# Patient Record
Sex: Male | Born: 1980 | Race: Black or African American | Hispanic: No | Marital: Married | Smoking: Never smoker
Health system: Southern US, Community
[De-identification: ages and names within clinical notes are randomized; demographics above are authoritative.]

## PROBLEM LIST (undated history)

## (undated) DIAGNOSIS — E785 Hyperlipidemia, unspecified: Secondary | ICD-10-CM

## (undated) DIAGNOSIS — J45909 Unspecified asthma, uncomplicated: Secondary | ICD-10-CM

## (undated) DIAGNOSIS — E119 Type 2 diabetes mellitus without complications: Secondary | ICD-10-CM

## (undated) DIAGNOSIS — Z9109 Other allergy status, other than to drugs and biological substances: Secondary | ICD-10-CM

## (undated) DIAGNOSIS — I10 Essential (primary) hypertension: Secondary | ICD-10-CM

## (undated) HISTORY — DX: Other allergy status, other than to drugs and biological substances: Z91.09

## (undated) HISTORY — DX: Unspecified asthma, uncomplicated: J45.909

## (undated) HISTORY — DX: Hyperlipidemia, unspecified: E78.5

## (undated) HISTORY — DX: Essential (primary) hypertension: I10

## (undated) HISTORY — DX: Type 2 diabetes mellitus without complications: E11.9

---

## 2005-01-18 ENCOUNTER — Emergency Department (HOSPITAL_COMMUNITY): Admission: EM | Admit: 2005-01-18 | Discharge: 2005-01-18 | Payer: Self-pay | Admitting: Emergency Medicine

## 2011-03-01 ENCOUNTER — Ambulatory Visit (HOSPITAL_COMMUNITY)
Admission: RE | Admit: 2011-03-01 | Discharge: 2011-03-01 | Disposition: A | Discharge status: Home / Self Care | Payer: BC Managed Care – PPO | Source: Ambulatory Visit | Attending: Obstetrics and Gynecology | Admitting: Obstetrics and Gynecology

## 2011-03-01 DIAGNOSIS — IMO0002 Reserved for concepts with insufficient information to code with codable children: Secondary | ICD-10-CM | POA: Insufficient documentation

## 2011-03-02 ENCOUNTER — Other Ambulatory Visit: Payer: Self-pay | Admitting: Maternal and Fetal Medicine

## 2012-02-07 ENCOUNTER — Ambulatory Visit (INDEPENDENT_AMBULATORY_CARE_PROVIDER_SITE_OTHER): Payer: BC Managed Care – PPO | Admitting: Internal Medicine

## 2012-02-07 ENCOUNTER — Encounter: Payer: Self-pay | Admitting: Internal Medicine

## 2012-02-07 ENCOUNTER — Other Ambulatory Visit (INDEPENDENT_AMBULATORY_CARE_PROVIDER_SITE_OTHER): Payer: BC Managed Care – PPO

## 2012-02-07 VITALS — BP 128/70 | HR 76 | Temp 98.3°F | Resp 16 | Ht 77.0 in | Wt 380.0 lb

## 2012-02-07 DIAGNOSIS — G4733 Obstructive sleep apnea (adult) (pediatric): Secondary | ICD-10-CM | POA: Insufficient documentation

## 2012-02-07 DIAGNOSIS — Z0000 Encounter for general adult medical examination without abnormal findings: Secondary | ICD-10-CM | POA: Insufficient documentation

## 2012-02-07 DIAGNOSIS — Z23 Encounter for immunization: Secondary | ICD-10-CM

## 2012-02-07 DIAGNOSIS — R0683 Snoring: Secondary | ICD-10-CM

## 2012-02-07 DIAGNOSIS — J45998 Other asthma: Secondary | ICD-10-CM | POA: Insufficient documentation

## 2012-02-07 LAB — COMPREHENSIVE METABOLIC PANEL
AST: 22 U/L (ref 0–37)
Albumin: 3.8 g/dL (ref 3.5–5.2)
Alkaline Phosphatase: 93 U/L (ref 39–117)
BUN: 11 mg/dL (ref 6–23)
Potassium: 3.7 mEq/L (ref 3.5–5.1)
Total Bilirubin: 0.5 mg/dL (ref 0.3–1.2)

## 2012-02-07 LAB — LIPID PANEL
Cholesterol: 208 mg/dL — ABNORMAL HIGH (ref 0–200)
HDL: 35 mg/dL — ABNORMAL LOW (ref >39.00)
VLDL: 29.4 mg/dL (ref 0.0–40.0)

## 2012-02-07 LAB — CBC WITH DIFFERENTIAL/PLATELET
Basophils Relative: 0.5 % (ref 0.0–3.0)
Eosinophils Absolute: 0.1 10*3/uL (ref 0.0–0.7)
MCHC: 32.8 g/dL (ref 30.0–36.0)
MCV: 82.6 fl (ref 78.0–100.0)
Monocytes Absolute: 0.5 10*3/uL (ref 0.1–1.0)
Neutrophils Relative %: 56.8 % (ref 43.0–77.0)
Platelets: 260 10*3/uL (ref 150.0–400.0)
RBC: 5.05 Mil/uL (ref 4.22–5.81)

## 2012-02-07 NOTE — Assessment & Plan Note (Signed)
Exam done, vaccines were updated, labs were ordered, pt ed material was given 

## 2012-02-07 NOTE — Assessment & Plan Note (Signed)
pulm referral ? The need for a sleep study

## 2012-02-07 NOTE — Patient Instructions (Addendum)
Health Maintenance, Males A healthy lifestyle and preventative care can promote health and wellness.  Maintain regular health, dental, and eye exams.   Eat a healthy diet. Foods like vegetables, fruits, whole grains, low-fat dairy products, and lean protein foods contain the nutrients you need without too many calories. Decrease your intake of foods high in solid fats, added sugars, and salt. Get information about a proper diet from your caregiver, if necessary.   Regular physical exercise is one of the most important things you can do for your health. Most adults should get at least 150 minutes of moderate-intensity exercise (any activity that increases your heart rate and causes you to sweat) each week. In addition, most adults need muscle-strengthening exercises on 2 or more days a week.    Maintain a healthy weight. The body mass index (BMI) is a screening tool to identify possible weight problems. It provides an estimate of body fat based on height and weight. Your caregiver can help determine your BMI, and can help you achieve or maintain a healthy weight. For adults 20 years and older:   A BMI below 18.5 is considered underweight.   A BMI of 18.5 to 24.9 is normal.   A BMI of 25 to 29.9 is considered overweight.   A BMI of 30 and above is considered obese.   Maintain normal blood lipids and cholesterol by exercising and minimizing your intake of saturated fat. Eat a balanced diet with plenty of fruits and vegetables. Blood tests for lipids and cholesterol should begin at age 20 and be repeated every 5 years. If your lipid or cholesterol levels are high, you are over 50, or you are a high risk for heart disease, you may need your cholesterol levels checked more frequently.Ongoing high lipid and cholesterol levels should be treated with medicines, if diet and exercise are not effective.   If you smoke, find out from your caregiver how to quit. If you do not use tobacco, do not start.    If you choose to drink alcohol, do not exceed 2 drinks per day. One drink is considered to be 12 ounces (355 mL) of beer, 5 ounces (148 mL) of wine, or 1.5 ounces (44 mL) of liquor.   Avoid use of street drugs. Do not share needles with anyone. Ask for help if you need support or instructions about stopping the use of drugs.   High blood pressure causes heart disease and increases the risk of stroke. Blood pressure should be checked at least every 1 to 2 years. Ongoing high blood pressure should be treated with medicines if weight loss and exercise are not effective.   If you are 45 to 30 years old, ask your caregiver if you should take aspirin to prevent heart disease.   Diabetes screening involves taking a blood sample to check your fasting blood sugar level. This should be done once every 3 years, after age 45, if you are within normal weight and without risk factors for diabetes. Testing should be considered at a younger age or be carried out more frequently if you are overweight and have at least 1 risk factor for diabetes.   Colorectal cancer can be detected and often prevented. Most routine colorectal cancer screening begins at the age of 50 and continues through age 75. However, your caregiver may recommend screening at an earlier age if you have risk factors for colon cancer. On a yearly basis, your caregiver may provide home test kits to check for hidden   blood in the stool. Use of a small camera at the end of a tube, to directly examine the colon (sigmoidoscopy or colonoscopy), can detect the earliest forms of colorectal cancer. Talk to your caregiver about this at age 68, when routine screening begins. Direct examination of the colon should be repeated every 5 to 10 years through age 49, unless early forms of pre-cancerous polyps or small growths are found.   Hepatitis C blood testing is recommended for all people born from [birth year] through 1964 and any individual with known risks for  hepatitis C.   Healthy men should no longer receive prostate-specific antigen (PSA) blood tests as part of routine cancer screening. Consult with your caregiver about prostate cancer screening.   Testicular cancer screening is not recommended for adolescents or adult males who have no symptoms. Screening includes self-exam, caregiver exam, and other screening tests. Consult with your caregiver about any symptoms you have or any concerns you have about testicular cancer.   Practice safe sex. Use condoms and avoid high-risk sexual practices to reduce the spread of sexually transmitted infections (STIs).   Use sunscreen with a sun protection factor (SPF) of 30 or greater. Apply sunscreen liberally and repeatedly throughout the day. You should seek shade when your shadow is shorter than you. Protect yourself by wearing long sleeves, pants, a wide-brimmed hat, and sunglasses year round, whenever you are outdoors.   Notify your caregiver of new moles or changes in moles, especially if there is a change in shape or color. Also notify your caregiver if a mole is larger than the size of a pencil eraser.   A one-time screening for abdominal aortic aneurysm (AAA) and surgical repair of large AAAs by sound wave imaging (ultrasonography) is recommended for ages 32 to 59 years who are current or former smokers.   Stay current with your immunizations.  Document Released: 12/15/2007 Document Revised: 06/07/2011 Document Reviewed: 11/13/2010 [facility] Patient Information 2012 [place], [place].Obesity Obesity is defined as having a body mass index (BMI) of 30 or more. To calculate your BMI divide your weight in pounds by your height in inches squared and multiply that product by 703. Major illnesses resulting from long-term obesity include:  Stroke.   Heart disease.   Diabetes.   Many cancers.   Arthritis.  Obesity also complicates recovery from many other medical problems.  CAUSES   A history of obesity  in your parents.   Thyroid hormone imbalance.   Environmental factors such as excess calorie intake and physical inactivity.  TREATMENT  A healthy weight loss program includes:  A calorie restricted diet based on individual calorie needs.   Increased physical activity (exercise).  An exercise program is just as important as the right low-calorie diet.  Weight-loss medicines should be used only under the supervision of your physician. These medicines help, but only if they are used with diet and exercise programs. Medicines can have side effects including nervousness, nausea, abdominal pain, diarrhea, headache, drowsiness, and depression.  An unhealthy weight loss program includes:  Fasting.   Fad diets.   Supplements and drugs.  These choices do not succeed in long-term weight control.  HOME CARE INSTRUCTIONS  To help you make the needed dietary changes:   Exercise and perform physical activity as directed by your caregiver.   Keep a daily record of everything you eat. There are many free websites to help you with this. It may be helpful to measure your foods so you can determine if you are  eating the correct portion sizes.   Use low-calorie cookbooks or take special cooking classes.   Avoid alcohol. Drink more water and drinks with no calories.   Take vitamins and supplements only as recommended by your caregiver.   Weight loss support groups, Registered Dieticians, counselors, and stress reduction education can also be very helpful.  Document Released: 07/26/2004 Document Revised: 06/07/2011 Document Reviewed: 05/25/2007 [facility] Patient Information 2012 [place], [place].

## 2012-02-07 NOTE — Assessment & Plan Note (Signed)
Pneumovax today 

## 2012-02-07 NOTE — Progress Notes (Signed)
  Subjective:    Patient ID: Stephen Hall, male    DOB: 08-31-1980, 31 y.o.   MRN: [id number]  HPI  New to me for a physical, he complains of weight gain.  Review of Systems  Constitutional: Positive for unexpected weight change. Negative for fever, chills, diaphoresis, activity change, appetite change and fatigue.  HENT: Negative.   Eyes: Negative.   Respiratory: Positive for apnea (and snoring). Negative for cough, choking, chest tightness, shortness of breath, wheezing and stridor.   Cardiovascular: Negative for chest pain, palpitations and leg swelling.  Gastrointestinal: Negative.   Genitourinary: Negative.  Negative for scrotal swelling and testicular pain.  Musculoskeletal: Negative.   Skin: Negative.   Neurological: Negative.   Hematological: Negative for adenopathy. Does not bruise/bleed easily.  Psychiatric/Behavioral: Negative.        Objective:   Physical Exam  Vitals reviewed. Constitutional: He is oriented to person, place, and time. He appears well-developed and well-nourished. No distress.  HENT:  Head: Atraumatic.  Mouth/Throat: Oropharynx is clear and moist. No oropharyngeal exudate.  Eyes: Conjunctivae are normal. Right eye exhibits no discharge. Left eye exhibits no discharge. No scleral icterus.  Neck: Normal range of motion. Neck supple. No JVD present. No tracheal deviation present. No thyromegaly present.  Cardiovascular: Normal rate, regular rhythm, normal heart sounds and intact distal pulses.  Exam reveals no gallop and no friction rub.   No murmur heard. Pulmonary/Chest: Effort normal and breath sounds normal. No stridor. No respiratory distress. He has no wheezes. He has no rales. He exhibits no tenderness.  Abdominal: Soft. Bowel sounds are normal. He exhibits no distension and no mass. There is no tenderness. There is no rebound and no guarding. Hernia confirmed negative in the right inguinal area and confirmed negative in the left inguinal area.    Genitourinary: Testes normal and penis normal. Right testis shows no mass, no swelling and no tenderness. Right testis is descended. Left testis shows no swelling and no tenderness. Left testis is descended. Circumcised. No penile erythema or penile tenderness. No discharge found.  Musculoskeletal: Normal range of motion. He exhibits no edema and no tenderness.  Lymphadenopathy:    He has no cervical adenopathy.       Right: No inguinal adenopathy present.       Left: No inguinal adenopathy present.  Neurological: He is oriented to person, place, and time.  Skin: Skin is warm and dry. No rash noted. He is not diaphoretic. No erythema. No pallor.  Psychiatric: He has a normal mood and affect. His behavior is normal. Judgment and thought content normal.          Assessment & Plan:

## 2012-02-14 ENCOUNTER — Other Ambulatory Visit: Payer: Self-pay

## 2012-02-14 DIAGNOSIS — R739 Hyperglycemia, unspecified: Secondary | ICD-10-CM

## 2012-02-15 ENCOUNTER — Ambulatory Visit (INDEPENDENT_AMBULATORY_CARE_PROVIDER_SITE_OTHER): Payer: BC Managed Care – PPO | Admitting: Pulmonary Disease

## 2012-02-15 ENCOUNTER — Encounter: Payer: Self-pay | Admitting: Pulmonary Disease

## 2012-02-15 VITALS — BP 150/90 | HR 88 | Temp 98.5°F | Ht 77.0 in | Wt 365.6 lb

## 2012-02-15 DIAGNOSIS — G4733 Obstructive sleep apnea (adult) (pediatric): Secondary | ICD-10-CM

## 2012-02-15 DIAGNOSIS — R0683 Snoring: Secondary | ICD-10-CM

## 2012-02-15 DIAGNOSIS — R0989 Other specified symptoms and signs involving the circulatory and respiratory systems: Secondary | ICD-10-CM

## 2012-02-15 NOTE — Patient Instructions (Addendum)
Will schedule for a sleep study, and arrange followup to review results when available. Work on weight reduction.

## 2012-02-15 NOTE — Progress Notes (Signed)
  Subjective:    Patient ID: Stephen Hall, male    DOB: 1980-12-05, 31 y.o.   MRN: [id number]  HPI The patient is a 31 year old male who I have been asked to see for possible sleep apnea.  The patient has been noted to have loud snoring by his wife, but she is not mentioned and abnormal breathing pattern during sleep.  The patient has noted an occasional choking arousals.  He feels that his sleep is very restless, and has not rested in the mornings upon arising.  He does note sleep pressure in the afternoon with periods of inactivity, and falls asleep in the early evening watching television if he sits down.  He admits to taking naps in the late afternoon and early evening.  He also has some sleep pressure driving longer distances.  The patient's weight has increased to 50 pounds over the last 2 years, and his Epworth score today is 14.  Sleep Questionnaire: What time do you typically go to bed?( Between what hours) 7pm nap after work then midnight for bed How long does it take you to fall asleep? varies How many times during the night do you wake up? 2 What time do you get out of bed to start your day? 0530 Do you drive or operate heavy machinery in your occupation? No How much has your weight changed (up or down) over the past two years? (In pounds) 50 lb (22.68 kg) Have you ever had a sleep study before? No Do you currently use CPAP? No Do you wear oxygen at any time? No    Review of Systems  Constitutional: Negative for fever and unexpected weight change.  HENT: Negative for ear pain, nosebleeds, congestion, sore throat, rhinorrhea, sneezing, trouble swallowing, dental problem, postnasal drip and sinus pressure.   Eyes: Negative for redness and itching.  Respiratory: Negative for cough, chest tightness, shortness of breath and wheezing.   Cardiovascular: Negative for palpitations and leg swelling.  Gastrointestinal: Negative for nausea and vomiting.  Genitourinary: Negative for dysuria.    Musculoskeletal: Negative for joint swelling.  Skin: Negative for rash.  Neurological: Positive for headaches.  Hematological: Does not bruise/bleed easily.  Psychiatric/Behavioral: Negative for dysphoric mood. The patient is not nervous/anxious.   All other systems reviewed and are negative.       Objective:   Physical Exam Constitutional:  obese, no acute distress  HENT:  Nares patent without discharge, large turbinates  Oropharynx without exudate, palate normal, uvula moderately elongated.   Eyes:  Perrla, eomi, no scleral icterus  Neck:  No JVD, no TMG  Cardiovascular:  Normal rate, regular rhythm, no rubs or gallops.  No murmurs        Intact distal pulses  Pulmonary :  Normal breath sounds, no stridor or respiratory distress   No rales, rhonchi, or wheezing  Abdominal:  Soft, nondistended, bowel sounds present.  No tenderness noted.   Musculoskeletal:  No lower extremity edema noted.  Lymph Nodes:  No cervical lymphadenopathy noted  Skin:  No cyanosis noted  Neurologic:  Alert, appropriate, moves all 4 extremities without obvious deficit.         Assessment & Plan:

## 2012-02-15 NOTE — Assessment & Plan Note (Signed)
The patient's history is very suggestive of clinically significant sleep disordered breathing.  I've had a long discussion with him about sleep apnea, including its impact to his quality of life and cardiovascular health.  I think he needs a sleep study for diagnosis, and also stressed the importance of aggressive weight loss.  I will arrange followup once the results are available.

## 2012-02-20 ENCOUNTER — Telehealth: Payer: Self-pay | Admitting: Pulmonary Disease

## 2012-02-20 NOTE — Telephone Encounter (Signed)
Called and spoke with patient about home sleep study. Advised patient that home study had been denied by [facility]. Advised patient that we could arrange for in lab sleep study at the sleep lab. Patient stated that he wanted to check with [employer] to see how much insurance would pay on the in lab study.Pt stated that he will call me back at [phone] once he speaks with [employer]. Rhonda J Cobb

## 2012-02-20 NOTE — Telephone Encounter (Signed)
Please advise thanks.

## 2012-02-21 NOTE — Telephone Encounter (Signed)
Will route back to [place] to advise when she receives call back from pt.

## 2012-02-26 NOTE — Telephone Encounter (Signed)
Pt stated that he would call us back if he decided to proceed with the sleep study at the sleep center. Rhonda J Cobb

## 2012-02-26 NOTE — Telephone Encounter (Signed)
Stephen Hall, have you heard from pt?

## 2012-02-26 NOTE — Telephone Encounter (Signed)
Called and spoke with patient today, and he stated that he hasn't contacted [employer] yet to see what the benefits are on this study. Pt stated that he would try and call [employer] today and let us know. Will hold phone note open through today. Pt does not have a return appointment scheduled with Dr. Shelle Iron yet. Waiting on patient to decide if he wants to proceed with the study at the sleep center. Rhonda J Cobb

## 2012-05-22 ENCOUNTER — Other Ambulatory Visit (INDEPENDENT_AMBULATORY_CARE_PROVIDER_SITE_OTHER): Payer: BC Managed Care – PPO

## 2012-05-22 ENCOUNTER — Encounter: Payer: Self-pay | Admitting: Internal Medicine

## 2012-05-22 DIAGNOSIS — R739 Hyperglycemia, unspecified: Secondary | ICD-10-CM

## 2012-05-22 DIAGNOSIS — R7309 Other abnormal glucose: Secondary | ICD-10-CM

## 2012-05-22 LAB — COMPREHENSIVE METABOLIC PANEL
Albumin: 4 g/dL (ref 3.5–5.2)
CO2: 27 mEq/L (ref 19–32)
Calcium: 9.1 mg/dL (ref 8.4–10.5)
Chloride: 104 mEq/L (ref 96–112)
GFR: 125.72 mL/min (ref >60.00)
Glucose, Bld: 92 mg/dL (ref 70–99)
Potassium: 4.1 mEq/L (ref 3.5–5.1)
Sodium: 138 mEq/L (ref 135–145)
Total Protein: 8 g/dL (ref 6.0–8.3)

## 2013-02-19 ENCOUNTER — Encounter: Payer: Self-pay | Admitting: Internal Medicine

## 2013-02-19 ENCOUNTER — Telehealth: Payer: Self-pay

## 2013-02-19 ENCOUNTER — Ambulatory Visit (INDEPENDENT_AMBULATORY_CARE_PROVIDER_SITE_OTHER): Payer: BC Managed Care – PPO | Admitting: Internal Medicine

## 2013-02-19 ENCOUNTER — Other Ambulatory Visit (INDEPENDENT_AMBULATORY_CARE_PROVIDER_SITE_OTHER): Payer: BC Managed Care – PPO

## 2013-02-19 ENCOUNTER — Ambulatory Visit (HOSPITAL_COMMUNITY)
Admission: RE | Admit: 2013-02-19 | Discharge: 2013-02-19 | Disposition: A | Discharge status: Home / Self Care | Payer: BC Managed Care – PPO | Source: Ambulatory Visit | Attending: Internal Medicine | Admitting: Internal Medicine

## 2013-02-19 VITALS — BP 118/84 | HR 65 | Temp 98.3°F | Resp 16 | Ht 77.0 in | Wt 362.0 lb

## 2013-02-19 DIAGNOSIS — Z0000 Encounter for general adult medical examination without abnormal findings: Secondary | ICD-10-CM

## 2013-02-19 DIAGNOSIS — M545 Low back pain, unspecified: Secondary | ICD-10-CM | POA: Insufficient documentation

## 2013-02-19 DIAGNOSIS — IMO0002 Reserved for concepts with insufficient information to code with codable children: Secondary | ICD-10-CM

## 2013-02-19 DIAGNOSIS — M5416 Radiculopathy, lumbar region: Secondary | ICD-10-CM

## 2013-02-19 DIAGNOSIS — G4733 Obstructive sleep apnea (adult) (pediatric): Secondary | ICD-10-CM

## 2013-02-19 LAB — URINALYSIS, ROUTINE W REFLEX MICROSCOPIC
Leukocytes, UA: NEGATIVE
Nitrite: NEGATIVE
Total Protein, Urine: NEGATIVE
pH: 6 (ref 5.0–8.0)

## 2013-02-19 LAB — COMPREHENSIVE METABOLIC PANEL
ALT: 47 U/L (ref 0–53)
Alkaline Phosphatase: 86 U/L (ref 39–117)
Creatinine, Ser: 0.9 mg/dL (ref 0.4–1.5)
GFR: 123.53 mL/min (ref >60.00)
Glucose, Bld: 101 mg/dL — ABNORMAL HIGH (ref 70–99)
Sodium: 138 mEq/L (ref 135–145)
Total Bilirubin: 0.4 mg/dL (ref 0.3–1.2)
Total Protein: 7.9 g/dL (ref 6.0–8.3)

## 2013-02-19 LAB — CBC WITH DIFFERENTIAL/PLATELET
Eosinophils Relative: 0.3 % (ref 0.0–5.0)
HCT: 43.4 % (ref 39.0–52.0)
Hemoglobin: 14.5 g/dL (ref 13.0–17.0)
Lymphs Abs: 3.2 10*3/uL (ref 0.7–4.0)
MCV: 79.5 fl (ref 78.0–100.0)
Monocytes Absolute: 0.7 10*3/uL (ref 0.1–1.0)
Monocytes Relative: 5.1 % (ref 3.0–12.0)
Neutro Abs: 9.9 10*3/uL — ABNORMAL HIGH (ref 1.4–7.7)
Platelets: 303 10*3/uL (ref 150.0–400.0)
WBC: 13.9 10*3/uL — ABNORMAL HIGH (ref 4.5–10.5)

## 2013-02-19 LAB — TSH: TSH: 2.32 u[IU]/mL (ref 0.35–5.50)

## 2013-02-19 LAB — LIPID PANEL: Cholesterol: 174 mg/dL (ref 0–200)

## 2013-02-19 MED ORDER — NAPROXEN-ESOMEPRAZOLE 500-20 MG PO TBEC: 1.0000 | DELAYED_RELEASE_TABLET | Freq: Two times a day (BID) | ORAL | Status: DC

## 2013-02-19 NOTE — Assessment & Plan Note (Signed)
Will try vimovo for the pain Will check plain films done today to see if there is DDD, spurring Also I think he has a herniated disc so I have ordered an MRI to get better detail of the discs, nerves, spinal cord

## 2013-02-19 NOTE — Progress Notes (Signed)
Subjective:    Patient ID: Stephen Hall, male    DOB: 06/14/1981, 32 y.o.   MRN: [id number]  Back Pain This is a recurrent problem. The current episode started 1 to 4 weeks ago. The problem occurs constantly. The problem has been gradually improving since onset. The pain is present in the lumbar spine. The quality of the pain is described as aching. The pain radiates to the left thigh. The pain is at a severity of 5/10. The pain is moderate. The pain is worse during the day. The symptoms are aggravated by bending, position, standing and sitting. Stiffness is present all day. Pertinent negatives include no abdominal pain, bladder incontinence, bowel incontinence, chest pain, dysuria, fever, headaches, leg pain, numbness, paresis, paresthesias, pelvic pain, perianal numbness, tingling, weakness or weight loss. Risk factors include obesity, poor posture and sedentary lifestyle. He has tried muscle relaxant and chiropractic manipulation (he has also taken a medrol dose pak and saw an acupuncture doctor) for the symptoms. The treatment provided mild relief.      Review of Systems  Constitutional: Positive for fatigue and unexpected weight change. Negative for fever, chills, weight loss, diaphoresis, activity change and appetite change.  HENT: Negative.   Eyes: Negative.   Respiratory: Positive for apnea. Negative for cough, choking, chest tightness, shortness of breath, wheezing and stridor.   Cardiovascular: Negative.  Negative for chest pain, palpitations and leg swelling.  Gastrointestinal: Negative.  Negative for nausea, vomiting, abdominal pain, diarrhea, constipation and bowel incontinence.  Endocrine: Negative.   Genitourinary: Negative.  Negative for bladder incontinence, dysuria, scrotal swelling, testicular pain and pelvic pain.  Musculoskeletal: Positive for back pain. Negative for myalgias, joint swelling, arthralgias and gait problem.  Skin: Negative.   Allergic/Immunologic: Negative.    Neurological: Negative.  Negative for tingling, weakness, numbness, headaches and paresthesias.  Hematological: Negative.  Negative for adenopathy. Does not bruise/bleed easily.  Psychiatric/Behavioral: Negative.        Objective:   Physical Exam  Vitals reviewed. Constitutional: He is oriented to person, place, and time. He appears well-developed and well-nourished. No distress.  HENT:  Head: Normocephalic and atraumatic.  Mouth/Throat: Oropharynx is clear and moist. No oropharyngeal exudate.  Eyes: Conjunctivae are normal. Right eye exhibits no discharge. Left eye exhibits no discharge. No scleral icterus.  Neck: Normal range of motion. Neck supple. No JVD present. No tracheal deviation present. No thyromegaly present.  Cardiovascular: Normal rate, regular rhythm, normal heart sounds and intact distal pulses.  Exam reveals no gallop and no friction rub.   No murmur heard. Pulmonary/Chest: Effort normal and breath sounds normal. No stridor. No respiratory distress. He has no wheezes. He has no rales. He exhibits no tenderness.  Abdominal: Soft. Bowel sounds are normal. He exhibits no distension and no mass. There is no tenderness. There is no rebound and no guarding. Hernia confirmed negative in the right inguinal area and confirmed negative in the left inguinal area.  Genitourinary: Testes normal and penis normal. Right testis shows no mass, no swelling and no tenderness. Right testis is descended. Left testis shows no mass, no swelling and no tenderness. Left testis is descended. Circumcised. No penile erythema or penile tenderness. No discharge found.  Musculoskeletal: Normal range of motion. He exhibits no edema and no tenderness.       Lumbar back: Normal. He exhibits normal range of motion, no tenderness, no bony tenderness, no swelling, no edema, no deformity, no laceration, no pain, no spasm and normal pulse.  Lymphadenopathy:  He has no cervical adenopathy.       Right: No  inguinal adenopathy present.       Left: No inguinal adenopathy present.  Neurological: He is alert and oriented to person, place, and time. He has normal strength. He displays no atrophy, no tremor and normal reflexes. No cranial nerve deficit or sensory deficit. He exhibits normal muscle tone. He displays a negative Romberg sign. He displays no seizure activity. Coordination and gait normal. He displays no Babinski's sign on the right side. He displays no Babinski's sign on the left side.  Reflex Scores:      Tricep reflexes are 1+ on the right side and 1+ on the left side.      Bicep reflexes are 1+ on the right side and 1+ on the left side.      Brachioradialis reflexes are 1+ on the right side and 1+ on the left side.      Patellar reflexes are 1+ on the right side and 1+ on the left side.      Achilles reflexes are 1+ on the right side and 1+ on the left side. Neg SLR in BLE  Skin: Skin is warm and dry. No rash noted. He is not diaphoretic. No erythema. No pallor.  Psychiatric: He has a normal mood and affect. His behavior is normal. Judgment and thought content normal.      Lab Results  Component Value Date   WBC 7.7 02/07/2012   HGB 13.7 02/07/2012   HCT 41.7 02/07/2012   PLT 260.0 02/07/2012   GLUCOSE 92 05/22/2012   CHOL 208* 02/07/2012   TRIG 147.0 02/07/2012   HDL 35.00* 02/07/2012   LDLDIRECT 158.1 02/07/2012   ALT 27 05/22/2012   AST 18 05/22/2012   NA 138 05/22/2012   K 4.1 05/22/2012   CL 104 05/22/2012   CREATININE 0.9 05/22/2012   BUN 11 05/22/2012   CO2 27 05/22/2012   TSH 1.60 02/07/2012      Assessment & Plan:

## 2013-02-19 NOTE — Assessment & Plan Note (Signed)
Exam done Vaccines were reviewed Labs ordered Pt ed material was given 

## 2013-02-19 NOTE — Assessment & Plan Note (Signed)
He agrees to work on his lifestyle modifications I have asked him to have the sleep apnea evaluated again

## 2013-02-19 NOTE — Patient Instructions (Signed)
Health Maintenance, Males A healthy lifestyle and preventative care can promote health and wellness.  Maintain regular health, dental, and eye exams.  Eat a healthy diet. Foods like vegetables, fruits, whole grains, low-fat dairy products, and lean protein foods contain the nutrients you need without too many calories. Decrease your intake of foods high in solid fats, added sugars, and salt. Get information about a proper diet from your caregiver, if necessary.  Regular physical exercise is one of the most important things you can do for your health. Most adults should get at least 150 minutes of moderate-intensity exercise (any activity that increases your heart rate and causes you to sweat) each week. In addition, most adults need muscle-strengthening exercises on 2 or more days a week.   Maintain a healthy weight. The body mass index (BMI) is a screening tool to identify possible weight problems. It provides an estimate of body fat based on height and weight. Your caregiver can help determine your BMI, and can help you achieve or maintain a healthy weight. For adults 20 years and older:  A BMI below 18.5 is considered underweight.  A BMI of 18.5 to 24.9 is normal.  A BMI of 25 to 29.9 is considered overweight.  A BMI of 30 and above is considered obese.  Maintain normal blood lipids and cholesterol by exercising and minimizing your intake of saturated fat. Eat a balanced diet with plenty of fruits and vegetables. Blood tests for lipids and cholesterol should begin at age 20 and be repeated every 5 years. If your lipid or cholesterol levels are high, you are over 50, or you are a high risk for heart disease, you may need your cholesterol levels checked more frequently.Ongoing high lipid and cholesterol levels should be treated with medicines, if diet and exercise are not effective.  If you smoke, find out from your caregiver how to quit. If you do not use tobacco, do not start.  If you  choose to drink alcohol, do not exceed 2 drinks per day. One drink is considered to be 12 ounces (355 mL) of beer, 5 ounces (148 mL) of wine, or 1.5 ounces (44 mL) of liquor.  Avoid use of street drugs. Do not share needles with anyone. Ask for help if you need support or instructions about stopping the use of drugs.  High blood pressure causes heart disease and increases the risk of stroke. Blood pressure should be checked at least every 1 to 2 years. Ongoing high blood pressure should be treated with medicines if weight loss and exercise are not effective.  If you are 45 to 31 years old, ask your caregiver if you should take aspirin to prevent heart disease.  Diabetes screening involves taking a blood sample to check your fasting blood sugar level. This should be done once every 3 years, after age 45, if you are within normal weight and without risk factors for diabetes. Testing should be considered at a younger age or be carried out more frequently if you are overweight and have at least 1 risk factor for diabetes.  Colorectal cancer can be detected and often prevented. Most routine colorectal cancer screening begins at the age of 50 and continues through age 75. However, your caregiver may recommend screening at an earlier age if you have risk factors for colon cancer. On a yearly basis, your caregiver may provide home test kits to check for hidden blood in the stool. Use of a small camera at the end of a tube,   to directly examine the colon (sigmoidoscopy or colonoscopy), can detect the earliest forms of colorectal cancer. Talk to your caregiver about this at age 50, when routine screening begins. Direct examination of the colon should be repeated every 5 to 10 years through age 75, unless early forms of pre-cancerous polyps or small growths are found.  Hepatitis C blood testing is recommended for all people born from 1945 through 1965 and any individual with known risks for hepatitis C.  Healthy  men should no longer receive prostate-specific antigen (PSA) blood tests as part of routine cancer screening. Consult with your caregiver about prostate cancer screening.  Testicular cancer screening is not recommended for adolescents or adult males who have no symptoms. Screening includes self-exam, caregiver exam, and other screening tests. Consult with your caregiver about any symptoms you have or any concerns you have about testicular cancer.  Practice safe sex. Use condoms and avoid high-risk sexual practices to reduce the spread of sexually transmitted infections (STIs).  Use sunscreen with a sun protection factor (SPF) of 30 or greater. Apply sunscreen liberally and repeatedly throughout the day. You should seek shade when your shadow is shorter than you. Protect yourself by wearing long sleeves, pants, a wide-brimmed hat, and sunglasses year round, whenever you are outdoors.  Notify your caregiver of new moles or changes in moles, especially if there is a change in shape or color. Also notify your caregiver if a mole is larger than the size of a pencil eraser.  A one-time screening for abdominal aortic aneurysm (AAA) and surgical repair of large AAAs by sound wave imaging (ultrasonography) is recommended for ages 65 to 75 years who are current or former smokers.  Stay current with your immunizations. Document Released: 12/15/2007 Document Revised: 09/10/2011 Document Reviewed: 11/13/2010 ExitCare Patient Information 2014 [employer]. Back Pain, Adult Low back pain is very common. About 1 in 5 people have back pain.The cause of low back pain is rarely dangerous. The pain often gets better over time.About half of people with a sudden onset of back pain feel better in just 2 weeks. About 8 in 10 people feel better by 6 weeks.  CAUSES Some common causes of back pain include:  Strain of the muscles or ligaments supporting the spine.  Wear and tear (degeneration) of the spinal  discs.  Arthritis.  Direct injury to the back. DIAGNOSIS Most of the time, the direct cause of low back pain is not known.However, back pain can be treated effectively even when the exact cause of the pain is unknown.Answering your caregiver's questions about your overall health and symptoms is one of the most accurate ways to make sure the cause of your pain is not dangerous. If your caregiver needs more information, he or she may order lab work or imaging tests (X-rays or MRIs).However, even if imaging tests show changes in your back, this usually does not require surgery. HOME CARE INSTRUCTIONS For many people, back pain returns.Since low back pain is rarely dangerous, it is often a condition that people can learn to manageon their own.   Remain active. It is stressful on the back to sit or stand in one place. Do not sit, drive, or stand in one place for more than 30 minutes at a time. Take short walks on level surfaces as soon as pain allows.Try to increase the length of time you walk each day.  Do not stay in bed.Resting more than 1 or 2 days can delay your recovery.  Do not   avoid exercise or work.Your body is made to move.It is not dangerous to be active, even though your back may hurt.Your back will likely heal faster if you return to being active before your pain is gone.  Pay attention to your body when you bend and lift. Many people have less discomfortwhen lifting if they bend their knees, keep the load close to their bodies,and avoid twisting. Often, the most comfortable positions are those that put less stress on your recovering back.  Find a comfortable position to sleep. Use a firm mattress and lie on your side with your knees slightly bent. If you lie on your back, put a pillow under your knees.  Only take over-the-counter or prescription medicines as directed by your caregiver. Over-the-counter medicines to reduce pain and inflammation are often the most  helpful.Your caregiver may prescribe muscle relaxant drugs.These medicines help dull your pain so you can more quickly return to your normal activities and healthy exercise.  Put ice on the injured area.  Put ice in a plastic bag.  Place a towel between your skin and the bag.  Leave the ice on for 15-20 minutes, 3-4 times a day for the first 2 to 3 days. After that, ice and heat may be alternated to reduce pain and spasms.  Ask your caregiver about trying back exercises and gentle massage. This may be of some benefit.  Avoid feeling anxious or stressed.Stress increases muscle tension and can worsen back pain.It is important to recognize when you are anxious or stressed and learn ways to manage it.Exercise is a great option. SEEK MEDICAL CARE IF:  You have pain that is not relieved with rest or medicine.  You have pain that does not improve in 1 week.  You have new symptoms.  You are generally not feeling well. SEEK IMMEDIATE MEDICAL CARE IF:   You have pain that radiates from your back into your legs.  You develop new bowel or bladder control problems.  You have unusual weakness or numbness in your arms or legs.  You develop nausea or vomiting.  You develop abdominal pain.  You feel faint. Document Released: 06/18/2005 Document Revised: 12/18/2011 Document Reviewed: 11/06/2010 ExitCare Patient Information 2014 [employer].  

## 2013-02-19 NOTE — Telephone Encounter (Signed)
Received pharmacy rejection from [facility] stating that insurance will not cover vimovo without a prior authorization. Must call [facility] at [phone]) to initiate PA.

## 2013-02-19 NOTE — Assessment & Plan Note (Signed)
He will be evaluated by sleep med again

## 2013-02-20 ENCOUNTER — Encounter: Payer: Self-pay | Admitting: Internal Medicine

## 2013-02-20 ENCOUNTER — Telehealth: Payer: Self-pay

## 2013-02-20 NOTE — Telephone Encounter (Signed)
Received pharmacy rejection stating that insurance will not cover vimovo  without a prior authorization. PA initiated through [employer] and approval now pending insurance.

## 2013-02-26 ENCOUNTER — Telehealth: Payer: Self-pay

## 2013-02-26 NOTE — Telephone Encounter (Signed)
He has samples. He should let me know if he needs anything else.

## 2013-02-26 NOTE — Telephone Encounter (Signed)
Received fax from [facility] advising Vimovo denied, information forward to MD to suggest alternative. Thanks

## 2013-03-12 ENCOUNTER — Encounter: Payer: Self-pay | Admitting: Internal Medicine

## 2013-03-12 ENCOUNTER — Ambulatory Visit (INDEPENDENT_AMBULATORY_CARE_PROVIDER_SITE_OTHER): Payer: BC Managed Care – PPO | Admitting: Internal Medicine

## 2013-03-12 ENCOUNTER — Ambulatory Visit: Payer: BC Managed Care – PPO | Admitting: Internal Medicine

## 2013-03-12 ENCOUNTER — Other Ambulatory Visit (INDEPENDENT_AMBULATORY_CARE_PROVIDER_SITE_OTHER): Payer: BC Managed Care – PPO

## 2013-03-12 VITALS — BP 130/82 | HR 64 | Wt 372.0 lb

## 2013-03-12 DIAGNOSIS — R7309 Other abnormal glucose: Secondary | ICD-10-CM

## 2013-03-12 DIAGNOSIS — E669 Obesity, unspecified: Secondary | ICD-10-CM | POA: Insufficient documentation

## 2013-03-12 DIAGNOSIS — D72829 Elevated white blood cell count, unspecified: Secondary | ICD-10-CM

## 2013-03-12 DIAGNOSIS — IMO0002 Reserved for concepts with insufficient information to code with codable children: Secondary | ICD-10-CM

## 2013-03-12 DIAGNOSIS — E1169 Type 2 diabetes mellitus with other specified complication: Secondary | ICD-10-CM | POA: Insufficient documentation

## 2013-03-12 DIAGNOSIS — M5416 Radiculopathy, lumbar region: Secondary | ICD-10-CM

## 2013-03-12 LAB — CBC WITH DIFFERENTIAL/PLATELET
Basophils Absolute: 0 10*3/uL (ref 0.0–0.1)
Eosinophils Absolute: 0.1 10*3/uL (ref 0.0–0.7)
HCT: 40.3 % (ref 39.0–52.0)
Hemoglobin: 13.4 g/dL (ref 13.0–17.0)
Lymphs Abs: 2.4 10*3/uL (ref 0.7–4.0)
MCHC: 33.1 g/dL (ref 30.0–36.0)
MCV: 79.7 fl (ref 78.0–100.0)
Monocytes Absolute: 0.5 10*3/uL (ref 0.1–1.0)
Neutro Abs: 5.7 10*3/uL (ref 1.4–7.7)
Platelets: 238 10*3/uL (ref 150.0–400.0)
RDW: 14.7 % — ABNORMAL HIGH (ref 11.5–14.6)

## 2013-03-12 LAB — HEMOGLOBIN A1C: Hgb A1c MFr Bld: 7.5 % — ABNORMAL HIGH (ref 4.6–6.5)

## 2013-03-12 MED ORDER — TRAMADOL HCL 50 MG PO TABS: 50.0000 mg | ORAL_TABLET | Freq: Three times a day (TID) | ORAL | Status: DC | PRN

## 2013-03-12 NOTE — Patient Instructions (Signed)
Back Pain, Adult  Low back pain is very common. About 1 in 5 people have back pain. The cause of low back pain is rarely dangerous. The pain often gets better over time. About half of people with a sudden onset of back pain feel better in just 2 weeks. About 8 in 10 people feel better by 6 weeks.   CAUSES  Some common causes of back pain include:  · Strain of the muscles or ligaments supporting the spine.  · Wear and tear (degeneration) of the spinal discs.  · Arthritis.  · Direct injury to the back.  DIAGNOSIS  Most of the time, the direct cause of low back pain is not known. However, back pain can be treated effectively even when the exact cause of the pain is unknown. Answering your caregiver's questions about your overall health and symptoms is one of the most accurate ways to make sure the cause of your pain is not dangerous. If your caregiver needs more information, he or she may order lab work or imaging tests (X-rays or MRIs). However, even if imaging tests show changes in your back, this usually does not require surgery.  HOME CARE INSTRUCTIONS  For many people, back pain returns. Since low back pain is rarely dangerous, it is often a condition that people can learn to manage on their own.   · Remain active. It is stressful on the back to sit or stand in one place. Do not sit, drive, or stand in one place for more than 30 minutes at a time. Take short walks on level surfaces as soon as pain allows. Try to increase the length of time you walk each day.  · Do not stay in bed. Resting more than 1 or 2 days can delay your recovery.  · Do not avoid exercise or work. Your body is made to move. It is not dangerous to be active, even though your back may hurt. Your back will likely heal faster if you return to being active before your pain is gone.  · Pay attention to your body when you  bend and lift. Many people have less discomfort when lifting if they bend their knees, keep the load close to their bodies, and  avoid twisting. Often, the most comfortable positions are those that put less stress on your recovering back.  · Find a comfortable position to sleep. Use a firm mattress and lie on your side with your knees slightly bent. If you lie on your back, put a pillow under your knees.  · Only take over-the-counter or prescription medicines as directed by your caregiver. Over-the-counter medicines to reduce pain and inflammation are often the most helpful. Your caregiver may prescribe muscle relaxant drugs. These medicines help dull your pain so you can more quickly return to your normal activities and healthy exercise.  · Put ice on the injured area.  · Put ice in a plastic bag.  · Place a towel between your skin and the bag.  · Leave the ice on for 15-20 minutes, 3-4 times a day for the first 2 to 3 days. After that, ice and heat may be alternated to reduce pain and spasms.  · Ask your caregiver about trying back exercises and gentle massage. This may be of some benefit.  · Avoid feeling anxious or stressed. Stress increases muscle tension and can worsen back pain. It is important to recognize when you are anxious or stressed and learn ways to manage it. Exercise is a great option.  SEEK MEDICAL CARE IF:  · You have pain that is not relieved with rest or   medicine.  · You have pain that does not improve in 1 week.  · You have new symptoms.  · You are generally not feeling well.  SEEK IMMEDIATE MEDICAL CARE IF:   · You have pain that radiates from your back into your legs.  · You develop new bowel or bladder control problems.  · You have unusual weakness or numbness in your arms or legs.  · You develop nausea or vomiting.  · You develop abdominal pain.  · You feel faint.  Document Released: 06/18/2005 Document Revised: 12/18/2011 Document Reviewed: 11/06/2010  ExitCare® Patient Information ©2014 [employer].

## 2013-03-12 NOTE — Progress Notes (Signed)
Subjective:    Patient ID: Stephen Hall, male    DOB: Nov 27, 1980, 32 y.o.   MRN: [id number]  Back Pain This is a recurrent problem. The current episode started more than 1 month ago. The problem occurs constantly. The problem is unchanged. The pain is present in the lumbar spine. The quality of the pain is described as shooting and stabbing. The pain does not radiate. The pain is at a severity of 5/10. The pain is moderate. The symptoms are aggravated by bending, standing, position and twisting. Pertinent negatives include no abdominal pain, bladder incontinence, bowel incontinence, chest pain, dysuria, fever, headaches, leg pain, numbness, paresis, paresthesias, pelvic pain, perianal numbness, tingling, weakness or weight loss. Risk factors include obesity, poor posture and lack of exercise. He has tried NSAIDs for the symptoms. The treatment provided mild relief.      Review of Systems  Constitutional: Positive for diaphoresis (he complains of night sweats). Negative for fever, chills, weight loss, activity change, appetite change, fatigue and unexpected weight change.  HENT: Negative.   Eyes: Negative.   Respiratory: Negative.  Negative for cough, chest tightness, shortness of breath, wheezing and stridor.   Cardiovascular: Negative.  Negative for chest pain, palpitations and leg swelling.  Gastrointestinal: Negative.  Negative for nausea, vomiting, abdominal pain, diarrhea, constipation, blood in stool and bowel incontinence.  Endocrine: Negative.   Genitourinary: Negative.  Negative for bladder incontinence, dysuria, hematuria, flank pain, enuresis and pelvic pain.  Musculoskeletal: Positive for back pain. Negative for myalgias, joint swelling, arthralgias and gait problem.  Skin: Negative.   Allergic/Immunologic: Negative.   Neurological: Negative.  Negative for dizziness, tingling, tremors, seizures, syncope, facial asymmetry, speech difficulty, weakness, light-headedness, numbness,  headaches and paresthesias.  Hematological: Negative.  Negative for adenopathy. Does not bruise/bleed easily.  Psychiatric/Behavioral: Negative.        Objective:   Physical Exam  Vitals reviewed. Constitutional: He is oriented to person, place, and time. He appears well-developed and well-nourished. No distress.  HENT:  Head: Normocephalic and atraumatic.  Mouth/Throat: Oropharynx is clear and moist. No oropharyngeal exudate.  Eyes: Conjunctivae are normal. Right eye exhibits no discharge. Left eye exhibits no discharge. No scleral icterus.  Neck: Normal range of motion. Neck supple. No JVD present. No tracheal deviation present. No thyromegaly present.  Cardiovascular: Normal rate, regular rhythm, normal heart sounds and intact distal pulses.  Exam reveals no gallop and no friction rub.   No murmur heard. Pulmonary/Chest: Effort normal and breath sounds normal. No stridor. No respiratory distress. He has no wheezes. He has no rales. He exhibits no tenderness.  Abdominal: Soft. Bowel sounds are normal. He exhibits no distension and no mass. There is no tenderness. There is no rebound and no guarding.  Musculoskeletal: Normal range of motion. He exhibits no edema and no tenderness.       Lumbar back: Normal. He exhibits normal range of motion, no tenderness, no bony tenderness, no swelling, no edema, no deformity, no laceration, no pain, no spasm and normal pulse.  Lymphadenopathy:    He has no cervical adenopathy.  Neurological: He is alert and oriented to person, place, and time. He has normal strength. He displays no atrophy, no tremor and normal reflexes. No cranial nerve deficit or sensory deficit. He exhibits normal muscle tone. He displays a negative Romberg sign. He displays no seizure activity. Coordination and gait normal. He displays no Babinski's sign on the right side. He displays no Babinski's sign on the left side.  Reflex Scores:  Tricep reflexes are 1+ on the right side  and 1+ on the left side.      Bicep reflexes are 1+ on the right side and 1+ on the left side.      Brachioradialis reflexes are 1+ on the right side and 1+ on the left side.      Patellar reflexes are 1+ on the right side and 1+ on the left side.      Achilles reflexes are 1+ on the right side and 1+ on the left side. Neg SLR in BLE  Skin: Skin is warm and dry. No rash noted. He is not diaphoretic. No erythema. No pallor.  Psychiatric: He has a normal mood and affect. His behavior is normal. Judgment and thought content normal.          Assessment & Plan:

## 2013-03-13 ENCOUNTER — Encounter: Payer: Self-pay | Admitting: Internal Medicine

## 2013-03-13 LAB — BASIC METABOLIC PANEL
CO2: 26 mEq/L (ref 19–32)
Calcium: 8.8 mg/dL (ref 8.4–10.5)
Creatinine, Ser: 1 mg/dL (ref 0.4–1.5)
GFR: 109.62 mL/min (ref >60.00)
Sodium: 136 mEq/L (ref 135–145)

## 2013-03-13 LAB — SEDIMENTATION RATE: Sed Rate: 33 mm/hr — ABNORMAL HIGH (ref 0–22)

## 2013-03-13 NOTE — Assessment & Plan Note (Signed)
Today I will recheck his CBC and will also look at his ESR and SPEP to see if he has a lymphoproliferative disease, systemic inflammation, infection

## 2013-03-13 NOTE — Assessment & Plan Note (Signed)
I will check his A1C today to see if he has developed DM2 

## 2013-03-13 NOTE — Assessment & Plan Note (Signed)
He needs better relief from the pain so I have asked him to try tramadol in addition to the nsaid His plain film was normal but he has systemic symptoms and abnormal blood work so I have ordered an MRI to get better detail of the discs, vertebrae, nerves, and spinal cord.

## 2013-03-16 LAB — PROTEIN ELECTROPHORESIS, SERUM
Alpha-2-Globulin: 15 % — ABNORMAL HIGH (ref 7.1–11.8)
Gamma Globulin: 13.6 % (ref 11.1–18.8)
Total Protein, Serum Electrophoresis: 6.6 g/dL (ref 6.0–8.3)

## 2013-03-21 ENCOUNTER — Inpatient Hospital Stay: Admission: RE | Admit: 2013-03-21 | Payer: BC Managed Care – PPO | Source: Ambulatory Visit

## 2013-03-23 ENCOUNTER — Telehealth: Payer: Self-pay | Admitting: *Deleted

## 2013-03-23 ENCOUNTER — Other Ambulatory Visit: Payer: Self-pay | Admitting: Internal Medicine

## 2013-03-23 NOTE — Telephone Encounter (Signed)
Pt called requesting to have his labs redrawn.  States he was not aware he was to be fasting prior to having labs drawn.  Please advise

## 2013-03-23 NOTE — Telephone Encounter (Signed)
Fasting or non-fasting is irrelevant His A1C (a 30 day average blood sugar) was elevated

## 2013-03-23 NOTE — Telephone Encounter (Signed)
Spoke with pt advised of MDs message 

## 2013-03-23 NOTE — Telephone Encounter (Signed)
Left message for pt to return call.

## 2013-03-24 ENCOUNTER — Ambulatory Visit (INDEPENDENT_AMBULATORY_CARE_PROVIDER_SITE_OTHER): Payer: BC Managed Care – PPO | Admitting: Pulmonary Disease

## 2013-03-24 ENCOUNTER — Encounter: Payer: Self-pay | Admitting: Pulmonary Disease

## 2013-03-24 VITALS — BP 140/90 | HR 89 | Temp 97.9°F | Ht 77.0 in | Wt 372.4 lb

## 2013-03-24 DIAGNOSIS — G4733 Obstructive sleep apnea (adult) (pediatric): Secondary | ICD-10-CM

## 2013-03-24 NOTE — Assessment & Plan Note (Signed)
The patient's history is very suggestive of clinically significant sleep apnea.  I have had a long discussion with him about the pathophysiology of sleep apnea, including its impacted his quality of life and cardiovascular health.  At this point, he will need a sleep study for evaluation, and I believe he is an excellent candidate for home sleep testing.  I will arrange for followup once the results are available.

## 2013-03-24 NOTE — Patient Instructions (Addendum)
Will arrange for home sleep testing, and will call once results are available Work on weight loss.  

## 2013-03-24 NOTE — Progress Notes (Signed)
  Subjective:    Patient ID: Stephen Hall, male    DOB: Oct 07, 1980, 32 y.o.   MRN: [id number]  HPI The patient is a 32 year old male who I've been asked to see for possible obstructive sleep apnea.  He has been noted to have loud snoring, but no one has commented directly on an abnormal breathing pattern during sleep.  The patient awakens at least 2 times a night, and does not feel rested in the mornings upon arising.  He notes definite inappropriate daytime sleepiness, and often falls asleep at his desk while working.  He also will fall asleep easily in the evenings watching television or movies, and has some slight pressure with driving.  Patient states that his weight is up about 25 pounds over the last 2 years, and his Epworth score today is 12.    Sleep Questionnaire What time do you typically go to bed?( Between what hours) 10 - 12pm 10 - 12pm at 1440 on 03/24/13 by Hermelinda Medicus How long does it take you to fall asleep? 30 mins 30 mins at 1440 on 03/24/13 by Hermelinda Medicus How many times during the night do you wake up? 2 2 at 1440 on 03/24/13 by Hermelinda Medicus What time do you get out of bed to start your day? 0530 0530 at 1440 on 03/24/13 by Hermelinda Medicus Do you drive or operate heavy machinery in your occupation? No No at 1440 on 03/24/13 by Hermelinda Medicus How much has your weight changed (up or down) over the past two years? (In pounds) 25 lb (11.34 kg) 25 lb (11.34 kg) at 1440 on 03/24/13 by Hermelinda Medicus Have you ever had a sleep study before? No No at 1440 on 03/24/13 by Hermelinda Medicus Do you currently use CPAP? No No at 1440 on 03/24/13 by Hermelinda Medicus Do you wear oxygen at any time? No    Review of Systems  Constitutional: Negative for fever and unexpected weight change.  HENT: Positive for congestion and postnasal drip. Negative for ear pain, nosebleeds, sore throat, rhinorrhea, sneezing, trouble swallowing, dental problem and  sinus pressure.   Eyes: Negative for redness and itching.  Respiratory: Negative for cough, chest tightness, shortness of breath and wheezing.   Cardiovascular: Negative for palpitations and leg swelling.  Gastrointestinal: Negative for nausea and vomiting.  Genitourinary: Negative for dysuria.  Musculoskeletal: Negative for joint swelling.  Skin: Negative for rash.  Neurological: Positive for headaches.  Hematological: Does not bruise/bleed easily.  Psychiatric/Behavioral: Negative for dysphoric mood. The patient is not nervous/anxious.        Objective:   Physical Exam Constitutional:  Obese male, no acute distress  HENT:  Nares patent without discharge  Oropharynx without exudate, palate and uvula are mildly elongated.   Eyes:  Perrla, eomi, no scleral icterus  Neck:  No JVD, no TMG  Cardiovascular:  Normal rate, regular rhythm, no rubs or gallops.  No murmurs        Intact distal pulses  Pulmonary :  Normal breath sounds, no stridor or respiratory distress   No rales, rhonchi, or wheezing  Abdominal:  Soft, nondistended, bowel sounds present.  No tenderness noted.   Musculoskeletal:  No lower extremity edema noted.  Lymph Nodes:  No cervical lymphadenopathy noted  Skin:  No cyanosis noted  Neurologic:  Alert, appropriate, moves all 4 extremities without obvious deficit.         Assessment & Plan:

## 2013-03-30 ENCOUNTER — Telehealth: Payer: Self-pay | Admitting: Pulmonary Disease

## 2013-03-30 NOTE — Telephone Encounter (Signed)
lmom for kya for cpt code 40981 to be used for home sleep study Tobe Sos

## 2013-04-03 ENCOUNTER — Other Ambulatory Visit: Payer: Self-pay | Admitting: Pulmonary Disease

## 2013-04-03 DIAGNOSIS — G4733 Obstructive sleep apnea (adult) (pediatric): Secondary | ICD-10-CM

## 2013-05-05 ENCOUNTER — Ambulatory Visit (HOSPITAL_BASED_OUTPATIENT_CLINIC_OR_DEPARTMENT_OTHER): Payer: BC Managed Care – PPO | Attending: Pulmonary Disease | Admitting: Radiology

## 2013-05-05 VITALS — Ht 77.0 in | Wt 370.0 lb

## 2013-05-05 DIAGNOSIS — G4733 Obstructive sleep apnea (adult) (pediatric): Secondary | ICD-10-CM

## 2013-05-05 DIAGNOSIS — G4710 Hypersomnia, unspecified: Secondary | ICD-10-CM | POA: Insufficient documentation

## 2013-05-14 ENCOUNTER — Telehealth: Payer: Self-pay | Admitting: Pulmonary Disease

## 2013-05-14 DIAGNOSIS — G4710 Hypersomnia, unspecified: Secondary | ICD-10-CM

## 2013-05-14 DIAGNOSIS — G4733 Obstructive sleep apnea (adult) (pediatric): Secondary | ICD-10-CM

## 2013-05-14 DIAGNOSIS — G4730 Sleep apnea, unspecified: Secondary | ICD-10-CM

## 2013-05-14 NOTE — Telephone Encounter (Signed)
Please let pt know that his sleep study does not show he has sleep apnea.  He did have snoring which disrupted his sleep.  He needs to work on getting his weight down, and to try to stay off his back while sleeping.  If he has worsening symptoms, he is to call us.

## 2013-05-14 NOTE — Telephone Encounter (Signed)
Pt aware of results and recs per KC.

## 2013-05-15 NOTE — Procedures (Signed)
Stephen Hall, Stephen Hall              ACCOUNT NO.:  [id number]  MEDICAL RECORD NO.:  [id number]          PATIENT TYPE:  OUT  LOCATION:  SLEEP CENTER                 FACILITY:  [facility]  PHYSICIAN:  Barbaraann Share, MD,FCCPDATE OF BIRTH:  05-02-82  DATE OF STUDY:  05/05/2013                           NOCTURNAL POLYSOMNOGRAM  REFERRING PHYSICIAN:  Barbaraann Share, MD,FCCP  INDICATION FOR STUDY:  Hypersomnia with sleep apnea.  EPWORTH SLEEPINESS SCORE:  12.  MEDICATIONS:  SLEEP ARCHITECTURE:  The patient had a total sleep time of 336 minutes with no slow-wave sleep and 88 minutes of REM.  Sleep onset latency was normal at 6 minutes, and REM onset was normal at 67 minutes.  Sleep efficiency was mildly decreased at 89%.  RESPIRATORY DATA:  The patient was found to have no obstructive apneas but 17 obstructive hypopneas, giving him an apnea-hypopnea index of 3 events per hour.  The events were increased in the supine position and also during REM, and there was mild to moderate snoring noted throughout.  OXYGEN DATA:  There was O2 desaturation as low as 90% with the patient's obstructive events.  CARDIAC DATA:  No clinically significant arrhythmias were noted.  MOVEMENT-PARASOMNIA:  The patient had no significant leg jerks or other abnormal behavior seen.  IMPRESSIONS-RECOMMENDATIONS:  Small numbers of obstructive events which do not meet the AHI criteria for the obstructive sleep apnea syndrome. The patient should be encouraged to work aggressively on weight loss, and also avoid supine position during sleeping.     Barbaraann Share, MD,FCCP Diplomate, American Board of Sleep Medicine    [facility]  D:  05/14/2013 08:38:27  T:  05/15/2013 00:27:19  Job:  [id number]

## 2013-11-18 LAB — HM DIABETES EYE EXAM

## 2014-01-06 ENCOUNTER — Telehealth: Payer: Self-pay | Admitting: *Deleted

## 2014-01-06 DIAGNOSIS — IMO0001 Reserved for inherently not codable concepts without codable children: Secondary | ICD-10-CM

## 2014-01-06 DIAGNOSIS — E1165 Type 2 diabetes mellitus with hyperglycemia: Principal | ICD-10-CM

## 2014-01-06 NOTE — Telephone Encounter (Signed)
Patient is scheduling an appointment for his yearly physical. Lipid, a1c, bmet Diabetic bundle

## 2014-02-11 ENCOUNTER — Telehealth: Payer: Self-pay | Admitting: Internal Medicine

## 2014-02-11 NOTE — Telephone Encounter (Signed)
Left voicemail for patient to call back to schedule CPE.  Last CPE was 02/19/2013.

## 2014-06-04 ENCOUNTER — Ambulatory Visit (INDEPENDENT_AMBULATORY_CARE_PROVIDER_SITE_OTHER): Payer: BC Managed Care – PPO | Admitting: Internal Medicine

## 2014-06-04 ENCOUNTER — Encounter: Payer: Self-pay | Admitting: Internal Medicine

## 2014-06-04 VITALS — BP 130/80 | HR 78 | Temp 98.3°F | Resp 16 | Ht 77.0 in | Wt 363.0 lb

## 2014-06-04 DIAGNOSIS — J202 Acute bronchitis due to streptococcus: Secondary | ICD-10-CM

## 2014-06-04 MED ORDER — HYDROCOD POLST-CPM POLST ER 10-8 MG PO CP12: 1.0000 | ORAL_CAPSULE | Freq: Two times a day (BID) | ORAL | Status: DC | PRN

## 2014-06-04 MED ORDER — AZITHROMYCIN 500 MG PO TABS
500.0000 mg | ORAL_TABLET | Freq: Every day | ORAL | Status: DC
Start: 2014-06-04 — End: 2014-07-09

## 2014-06-04 NOTE — Progress Notes (Signed)
Pre visit review using our clinic review tool, if applicable. No additional management support is needed unless otherwise documented below in the visit note. 

## 2014-06-04 NOTE — Patient Instructions (Signed)

## 2014-06-05 DIAGNOSIS — J202 Acute bronchitis due to streptococcus: Secondary | ICD-10-CM | POA: Insufficient documentation

## 2014-06-05 NOTE — Assessment & Plan Note (Signed)
I will treat the infection with zithromax and will control the cough with tussicaps 

## 2014-06-05 NOTE — Progress Notes (Signed)
   Subjective:    Patient ID: Stephen Hall, male    DOB: 05-21-1981, 33 y.o.   MRN: [id number]  Cough This is a new problem. The current episode started in the past 7 days. The problem has been unchanged. The problem occurs every few hours. The cough is productive of purulent sputum. Associated symptoms include postnasal drip and rhinorrhea. Pertinent negatives include no chest pain, chills, ear congestion, ear pain, fever, headaches, heartburn, hemoptysis, myalgias, nasal congestion, rash, sore throat, shortness of breath, sweats, weight loss or wheezing. Nothing aggravates the symptoms. He has tried OTC cough suppressant for the symptoms. The treatment provided no relief.      Review of Systems  Constitutional: Negative.  Negative for fever, chills, weight loss, diaphoresis and fatigue.  HENT: Positive for postnasal drip, rhinorrhea and sinus pressure. Negative for ear pain, sore throat and trouble swallowing.   Eyes: Negative.   Respiratory: Positive for cough. Negative for apnea, hemoptysis, choking, chest tightness, shortness of breath, wheezing and stridor.   Cardiovascular: Negative.  Negative for chest pain, palpitations and leg swelling.  Gastrointestinal: Negative.  Negative for heartburn, nausea, vomiting, abdominal pain, diarrhea, constipation and blood in stool.  Endocrine: Negative.   Genitourinary: Negative.   Musculoskeletal: Negative.  Negative for myalgias.  Skin: Negative.  Negative for rash.  Allergic/Immunologic: Negative.   Neurological: Negative.  Negative for headaches.  Hematological: Negative.  Negative for adenopathy. Does not bruise/bleed easily.  Psychiatric/Behavioral: Negative.        Objective:   Physical Exam  Constitutional: He is oriented to person, place, and time. He appears well-developed and well-nourished.  Non-toxic appearance. He does not have a sickly appearance. He does not appear ill. No distress.  HENT:  Head: Normocephalic and  atraumatic.  Mouth/Throat: Oropharynx is clear and moist. No oropharyngeal exudate.  Eyes: Conjunctivae are normal. Right eye exhibits no discharge. Left eye exhibits no discharge. No scleral icterus.  Neck: Normal range of motion. Neck supple. No JVD present. No tracheal deviation present. No thyromegaly present.  Cardiovascular: Normal rate, regular rhythm, normal heart sounds and intact distal pulses.  Exam reveals no gallop and no friction rub.   No murmur heard. Pulmonary/Chest: Effort normal and breath sounds normal. No stridor. No respiratory distress. He has no decreased breath sounds. He has no wheezes. He has no rhonchi. He has no rales. He exhibits no tenderness.  Abdominal: Soft. Bowel sounds are normal. He exhibits no distension and no mass. There is no tenderness. There is no rebound and no guarding.  Musculoskeletal: Normal range of motion. He exhibits no edema or tenderness.  Lymphadenopathy:    He has no cervical adenopathy.  Neurological: He is oriented to person, place, and time.  Skin: Skin is warm and dry. No rash noted. He is not diaphoretic. No erythema. No pallor.  Vitals reviewed.         Assessment & Plan:

## 2014-07-09 ENCOUNTER — Encounter: Payer: Self-pay | Admitting: Internal Medicine

## 2014-07-09 ENCOUNTER — Ambulatory Visit (INDEPENDENT_AMBULATORY_CARE_PROVIDER_SITE_OTHER): Payer: BC Managed Care – PPO | Admitting: Internal Medicine

## 2014-07-09 ENCOUNTER — Other Ambulatory Visit (INDEPENDENT_AMBULATORY_CARE_PROVIDER_SITE_OTHER): Payer: BC Managed Care – PPO

## 2014-07-09 VITALS — BP 130/84 | HR 77 | Temp 98.3°F | Resp 16 | Ht 77.0 in | Wt 359.0 lb

## 2014-07-09 DIAGNOSIS — E119 Type 2 diabetes mellitus without complications: Secondary | ICD-10-CM

## 2014-07-09 DIAGNOSIS — Z0000 Encounter for general adult medical examination without abnormal findings: Secondary | ICD-10-CM

## 2014-07-09 DIAGNOSIS — G4700 Insomnia, unspecified: Secondary | ICD-10-CM

## 2014-07-09 DIAGNOSIS — E669 Obesity, unspecified: Secondary | ICD-10-CM

## 2014-07-09 DIAGNOSIS — E1165 Type 2 diabetes mellitus with hyperglycemia: Secondary | ICD-10-CM

## 2014-07-09 DIAGNOSIS — E1169 Type 2 diabetes mellitus with other specified complication: Secondary | ICD-10-CM

## 2014-07-09 DIAGNOSIS — IMO0002 Reserved for concepts with insufficient information to code with codable children: Secondary | ICD-10-CM

## 2014-07-09 LAB — COMPREHENSIVE METABOLIC PANEL
ALT: 20 U/L (ref 0–53)
AST: 16 U/L (ref 0–37)
Albumin: 4 g/dL (ref 3.5–5.2)
Alkaline Phosphatase: 87 U/L (ref 39–117)
BUN: 14 mg/dL (ref 6–23)
CO2: 23 meq/L (ref 19–32)
CREATININE: 0.9 mg/dL (ref 0.4–1.5)
Calcium: 9.1 mg/dL (ref 8.4–10.5)
Chloride: 106 mEq/L (ref 96–112)
GFR: 122.44 mL/min (ref >60.00)
Glucose, Bld: 133 mg/dL — ABNORMAL HIGH (ref 70–99)
POTASSIUM: 4.2 meq/L (ref 3.5–5.1)
SODIUM: 139 meq/L (ref 135–145)
TOTAL PROTEIN: 7.7 g/dL (ref 6.0–8.3)
Total Bilirubin: 0.4 mg/dL (ref 0.2–1.2)

## 2014-07-09 LAB — BASIC METABOLIC PANEL
BUN: 14 mg/dL (ref 6–23)
CHLORIDE: 106 meq/L (ref 96–112)
CO2: 23 mEq/L (ref 19–32)
CREATININE: 0.9 mg/dL (ref 0.4–1.5)
Calcium: 9.1 mg/dL (ref 8.4–10.5)
GFR: 122.44 mL/min (ref >60.00)
GLUCOSE: 133 mg/dL — AB (ref 70–99)
Potassium: 4.2 mEq/L (ref 3.5–5.1)
Sodium: 139 mEq/L (ref 135–145)

## 2014-07-09 LAB — URINALYSIS, ROUTINE W REFLEX MICROSCOPIC
Bilirubin Urine: NEGATIVE
Hgb urine dipstick: NEGATIVE
KETONES UR: NEGATIVE
Leukocytes, UA: NEGATIVE
NITRITE: NEGATIVE
PH: 6 (ref 5.0–8.0)
SPECIFIC GRAVITY, URINE: 1.025 (ref 1.000–1.030)
TOTAL PROTEIN, URINE-UPE24: NEGATIVE
URINE GLUCOSE: NEGATIVE
Urobilinogen, UA: 0.2 (ref 0.0–1.0)

## 2014-07-09 LAB — CBC WITH DIFFERENTIAL/PLATELET
BASOS PCT: 0.7 % (ref 0.0–3.0)
Basophils Absolute: 0.1 10*3/uL (ref 0.0–0.1)
Eosinophils Absolute: 0.1 10*3/uL (ref 0.0–0.7)
Eosinophils Relative: 0.9 % (ref 0.0–5.0)
HCT: 42.7 % (ref 39.0–52.0)
Hemoglobin: 14 g/dL (ref 13.0–17.0)
LYMPHS PCT: 39.1 % (ref 12.0–46.0)
Lymphs Abs: 3.1 10*3/uL (ref 0.7–4.0)
MCHC: 32.8 g/dL (ref 30.0–36.0)
MCV: 79.5 fl (ref 78.0–100.0)
MONO ABS: 0.5 10*3/uL (ref 0.1–1.0)
Monocytes Relative: 6.7 % (ref 3.0–12.0)
NEUTROS ABS: 4.2 10*3/uL (ref 1.4–7.7)
Neutrophils Relative %: 52.6 % (ref 43.0–77.0)
Platelets: 275 10*3/uL (ref 150.0–400.0)
RBC: 5.37 Mil/uL (ref 4.22–5.81)
RDW: 14 % (ref 11.5–15.5)
WBC: 8 10*3/uL (ref 4.0–10.5)

## 2014-07-09 LAB — LIPID PANEL
CHOL/HDL RATIO: 8
Cholesterol: 225 mg/dL — ABNORMAL HIGH (ref 0–200)
HDL: 27.3 mg/dL — ABNORMAL LOW (ref >39.00)
LDL Cholesterol: 178 mg/dL — ABNORMAL HIGH (ref 0–99)
NonHDL: 197.7
TRIGLYCERIDES: 100 mg/dL (ref 0.0–149.0)
VLDL: 20 mg/dL (ref 0.0–40.0)

## 2014-07-09 LAB — TSH: TSH: 2.04 u[IU]/mL (ref 0.35–4.50)

## 2014-07-09 LAB — HEMOGLOBIN A1C: HEMOGLOBIN A1C: 8.4 % — AB (ref 4.6–6.5)

## 2014-07-09 LAB — MICROALBUMIN / CREATININE URINE RATIO
Creatinine,U: 307.9 mg/dL
MICROALB UR: 1.1 mg/dL (ref 0.0–1.9)
Microalb Creat Ratio: 0.4 mg/g (ref 0.0–30.0)

## 2014-07-09 LAB — GLUCOSE, POCT (MANUAL RESULT ENTRY): POC GLUCOSE: 132 mg/dL — AB (ref 70–99)

## 2014-07-09 MED ORDER — DOXEPIN HCL 3 MG PO TABS: 1.0000 | ORAL_TABLET | Freq: Every evening | ORAL | Status: DC | PRN

## 2014-07-09 MED ORDER — CANAGLIFLOZIN-METFORMIN HCL 50-1000 MG PO TABS: 1.0000 | ORAL_TABLET | Freq: Two times a day (BID) | ORAL | Status: DC

## 2014-07-09 MED ORDER — EXENATIDE ER 2 MG ~~LOC~~ SUSR: 2.0000 mg | SUBCUTANEOUS | Status: DC

## 2014-07-09 NOTE — Progress Notes (Signed)
Subjective:    Patient ID: Stephen Hall, male    DOB: Mar 03, 1981, 34 y.o.   MRN: [id number]  Diabetes He presents for his follow-up diabetic visit. He has type 2 diabetes mellitus. His disease course has been worsening. There are no hypoglycemic associated symptoms. Pertinent negatives for hypoglycemia include no dizziness or nervousness/anxiousness. There are no diabetic associated symptoms. Pertinent negatives for diabetes include no blurred vision, no chest pain, no fatigue, no foot paresthesias, no foot ulcerations, no polydipsia, no polyphagia, no polyuria, no visual change, no weakness and no weight loss. There are no hypoglycemic complications. Symptoms are stable. There are no diabetic complications. Current diabetic treatment includes diet. He is compliant with treatment none of the time. His weight is increasing steadily. He is following a generally unhealthy diet. When asked about meal planning, he reported none. He participates in exercise intermittently. There is no change in his home blood glucose trend. An ACE inhibitor/angiotensin II receptor blocker is not being taken. He does not see a podiatrist.Eye exam is not current.      Review of Systems  Constitutional: Negative.  Negative for fever, chills, weight loss, diaphoresis, appetite change and fatigue.  HENT: Negative.   Eyes: Negative.  Negative for blurred vision.  Respiratory: Negative.  Negative for cough, choking, chest tightness, shortness of breath and stridor.   Cardiovascular: Negative.  Negative for chest pain, palpitations and leg swelling.  Gastrointestinal: Negative.  Negative for nausea, vomiting, abdominal pain, diarrhea, constipation and blood in stool.  Endocrine: Negative.  Negative for polydipsia, polyphagia and polyuria.  Genitourinary: Negative.   Musculoskeletal: Negative.  Negative for myalgias, back pain, joint swelling and arthralgias.  Skin: Negative.  Negative for rash.  Allergic/Immunologic:  Negative.   Neurological: Negative.  Negative for dizziness and weakness.  Hematological: Negative.  Negative for adenopathy. Does not bruise/bleed easily.  Psychiatric/Behavioral: Positive for sleep disturbance (FA's). Negative for suicidal ideas, dysphoric mood and decreased concentration. The patient is not nervous/anxious.        Objective:   Physical Exam  Constitutional: He is oriented to person, place, and time. He appears well-developed and well-nourished. No distress.  HENT:  Head: Normocephalic and atraumatic.  Mouth/Throat: Oropharynx is clear and moist. No oropharyngeal exudate.  Eyes: Conjunctivae are normal. Right eye exhibits no discharge. Left eye exhibits no discharge. No scleral icterus.  Neck: Normal range of motion. Neck supple. No JVD present. No tracheal deviation present. No thyromegaly present.  Cardiovascular: Normal rate, regular rhythm, normal heart sounds and intact distal pulses.  Exam reveals no gallop and no friction rub.   No murmur heard. Pulmonary/Chest: Effort normal and breath sounds normal. No stridor. No respiratory distress. He has no wheezes. He has no rales. He exhibits no tenderness.  Abdominal: Soft. Bowel sounds are normal. He exhibits no distension and no mass. There is no tenderness. There is no rebound and no guarding.  Musculoskeletal: Normal range of motion. He exhibits no edema or tenderness.  Lymphadenopathy:    He has no cervical adenopathy.  Neurological: He is oriented to person, place, and time.  Skin: Skin is warm and dry. No rash noted. He is not diaphoretic. No erythema. No pallor.  Vitals reviewed.    Lab Results  Component Value Date   WBC 8.9 03/12/2013   HGB 13.4 03/12/2013   HCT 40.3 03/12/2013   PLT 238.0 03/12/2013   GLUCOSE 158* 03/12/2013   CHOL 174 02/19/2013   TRIG 81.0 02/19/2013   HDL 38.60* 02/19/2013  LDLDIRECT 158.1 02/07/2012   LDLCALC 119* 02/19/2013   ALT 47 02/19/2013   AST 26 02/19/2013   NA 136  03/12/2013   K 3.7 03/12/2013   CL 106 03/12/2013   CREATININE 1.0 03/12/2013   BUN 16 03/12/2013   CO2 26 03/12/2013   TSH 2.32 02/19/2013   HGBA1C 7.5* 03/12/2013       Assessment & Plan:

## 2014-07-09 NOTE — Patient Instructions (Signed)
Health Maintenance A healthy lifestyle and preventative care can promote health and wellness.  Maintain regular health, dental, and eye exams.  Eat a healthy diet. Foods like vegetables, fruits, whole grains, low-fat dairy products, and lean protein foods contain the nutrients you need and are low in calories. Decrease your intake of foods high in solid fats, added sugars, and salt. Get information about a proper diet from your health care provider, if necessary.  Regular physical exercise is one of the most important things you can do for your health. Most adults should get at least 150 minutes of moderate-intensity exercise (any activity that increases your heart rate and causes you to sweat) each week. In addition, most adults need muscle-strengthening exercises on 2 or more days a week.   Maintain a healthy weight. The body mass index (BMI) is a screening tool to identify possible weight problems. It provides an estimate of body fat based on height and weight. Your health care provider can find your BMI and can help you achieve or maintain a healthy weight. For males 20 years and older:  A BMI below 18.5 is considered underweight.  A BMI of 18.5 to 24.9 is normal.  A BMI of 25 to 29.9 is considered overweight.  A BMI of 30 and above is considered obese.  Maintain normal blood lipids and cholesterol by exercising and minimizing your intake of saturated fat. Eat a balanced diet with plenty of fruits and vegetables. Blood tests for lipids and cholesterol should begin at age 20 and be repeated every 5 years. If your lipid or cholesterol levels are high, you are over age 50, or you are at high risk for heart disease, you may need your cholesterol levels checked more frequently.Ongoing high lipid and cholesterol levels should be treated with medicines if diet and exercise are not working.  If you smoke, find out from your health care provider how to quit. If you do not use tobacco, do not  start.  Lung cancer screening is recommended for adults aged 55-80 years who are at high risk for developing lung cancer because of a history of smoking. A yearly low-dose CT scan of the lungs is recommended for people who have at least a 30-pack-year history of smoking and are current smokers or have quit within the past 15 years. A pack year of smoking is smoking an average of 1 pack of cigarettes a day for 1 year (for example, a 30-pack-year history of smoking could mean smoking 1 pack a day for 30 years or 2 packs a day for 15 years). Yearly screening should continue until the smoker has stopped smoking for at least 15 years. Yearly screening should be stopped for people who develop a health problem that would prevent them from having lung cancer treatment.  If you choose to drink alcohol, do not have more than 2 drinks per day. One drink is considered to be 12 oz (360 mL) of beer, 5 oz (150 mL) of wine, or 1.5 oz (45 mL) of liquor.  Avoid the use of street drugs. Do not share needles with anyone. Ask for help if you need support or instructions about stopping the use of drugs.  High blood pressure causes heart disease and increases the risk of stroke. Blood pressure should be checked at least every 1-2 years. Ongoing high blood pressure should be treated with medicines if weight loss and exercise are not effective.  If you are 45-79 years old, ask your health care provider if   you should take aspirin to prevent heart disease.  Diabetes screening involves taking a blood sample to check your fasting blood sugar level. This should be done once every 3 years after age 45 if you are at a normal weight and without risk factors for diabetes. Testing should be considered at a younger age or be carried out more frequently if you are overweight and have at least 1 risk factor for diabetes.  Colorectal cancer can be detected and often prevented. Most routine colorectal cancer screening begins at the age of 50  and continues through age 75. However, your health care provider may recommend screening at an earlier age if you have risk factors for colon cancer. On a yearly basis, your health care provider may provide home test kits to check for hidden blood in the stool. A small camera at the end of a tube may be used to directly examine the colon (sigmoidoscopy or colonoscopy) to detect the earliest forms of colorectal cancer. Talk to your health care provider about this at age 50 when routine screening begins. A direct exam of the colon should be repeated every 5-10 years through age 75, unless early forms of precancerous polyps or small growths are found.  People who are at an increased risk for hepatitis B should be screened for this virus. You are considered at high risk for hepatitis B if:  You were born in a country where hepatitis B occurs often. Talk with your health care provider about which countries are considered high risk.  Your parents were born in a high-risk country and you have not received a shot to protect against hepatitis B (hepatitis B vaccine).  You have HIV or AIDS.  You use needles to inject street drugs.  You live with, or have sex with, someone who has hepatitis B.  You are a man who has sex with other men (MSM).  You get hemodialysis treatment.  You take certain medicines for conditions like cancer, organ transplantation, and autoimmune conditions.  Hepatitis C blood testing is recommended for all people born from 1945 through 1965 and any individual with known risk factors for hepatitis C.  Healthy men should no longer receive prostate-specific antigen (PSA) blood tests as part of routine cancer screening. Talk to your health care provider about prostate cancer screening.  Testicular cancer screening is not recommended for adolescents or adult males who have no symptoms. Screening includes self-exam, a health care provider exam, and other screening tests. Consult with your  health care provider about any symptoms you have or any concerns you have about testicular cancer.  Practice safe sex. Use condoms and avoid high-risk sexual practices to reduce the spread of sexually transmitted infections (STIs).  You should be screened for STIs, including gonorrhea and chlamydia if:  You are sexually active and are younger than 24 years.  You are older than 24 years, and your health care provider tells you that you are at risk for this type of infection.  Your sexual activity has changed since you were last screened, and you are at an increased risk for chlamydia or gonorrhea. Ask your health care provider if you are at risk.  If you are at risk of being infected with HIV, it is recommended that you take a prescription medicine daily to prevent HIV infection. This is called pre-exposure prophylaxis (PrEP). You are considered at risk if:  You are a man who has sex with other men (MSM).  You are a heterosexual man who   is sexually active with multiple partners.  You take drugs by injection.  You are sexually active with a partner who has HIV.  Talk with your health care provider about whether you are at high risk of being infected with HIV. If you choose to begin PrEP, you should first be tested for HIV. You should then be tested every 3 months for as long as you are taking PrEP.  Use sunscreen. Apply sunscreen liberally and repeatedly throughout the day. You should seek shade when your shadow is shorter than you. Protect yourself by wearing long sleeves, pants, a wide-brimmed hat, and sunglasses year round whenever you are outdoors.  Tell your health care provider of new moles or changes in moles, especially if there is a change in shape or color. Also, tell your health care provider if a mole is larger than the size of a pencil eraser.  A one-time screening for abdominal aortic aneurysm (AAA) and surgical repair of large AAAs by ultrasound is recommended for men aged  65-75 years who are current or former smokers.  Stay current with your vaccines (immunizations). Document Released: 12/15/2007 Document Revised: 06/23/2013 Document Reviewed: 11/13/2010 Exit[employer] Patient Information 2015 [employer]. This information is not intended to replace advice given to you by your health care provider. Make sure you discuss any questions you have with your health care provider. Type 2 Diabetes Mellitus Type 2 diabetes mellitus, often simply referred to as type 2 diabetes, is a long-lasting (chronic) disease. In type 2 diabetes, the pancreas does not make enough insulin (a hormone), the cells are less responsive to the insulin that is made (insulin resistance), or both. Normally, insulin moves sugars from food into the tissue cells. The tissue cells use the sugars for energy. The lack of insulin or the lack of normal response to insulin causes excess sugars to build up in the blood instead of going into the tissue cells. As a result, high blood sugar (hyperglycemia) develops. The effect of high sugar (glucose) levels can cause many complications. Type 2 diabetes was also previously called adult-onset diabetes, but it can occur at any age.  RISK FACTORS  A person is predisposed to developing type 2 diabetes if someone in the family has the disease and also has one or more of the following primary risk factors:  Overweight.  An inactive lifestyle.  A history of consistently eating high-calorie foods. Maintaining a normal weight and regular physical activity can reduce the chance of developing type 2 diabetes. SYMPTOMS  A person with type 2 diabetes may not show symptoms initially. The symptoms of type 2 diabetes appear slowly. The symptoms include:  Increased thirst (polydipsia).  Increased urination (polyuria).  Increased urination during the night (nocturia).  Weight loss. This weight loss may be rapid.  Frequent, recurring infections.  Tiredness  (fatigue).  Weakness.  Vision changes, such as blurred vision.  Fruity smell to your breath.  Abdominal pain.  Nausea or vomiting.  Cuts or bruises which are slow to heal.  Tingling or numbness in the hands or feet. DIAGNOSIS Type 2 diabetes is frequently not diagnosed until complications of diabetes are present. Type 2 diabetes is diagnosed when symptoms or complications are present and when blood glucose levels are increased. Your blood glucose level may be checked by one or more of the following blood tests:  A fasting blood glucose test. You will not be allowed to eat for at least 8 hours before a blood sample is taken.  A random blood   glucose test. Your blood glucose is checked at any time of the day regardless of when you ate.  A hemoglobin A1c blood glucose test. A hemoglobin A1c test provides information about blood glucose control over the previous 3 months.  An oral glucose tolerance test (OGTT). Your blood glucose is measured after you have not eaten (fasted) for 2 hours and then after you drink a glucose-containing beverage. TREATMENT   You may need to take insulin or diabetes medicine daily to keep blood glucose levels in the desired range.  If you use insulin, you may need to adjust the dosage depending on the carbohydrates that you eat with each meal or snack. The treatment goal is to maintain the before meal blood sugar (preprandial glucose) level at 70-130 mg/dL. HOME CARE INSTRUCTIONS   Have your hemoglobin A1c level checked twice a year.  Perform daily blood glucose monitoring as directed by your health care provider.  Monitor urine ketones when you are ill and as directed by your health care provider.  Take your diabetes medicine or insulin as directed by your health care provider to maintain your blood glucose levels in the desired range.  Never run out of diabetes medicine or insulin. It is needed every day.  If you are using insulin, you may need to  adjust the amount of insulin given based on your intake of carbohydrates. Carbohydrates can raise blood glucose levels but need to be included in your diet. Carbohydrates provide vitamins, minerals, and fiber which are an essential part of a healthy diet. Carbohydrates are found in fruits, vegetables, whole grains, dairy products, legumes, and foods containing added sugars.  Eat healthy foods. You should make an appointment to see a registered dietitian to help you create an eating plan that is right for you.  Lose weight if you are overweight.  Carry a medical alert card or wear your medical alert jewelry.  Carry a 15-gram carbohydrate snack with you at all times to treat low blood glucose (hypoglycemia). Some examples of 15-gram carbohydrate snacks include:  Glucose tablets, 3 or 4.  Glucose gel, 15-gram tube.  Raisins, 2 tablespoons (24 grams).  Jelly beans, 6.  Animal crackers, 8.  Regular pop, 4 ounces (120 mL).  Gummy treats, 9.  Recognize hypoglycemia. Hypoglycemia occurs with blood glucose levels of 70 mg/dL and below. The risk for hypoglycemia increases when fasting or skipping meals, during or after intense exercise, and during sleep. Hypoglycemia symptoms can include:  Tremors or shakes.  Decreased ability to concentrate.  Sweating.  Increased heart rate.  Headache.  Dry mouth.  Hunger.  Irritability.  Anxiety.  Restless sleep.  Altered speech or coordination.  Confusion.  Treat hypoglycemia promptly. If you are alert and able to safely swallow, follow the 15:15 rule:  Take 15-20 grams of rapid-acting glucose or carbohydrate. Rapid-acting options include glucose gel, glucose tablets, or 4 ounces (120 mL) of fruit juice, regular soda, or low-fat milk.  Check your blood glucose level 15 minutes after taking the glucose.  Take 15-20 grams more of glucose if the repeat blood glucose level is still 70 mg/dL or below.  Eat a meal or snack within 1 hour  once blood glucose levels return to normal.  Be alert to feeling very thirsty and urinating more frequently than usual, which are early signs of hyperglycemia. An early awareness of hyperglycemia allows for prompt treatment. Treat hyperglycemia as directed by your health care provider.  Engage in at least 150 minutes of moderate-intensity physical activity   a week, spread over at least 3 days of the week or as directed by your health care provider. In addition, you should engage in resistance exercise at least 2 times a week or as directed by your health care provider. Try to spend no more than 90 minutes at one time inactive.  Adjust your medicine and food intake as needed if you start a new exercise or sport.  Follow your sick-day plan anytime you are unable to eat or drink as usual.  Do not use any tobacco products including cigarettes, chewing tobacco, or electronic cigarettes. If you need help quitting, ask your health care provider.  Limit alcohol intake to no more than 1 drink per day for nonpregnant women and 2 drinks per day for men. You should drink alcohol only when you are also eating food. Talk with your health care provider whether alcohol is safe for you. Tell your health care provider if you drink alcohol several times a week.  Keep all follow-up visits as directed by your health care provider. This is important.  Schedule an eye exam soon after the diagnosis of type 2 diabetes and then annually.  Perform daily skin and foot care. Examine your skin and feet daily for cuts, bruises, redness, nail problems, bleeding, blisters, or sores. A foot exam by a health care provider should be done annually.  Brush your teeth and gums at least twice a day and floss at least once a day. Follow up with your dentist regularly.  Share your diabetes management plan with your workplace or school.  Stay up-to-date with immunizations. It is recommended that people with diabetes who are over 65  years old get the pneumonia vaccine. In some cases, two separate shots may be given. Ask your health care provider if your pneumonia vaccination is up-to-date.  Learn to manage stress.  Obtain ongoing diabetes education and support as needed.  Participate in or seek rehabilitation as needed to maintain or improve independence and quality of life. Request a physical or occupational therapy referral if you are having foot or hand numbness, or difficulties with grooming, dressing, eating, or physical activity. SEEK MEDICAL CARE IF:   You are unable to eat food or drink fluids for more than 6 hours.  You have nausea and vomiting for more than 6 hours.  Your blood glucose level is over 240 mg/dL.  There is a change in mental status.  You develop an additional serious illness.  You have diarrhea for more than 6 hours.  You have been sick or have had a fever for a couple of days and are not getting better.  You have pain during any physical activity.  SEEK IMMEDIATE MEDICAL CARE IF:  You have difficulty breathing.  You have moderate to large ketone levels. MAKE SURE YOU:  Understand these instructions.  Will watch your condition.  Will get help right away if you are not doing well or get worse. Document Released: 06/18/2005 Document Revised: 11/02/2013 Document Reviewed: 01/15/2012 Exit[employer] Patient Information 2015 [employer]. This information is not intended to replace advice given to you by your health care provider. Make sure you discuss any questions you have with your health care provider.  

## 2014-07-11 NOTE — Assessment & Plan Note (Signed)
He will work on his lifestyle modifications Will start invokana and bydureon Will start nutrition education

## 2014-07-11 NOTE — Assessment & Plan Note (Signed)
He will try silenor for the frequent awakenings

## 2014-07-11 NOTE — Assessment & Plan Note (Signed)
His blood sugars are not well controlled and he needs to lose weight Will start invokamet and bydureon He was referred for an eye exam and for diabetic education He agrees to work on his lifestyle modifications

## 2014-07-11 NOTE — Assessment & Plan Note (Signed)
Exam done Vaccines were reviewed and updated Labs ordered Pt ed material was given 

## 2014-07-12 ENCOUNTER — Encounter: Payer: Self-pay | Admitting: Internal Medicine

## 2014-07-12 ENCOUNTER — Ambulatory Visit (INDEPENDENT_AMBULATORY_CARE_PROVIDER_SITE_OTHER): Payer: BC Managed Care – PPO | Admitting: Internal Medicine

## 2014-07-12 VITALS — BP 132/84 | HR 67 | Temp 98.3°F | Resp 16 | Ht 77.0 in | Wt 361.5 lb

## 2014-07-12 DIAGNOSIS — E119 Type 2 diabetes mellitus without complications: Secondary | ICD-10-CM

## 2014-07-12 DIAGNOSIS — E669 Obesity, unspecified: Secondary | ICD-10-CM

## 2014-07-12 DIAGNOSIS — E1169 Type 2 diabetes mellitus with other specified complication: Secondary | ICD-10-CM

## 2014-07-12 DIAGNOSIS — E785 Hyperlipidemia, unspecified: Secondary | ICD-10-CM

## 2014-07-12 NOTE — Assessment & Plan Note (Signed)
Start bydureon - samples and demonstration given in the office today Start invokamet

## 2014-07-12 NOTE — Assessment & Plan Note (Signed)
He is not ready to start a statin yet and at his age I can't make a compelling case to start a statin

## 2014-07-12 NOTE — Progress Notes (Signed)
   Subjective:    Patient ID: Alesia MorinJustin Sulewski, male    DOB: 08-17-1980, 34 y.o.   MRN: [id number]  Diabetes      Review of Systems     Objective:   Physical Exam   Lab Results  Component Value Date   WBC 8.0 07/09/2014   HGB 14.0 07/09/2014   HCT 42.7 07/09/2014   PLT 275.0 07/09/2014   GLUCOSE 133* 07/09/2014   GLUCOSE 133* 07/09/2014   CHOL 225* 07/09/2014   TRIG 100.0 07/09/2014   HDL 27.30* 07/09/2014   LDLDIRECT 158.1 02/07/2012   LDLCALC 178* 07/09/2014   ALT 20 07/09/2014   AST 16 07/09/2014   NA 139 07/09/2014   NA 139 07/09/2014   K 4.2 07/09/2014   K 4.2 07/09/2014   CL 106 07/09/2014   CL 106 07/09/2014   CREATININE 0.9 07/09/2014   CREATININE 0.9 07/09/2014   BUN 14 07/09/2014   BUN 14 07/09/2014   CO2 23 07/09/2014   CO2 23 07/09/2014   TSH 2.04 07/09/2014   HGBA1C 8.4* 07/09/2014   MICROALBUR 1.1 07/09/2014       Assessment & Plan:

## 2014-07-12 NOTE — Progress Notes (Signed)
Pre visit review using our clinic review tool, if applicable. No additional management support is needed unless otherwise documented below in the visit note. 

## 2014-07-12 NOTE — Progress Notes (Signed)
Subjective:    Patient ID: Stephen Hall, male    DOB: 06-13-1981, 34 y.o.   MRN: [id number]  HPI Comments: He returns to start new meds for poorly controlled DM 2  Diabetes He presents for his follow-up diabetic visit. He has type 2 diabetes mellitus. His disease course has been worsening. There are no hypoglycemic associated symptoms. There are no diabetic associated symptoms. Pertinent negatives for diabetes include no blurred vision, no chest pain, no fatigue, no foot paresthesias, no foot ulcerations, no polydipsia, no polyphagia, no polyuria, no visual change, no weakness and no weight loss. Symptoms are worsening. There are no diabetic complications. When asked about current treatments, none were reported. He is following a generally unhealthy diet. Meal planning includes avoidance of concentrated sweets. He participates in exercise intermittently. An ACE inhibitor/angiotensin II receptor blocker is not being taken. Eye exam is current.      Review of Systems  Constitutional: Negative.  Negative for fever, chills, weight loss, diaphoresis, appetite change and fatigue.  HENT: Negative.   Eyes: Negative.  Negative for blurred vision.  Respiratory: Negative.  Negative for cough, choking, chest tightness, shortness of breath, wheezing and stridor.   Cardiovascular: Negative.  Negative for chest pain, palpitations and leg swelling.  Gastrointestinal: Negative.  Negative for nausea, vomiting, abdominal pain, diarrhea, constipation and blood in stool.  Endocrine: Negative.  Negative for polydipsia, polyphagia and polyuria.  Genitourinary: Negative.   Musculoskeletal: Negative.   Skin: Negative.   Allergic/Immunologic: Negative.   Neurological: Negative.  Negative for weakness.  Hematological: Negative.  Negative for adenopathy. Does not bruise/bleed easily.  Psychiatric/Behavioral: Negative.        Objective:   Physical Exam  Constitutional: He is oriented to person, place, and  time. He appears well-developed and well-nourished. No distress.  HENT:  Head: Normocephalic and atraumatic.  Mouth/Throat: Oropharynx is clear and moist. No oropharyngeal exudate.  Eyes: Conjunctivae are normal. Right eye exhibits no discharge. Left eye exhibits no discharge. No scleral icterus.  Neck: Normal range of motion. Neck supple. No JVD present. No tracheal deviation present. No thyromegaly present.  Cardiovascular: Normal rate, regular rhythm, normal heart sounds and intact distal pulses.  Exam reveals no gallop and no friction rub.   No murmur heard. Pulmonary/Chest: Effort normal and breath sounds normal. No stridor. No respiratory distress. He has no wheezes. He has no rales. He exhibits no tenderness.  Abdominal: Soft. Bowel sounds are normal. He exhibits no distension and no mass. There is no tenderness. There is no rebound and no guarding.  Musculoskeletal: Normal range of motion. He exhibits no edema or tenderness.  Lymphadenopathy:    He has no cervical adenopathy.  Neurological: He is oriented to person, place, and time.  Skin: Skin is warm and dry. No rash noted. He is not diaphoretic. No erythema. No pallor.  Vitals reviewed.   Lab Results  Component Value Date   WBC 8.0 07/09/2014   HGB 14.0 07/09/2014   HCT 42.7 07/09/2014   PLT 275.0 07/09/2014   GLUCOSE 133* 07/09/2014   GLUCOSE 133* 07/09/2014   CHOL 225* 07/09/2014   TRIG 100.0 07/09/2014   HDL 27.30* 07/09/2014   LDLDIRECT 158.1 02/07/2012   LDLCALC 178* 07/09/2014   ALT 20 07/09/2014   AST 16 07/09/2014   NA 139 07/09/2014   NA 139 07/09/2014   K 4.2 07/09/2014   K 4.2 07/09/2014   CL 106 07/09/2014   CL 106 07/09/2014   CREATININE 0.9 07/09/2014  CREATININE 0.9 07/09/2014   BUN 14 07/09/2014   BUN 14 07/09/2014   CO2 23 07/09/2014   CO2 23 07/09/2014   TSH 2.04 07/09/2014   HGBA1C 8.4* 07/09/2014   MICROALBUR 1.1 07/09/2014        Assessment & Plan:

## 2014-07-15 ENCOUNTER — Other Ambulatory Visit: Payer: Self-pay | Admitting: Internal Medicine

## 2014-07-15 DIAGNOSIS — E669 Obesity, unspecified: Principal | ICD-10-CM

## 2014-07-15 DIAGNOSIS — E1169 Type 2 diabetes mellitus with other specified complication: Secondary | ICD-10-CM

## 2014-07-15 MED ORDER — EXENATIDE ER 2 MG ~~LOC~~ PEN: 1.0000 | PEN_INJECTOR | SUBCUTANEOUS | Status: DC

## 2014-07-15 NOTE — Addendum Note (Signed)
Addended by: Etta GrandchildJONES, Nari Vannatter L on: 07/15/2014 01:07 PM   Modules accepted: [employer]Set

## 2014-08-03 NOTE — Telephone Encounter (Signed)
Pt wife called in about the educator calling them about info on his Diabetes

## 2014-08-10 ENCOUNTER — Encounter: Payer: BC Managed Care – PPO | Attending: Internal Medicine | Admitting: *Deleted

## 2014-08-10 ENCOUNTER — Encounter: Payer: Self-pay | Admitting: *Deleted

## 2014-08-10 DIAGNOSIS — E119 Type 2 diabetes mellitus without complications: Secondary | ICD-10-CM | POA: Diagnosis present

## 2014-08-10 DIAGNOSIS — Z6841 Body Mass Index (BMI) 40.0 and over, adult: Secondary | ICD-10-CM | POA: Insufficient documentation

## 2014-08-10 DIAGNOSIS — Z713 Dietary counseling and surveillance: Secondary | ICD-10-CM | POA: Insufficient documentation

## 2014-08-10 DIAGNOSIS — E669 Obesity, unspecified: Secondary | ICD-10-CM | POA: Diagnosis not present

## 2014-08-10 DIAGNOSIS — E1169 Type 2 diabetes mellitus with other specified complication: Secondary | ICD-10-CM

## 2014-08-10 NOTE — Progress Notes (Signed)
Diabetes Self-Management Education  Visit Type:  Initial  Appt. Start Time: 1630 Appt. End Time: 1800  08/10/2014  Mr. Stephen MorinJustin Hall, identified by name and date of birth, is a 34 y.o. male with a diagnosis of Diabetes: Type 2.  Other people present during visit:  Spouse/SO   ASSESSMENT  Jill AlexandersJustin was recently diagnosed with DMT2 last month.  His wife has made a lot of changes to his diet.  Initial Visit Information:  Are you currently following a meal plan?: No   Are you taking your medications as prescribed?: Yes Are you checking your feet?: No   How often do you need to have someone help you when you read instructions, pamphlets, or other written materials from your doctor or pharmacy?: 1 - Never    Psychosocial:     Patient Belief/Attitude about Diabetes: Motivated to manage diabetes Self-care barriers: None Self-management support: Family Other persons present: Spouse/SO Patient Concerns: Nutrition/Meal planning Special Needs: None Preferred Learning Style: No preference indicated Learning Readiness: Ready  Complications:   Last HgB A1C per patient/outside source: 7.5 mg/dL (8/4/131/8/16) How often do you check your blood sugar?: 0 times/day (not testing) Number of hypoglycemic episodes per month: 0 Number of hyperglycemic episodes per week:  (daily) Have you had a dilated eye exam in the past 12 months?: Yes Have you had a dental exam in the past 12 months?: Yes  Diet Intake:  Breakfast: egg or peanut butter; sometimes cheerios Snack (morning): fruit Lunch: salads or sandwich (chicken) or leftovers from dinner Snack (afternoon): peanut butter and crackers Dinner: lots of vegetables, lean protein.  not many sweets  Snack (evening): not much any more- may have cookie sometimes Beverage(s): lots of water (40 oz), soda rarely  Exercise:  Exercise: ADL's  Individualized Plan for Diabetes Self-Management Training:   Learning Objective:  Patient will have a greater  understanding of diabetes self-management.  Patient education plan per assessed needs and concerns is to attend individual sessions    Education Topics Reviewed with Patient Today:  Definition of diabetes, type 1 and 2, and the diagnosis of diabetes, Factors that contribute to the development of diabetes, Explored patient's options for treatment of their diabetes Role of diet in the treatment of diabetes and the relationship between the three main macronutrients and blood glucose level, Food label reading, portion sizes and measuring food., Carbohydrate counting Role of exercise on diabetes management, blood pressure control and cardiac health. Reviewed medication adjustment guidelines for hyperglycemia and sick days., Reviewed patients medication for diabetes, action, purpose, timing of dose and side effects. Purpose and frequency of SMBG.   Relationship between chronic complications and blood glucose control        PATIENTS GOALS/Plan (Developed by the patient):  Nutrition: Follow meal plan discussed Physical Activity: Exercise 3-5 times per week, 30 minutes per day Medications: take my medication as prescribed Monitoring : Other (comment) (consider testing)  Plan:   Patient Instructions  Goals:  Follow Diabetes Meal Plan as instructed  Eat 3 meals and 2 snacks, every 3-5 hrs  Limit carbohydrate intake to 45-60 grams carbohydrate/meal  Limit carbohydrate intake to 15-30 grams carbohydrate/snack  Add lean protein foods to meals/snacks  Monitor glucose levels as instructed by your doctor  Aim for 30 mins of physical activity daily  Bring food record and glucose log to your next nutrition visit     Expected Outcomes:  Demonstrated interest in learning. Expect positive outcomes  Education material provided: Living Well with Diabetes, Meal plan card,  Snack sheet and No sodium seasonings  If problems or questions, patient to contact team via:  Phone and/or  Email  Future DSME appointment: PRN

## 2014-08-10 NOTE — Patient Instructions (Signed)
Goals:  Follow Diabetes Meal Plan as instructed  Eat 3 meals and 2 snacks, every 3-5 hrs  Limit carbohydrate intake to 45-60 grams carbohydrate/meal  Limit carbohydrate intake to 15-30 grams carbohydrate/snack  Add lean protein foods to meals/snacks  Monitor glucose levels as instructed by your doctor  Aim for 30 mins of physical activity daily  Bring food record and glucose log to your next nutrition visit 

## 2014-08-21 ENCOUNTER — Encounter: Payer: Self-pay | Admitting: Family

## 2014-08-21 ENCOUNTER — Ambulatory Visit (INDEPENDENT_AMBULATORY_CARE_PROVIDER_SITE_OTHER): Payer: BC Managed Care – PPO | Admitting: Family

## 2014-08-21 ENCOUNTER — Telehealth: Payer: Self-pay | Admitting: Family

## 2014-08-21 VITALS — BP 130/90 | HR 72 | Temp 98.1°F | Ht 77.0 in | Wt 342.2 lb

## 2014-08-21 DIAGNOSIS — M545 Low back pain, unspecified: Secondary | ICD-10-CM | POA: Insufficient documentation

## 2014-08-21 MED ORDER — METHYLPREDNISOLONE 4 MG PO KIT: PACK | ORAL | Status: DC

## 2014-08-21 MED ORDER — CYCLOBENZAPRINE HCL 10 MG PO TABS: 10.0000 mg | ORAL_TABLET | Freq: Three times a day (TID) | ORAL | Status: DC | PRN

## 2014-08-21 NOTE — Patient Instructions (Signed)
Start medrol dose pak and flexeril as needed.  (flexeril may cause drowsiness so be careful when you take it) Go to ER if severe/worsening low back pain, trouble walking, bowel/bladder incontinence. Follow up with Dr. Yetta BarreJones if if symptom are not improved in 1 week.

## 2014-08-21 NOTE — Progress Notes (Signed)
Pre visit review using our clinic review tool, if applicable. No additional management support is needed unless otherwise documented below in the visit note. 

## 2014-08-21 NOTE — Progress Notes (Signed)
Subjective:    Patient ID: Stephen Hall, male    DOB: May 18, 1981, 34 y.o.   MRN: [id number]  HPI  Stephen Hall is a 34 yr old male who presents today with chief complaint of low back pain. Reports previous hx of low back pain. Started to hurt Wednesday of this week, then worsened on Thursday.  Pain is non-radiating. Pain is worst with standing.  He has tried ibuprofen, no significant improvement. Has used prednisone >1 year ago for low back pain which helped him.  Denies bowel/bladder incontinence.  Does feel as though his right leg is stronger than his left.  He denies numbness in his legs.    Review of Systems    see HPI  Past Medical History  Diagnosis Date  . Asthma   . Environmental allergies   . Diabetes mellitus without complication     History   Social History  . Marital Status: Married    Spouse Name: N/A  . Number of Children: N/A  . Years of Education: N/A   Occupational History  . Not on file.   Social History Main Topics  . Smoking status: Never Smoker   . Smokeless tobacco: Never Used  . Alcohol Use: No  . Drug Use: No  . Sexual Activity: Yes   Other Topics Concern  . Not on file   Social History Narrative   Caffienated drinks-yes   Seat belt use often-yes   Regular Exercise-no   Smoke alarm in the home-yes   Firearms/guns in the home-no   History of physical abuse-no                No past surgical history on file.  Family History  Problem Relation Age of Onset  . Alcohol abuse Other   . Arthritis Other   . Hypertension Other   . Diabetes Other   . Obesity Mother   . Hypertension Mother   . Obesity Father   . Hypertension Father   . Cancer Neg Hx   . Heart disease Neg Hx   . Stroke Neg Hx   . Kidney disease Neg Hx     No Known Allergies  Current Outpatient Prescriptions on File Prior to Visit  Medication Sig Dispense Refill  . Canagliflozin-Metformin HCl (INVOKAMET) 50-1000 MG TABS Take 1 tablet by mouth 2 (two) times  daily. 60 tablet 11  . Doxepin HCl 3 MG TABS Take 1 tablet (3 mg total) by mouth at bedtime as needed. 30 tablet 11  . Exenatide ER (BYDUREON) 2 MG PEN Inject 1 Act into the skin once a week. 4 each 11   No current facility-administered medications on file prior to visit.    BP 130/90 mmHg  Pulse 72  Temp(Src) 98.1 F (36.7 C) (Oral)  Ht  (1.956 m)  Wt 342 lb 4 oz (155.244 kg)  BMI 40.58 kg/m2  SpO2 97%    Objective:   Physical Exam  Constitutional: He is oriented to person, place, and time. He appears well-developed and well-nourished. No distress.  HENT:  Head: Normocephalic and atraumatic.  Cardiovascular: Normal rate and regular rhythm.   No murmur heard. Pulmonary/Chest: Effort normal and breath sounds normal. No respiratory distress. He has no wheezes. He has no rales.  Musculoskeletal: He exhibits no edema.  Left lower back tender to palpation  Neurological: He is alert and oriented to person, place, and time.  Reflex Scores:      Patellar reflexes are 2+ on the right  side and 2+ on the left side. Bilateral LE strength is 5/5  Skin: Skin is warm and dry.  Psychiatric: He has a normal mood and affect. His behavior is normal. Thought content normal.          Assessment & Plan:

## 2014-08-21 NOTE — Assessment & Plan Note (Signed)
Will rx with medrol dose pak, flexeril prn, advised follow up as outlined in AVS,

## 2014-08-23 NOTE — Telephone Encounter (Signed)
Left detailed message on cell# per DPR on file.

## 2014-08-25 ENCOUNTER — Telehealth: Payer: Self-pay | Admitting: *Deleted

## 2014-08-25 NOTE — Telephone Encounter (Signed)
[facility] - Client TELEPHONE ADVICE RECORD [facility] Patient Name: Stephen MorinJUSTIN Hall Gender: Male DOB: 1980/11/09 Age: 34 Y 4 M 4 D Return Phone Number: [phone] (Primary) Address: [place] [id number] Client [facility] - Client Client Site  [facility] Physician Sanda LingerJones, Thomas Contact Type Call Call Type Triage / Clinical Relationship To Patient Self Return Phone Number [phone] (Primary) Chief Complaint Back Pain - General Initial Comment Caller states he is having pain in his back, inflammation. He wants to have some medicine called in. PreDisposition Call Doctor Nurse Assessment Nurse: Lane HackerHarley, RN, Elvin SoWindy Date/Time Lamount Cohen(Eastern Time): 08/21/2014 9:42:45 AM Confirm and document reason for call. If symptomatic, describe symptoms. ---Caller states he is having pain in his lower back, every movement is causing sharp severe pain. Left leg is weak. Unable to climb stairs, can't lead with left leg. States that he had prednisone in the past for inflammation. He wants to have some medicine called in like this and/or muscle relaxer. Has the patient traveled out of the country within the last 30 days? ---Not Applicable Does the patient require triage? ---Yes Related visit to physician within the last 2 weeks? ---No Does the PT have any chronic conditions? (i.e. diabetes, asthma, etc.) ---Yes List chronic conditions. ---NIDDM, back pain off/on prior Guidelines Guideline Title Affirmed Question Affirmed Notes Nurse Date/Time Lamount Cohen(Eastern Time) Back Pain Weakness of a leg or foot (e.g., unable to bear weight, dragging foot) Lane HackerHarley, RN, Windy 08/21/2014 9:44:19 AM Disp. Time Lamount Cohen(Eastern Time) Disposition Final User 08/21/2014 9:19:30 AM Attempt made - message left Judene CompanionHarley, RN, Elvin SoWindy 08/21/2014 9:45:57 AM Go to ED Now (or PCP triage) Yes Lane HackerHarley, RN, Coralyn MarkWindy Caller Understands: Yes PLEASE NOTE:  All timestamps contained within this report are represented as [place] Standard Time. CONFIDENTIALTY NOTICE: This fax transmission is intended only for the addressee. It contains information that is legally privileged, confidential or otherwise protected from use or disclosure. If you are not the intended recipient, you are strictly prohibited from reviewing, disclosing, copying using or disseminating any of this information or taking any action in reliance on or regarding this information. If you have received this fax in error, please notify us immediately by telephone so that we can arrange for its return to us. Phone: [phone], Toll-Free: [phone], Fax: [phone] Page: 2 of 2 Call Id: [id number] Disagree/Comply: Comply Care Advice Given Per Guideline GO TO ED NOW (OR PCP TRIAGE): * IF NO PCP TRIAGE: You need to be seen. [facility] within the next hour. Leave as soon as you can. DRIVING: Another adult should drive. CARE ADVICE given per Back Pain (Adult) guideline. After Care Instructions Given Call Event Type User Date / Time Description Comments User: Rubie MaidWindy, Harley, RN Date/Time Lamount Cohen(Eastern Time): 08/21/2014 9:48:16 AM RN advised that medications are not called in w/o being seen after hours/weekends. Caller verb. understanding and will call to be seen today. Referrals [facility]

## 2014-11-11 ENCOUNTER — Telehealth: Payer: Self-pay | Admitting: Internal Medicine

## 2014-11-11 LAB — HM DIABETES EYE EXAM

## 2014-11-11 NOTE — Telephone Encounter (Signed)
Would like to know if [facility]  Should have been sent within the last 30 minutes.  Patient would like an eye kept open for it.

## 2014-11-12 ENCOUNTER — Ambulatory Visit: Payer: BC Managed Care – PPO | Admitting: Internal Medicine

## 2014-11-12 NOTE — Telephone Encounter (Signed)
Called patient to advise that no eye exam report has been received as of yet. I called the facility to have them fax the report over to our office.

## 2014-12-29 ENCOUNTER — Ambulatory Visit: Payer: BC Managed Care – PPO | Admitting: Internal Medicine

## 2015-05-03 ENCOUNTER — Encounter: Payer: BC Managed Care – PPO | Admitting: Internal Medicine

## 2015-05-03 DIAGNOSIS — Z0289 Encounter for other administrative examinations: Secondary | ICD-10-CM

## 2015-11-17 LAB — HM DIABETES EYE EXAM

## 2016-02-29 ENCOUNTER — Encounter: Payer: Self-pay | Admitting: Internal Medicine

## 2016-02-29 ENCOUNTER — Other Ambulatory Visit (INDEPENDENT_AMBULATORY_CARE_PROVIDER_SITE_OTHER): Payer: BC Managed Care – PPO

## 2016-02-29 ENCOUNTER — Ambulatory Visit (INDEPENDENT_AMBULATORY_CARE_PROVIDER_SITE_OTHER): Payer: BC Managed Care – PPO | Admitting: Internal Medicine

## 2016-02-29 VITALS — BP 142/90 | HR 75 | Temp 98.3°F | Resp 16 | Ht 77.0 in | Wt 360.0 lb

## 2016-02-29 DIAGNOSIS — E119 Type 2 diabetes mellitus without complications: Secondary | ICD-10-CM

## 2016-02-29 DIAGNOSIS — E1169 Type 2 diabetes mellitus with other specified complication: Secondary | ICD-10-CM

## 2016-02-29 DIAGNOSIS — J209 Acute bronchitis, unspecified: Secondary | ICD-10-CM | POA: Diagnosis not present

## 2016-02-29 DIAGNOSIS — E669 Obesity, unspecified: Secondary | ICD-10-CM

## 2016-02-29 LAB — COMPREHENSIVE METABOLIC PANEL
ALT: 38 U/L (ref 0–53)
AST: 29 U/L (ref 0–37)
Albumin: 4.2 g/dL (ref 3.5–5.2)
Alkaline Phosphatase: 101 U/L (ref 39–117)
BUN: 8 mg/dL (ref 6–23)
CHLORIDE: 101 meq/L (ref 96–112)
CO2: 26 meq/L (ref 19–32)
Calcium: 8.9 mg/dL (ref 8.4–10.5)
Creatinine, Ser: 0.93 mg/dL (ref 0.40–1.50)
GFR: 119.71 mL/min (ref >60.00)
GLUCOSE: 303 mg/dL — AB (ref 70–99)
POTASSIUM: 4 meq/L (ref 3.5–5.1)
SODIUM: 135 meq/L (ref 135–145)
Total Bilirubin: 0.4 mg/dL (ref 0.2–1.2)
Total Protein: 7.4 g/dL (ref 6.0–8.3)

## 2016-02-29 LAB — LIPID PANEL
Cholesterol: 227 mg/dL — ABNORMAL HIGH (ref 0–200)
HDL: 32.9 mg/dL — ABNORMAL LOW (ref >39.00)
LDL CALC: 167 mg/dL — AB (ref 0–99)
NONHDL: 194.37
Total CHOL/HDL Ratio: 7
Triglycerides: 139 mg/dL (ref 0.0–149.0)
VLDL: 27.8 mg/dL (ref 0.0–40.0)

## 2016-02-29 LAB — HEMOGLOBIN A1C: Hgb A1c MFr Bld: 11.6 % — ABNORMAL HIGH (ref 4.6–6.5)

## 2016-02-29 MED ORDER — DOXYCYCLINE HYCLATE 100 MG PO TABS: 100.0000 mg | ORAL_TABLET | Freq: Two times a day (BID) | ORAL | 0 refills | Status: DC

## 2016-02-29 MED FILL — DOXYCYCLINE HYCLATE 100 MG: 100 | 7 days supply | Qty: 14 | Fill #0

## 2016-02-29 NOTE — Progress Notes (Signed)
   Subjective:    Patient ID: Stephen MorinJustin Renegar, male    DOB: Nov 06, 1980, 35 y.o.   MRN: [id number]  HPI The patient is a 35 YO man coming in for cough and chest congestion for about 3 weeks. Overall is worsening during that time. He had some chills in the first week. With productive cough of green and yellow sputum. No SOB and does not feel like asthma flare. Has not impacted his life and he is still working. He has past asthma although no current inhaler at home. He has tried several OTC products which did not provide relief (mucinex). Denies significant nasal drainage, some ear pressure, no sinus pressure. No fevers or chills. Is not keeping him from sleeping.  Additionally he is out of his bydureon [employer](insurance will not cover anymore) and is almost out of his invokamet [employer](insurance will not cover soon either) and needs to see his PCP as it has been some time.   Review of Systems  Constitutional: Negative for activity change, appetite change, chills, fatigue and fever.  HENT: Positive for ear pain, postnasal drip and sore throat. Negative for congestion, ear discharge, facial swelling, rhinorrhea, sinus pressure and trouble swallowing.   Eyes: Negative.   Respiratory: Positive for cough. Negative for chest tightness, shortness of breath and wheezing.   Cardiovascular: Negative for chest pain, palpitations and leg swelling.  Gastrointestinal: Negative.   Musculoskeletal: Negative.       Objective:   Physical Exam  Constitutional: He appears well-developed and well-nourished.  Overweight  HENT:  Head: Normocephalic and atraumatic.  Right Ear: External ear normal.  Left Ear: External ear normal.  Oropharynx with minimal erythema, no nasal crusting or sinus pressure.   Eyes: Pupils are equal, round, and reactive to light.  Neck: Normal range of motion.  Mild shotty LAD  Cardiovascular: Normal rate and regular rhythm.   Pulmonary/Chest: Effort normal. No respiratory distress. He has no  wheezes. He has no rales.  Some scattered rhonchi which partially clear with coughing.   Abdominal: Soft.  Skin: Skin is warm and dry.   Vitals:   02/29/16 0845  BP: (!) 158/88  Pulse: 75  Resp: 16  Temp: 98.3 F (36.8 C)  TempSrc: Oral  SpO2: 98%  Weight: (!) 360 lb (163.3 kg)  Height: 6\' 5"  (1.956 m)      Assessment & Plan:

## 2016-02-29 NOTE — Assessment & Plan Note (Signed)
Rx for doxycycline for ongoing symptoms of 3 weeks duration and hx asthma. Declines need for inhaler and no wheezing or SOB on exam.

## 2016-02-29 NOTE — Assessment & Plan Note (Signed)
Checking labs and will forward to PCP so that he can address at follow up visit which he is strongly encouraged to make in the next 1-2 weeks. He is only taking invokamet right now and is about to be out of that in 1-2 weeks. Not taking bydureon due to lack of coverage and cost.

## 2016-02-29 NOTE — Patient Instructions (Signed)
We will check the labs today and send them to Dr. Yetta BarreJones. Make sure to get in with him in the next couple of weeks to get the medicines cleared up.   We have sent in an antibiotic for you called doxycyline. Take 1 pill twice a day for 1 week.   Start taking some zyrtec over the counter to help with the drainage that is bothering the lungs.

## 2016-02-29 NOTE — Progress Notes (Signed)
Pre visit review using our clinic review tool, if applicable. No additional management support is needed unless otherwise documented below in the visit note. 

## 2016-03-28 ENCOUNTER — Ambulatory Visit: Payer: BC Managed Care – PPO | Admitting: Internal Medicine

## 2016-04-25 ENCOUNTER — Encounter: Payer: BC Managed Care – PPO | Admitting: Internal Medicine

## 2016-06-04 ENCOUNTER — Other Ambulatory Visit: Payer: Self-pay | Admitting: Internal Medicine

## 2016-06-04 DIAGNOSIS — E1169 Type 2 diabetes mellitus with other specified complication: Secondary | ICD-10-CM

## 2016-06-04 DIAGNOSIS — E669 Obesity, unspecified: Principal | ICD-10-CM

## 2016-06-20 ENCOUNTER — Ambulatory Visit (INDEPENDENT_AMBULATORY_CARE_PROVIDER_SITE_OTHER): Payer: BC Managed Care – PPO | Admitting: Internal Medicine

## 2016-06-20 ENCOUNTER — Ambulatory Visit (INDEPENDENT_AMBULATORY_CARE_PROVIDER_SITE_OTHER)
Admission: RE | Admit: 2016-06-20 | Discharge: 2016-06-20 | Disposition: A | Discharge status: Home / Self Care | Payer: BC Managed Care – PPO | Source: Ambulatory Visit | Attending: Internal Medicine | Admitting: Internal Medicine

## 2016-06-20 ENCOUNTER — Other Ambulatory Visit: Payer: BC Managed Care – PPO

## 2016-06-20 ENCOUNTER — Encounter: Payer: Self-pay | Admitting: Internal Medicine

## 2016-06-20 VITALS — BP 148/96 | HR 100 | Temp 97.9°F | Resp 16 | Ht 77.0 in | Wt 339.0 lb

## 2016-06-20 DIAGNOSIS — R05 Cough: Secondary | ICD-10-CM

## 2016-06-20 DIAGNOSIS — L0231 Cutaneous abscess of buttock: Secondary | ICD-10-CM | POA: Diagnosis not present

## 2016-06-20 DIAGNOSIS — R059 Cough, unspecified: Secondary | ICD-10-CM

## 2016-06-20 DIAGNOSIS — E669 Obesity, unspecified: Secondary | ICD-10-CM | POA: Diagnosis not present

## 2016-06-20 DIAGNOSIS — E1169 Type 2 diabetes mellitus with other specified complication: Secondary | ICD-10-CM | POA: Diagnosis not present

## 2016-06-20 DIAGNOSIS — J988 Other specified respiratory disorders: Secondary | ICD-10-CM

## 2016-06-20 LAB — POCT GLUCOSE (DEVICE FOR HOME USE): Glucose Fasting, POC: 168 mg/dL — AB (ref 70–99)

## 2016-06-20 LAB — POCT GLYCOSYLATED HEMOGLOBIN (HGB A1C): HEMOGLOBIN A1C: 12.9

## 2016-06-20 MED ORDER — INSULIN DEGLUDEC 200 UNIT/ML ~~LOC~~ SOPN: 50.0000 [IU] | PEN_INJECTOR | Freq: Every day | SUBCUTANEOUS | 11 refills | Status: DC

## 2016-06-20 MED ORDER — SULFAMETHOXAZOLE-TRIMETHOPRIM 800-160 MG PO TABS: 1.0000 | ORAL_TABLET | Freq: Two times a day (BID) | ORAL | 0 refills | Status: AC

## 2016-06-20 MED ORDER — RIFAMPIN 300 MG PO CAPS: 300.0000 mg | ORAL_CAPSULE | Freq: Two times a day (BID) | ORAL | 0 refills | Status: AC

## 2016-06-20 MED ORDER — ONETOUCH VERIO FLEX SYSTEM W/DEVICE KIT: 1.0000 | PACK | Freq: Three times a day (TID) | 0 refills | Status: DC

## 2016-06-20 MED ORDER — GLUCOSE BLOOD VI STRP: ORAL_STRIP | 12 refills | Status: DC

## 2016-06-20 MED ORDER — DAPAGLIFLOZIN PRO-METFORMIN ER 5-1000 MG PO TB24: 2.0000 | ORAL_TABLET | Freq: Every day | ORAL | 1 refills | Status: DC

## 2016-06-20 MED FILL — rifAMPin 300 MG CAPS: 300 | 10 days supply | Qty: 20 | Fill #0

## 2016-06-20 MED FILL — SULFAMETHOXAZOLE/TMP DS TAB: 800-160 | 10 days supply | Qty: 20 | Fill #0

## 2016-06-20 MED FILL — TRESIBA FLEXTOUCH 200 UNITS: 200 | 30 days supply | Qty: 9 | Fill #0

## 2016-06-20 MED FILL — ONETOUCH VERIO TEST STRIP: 30 days supply | Qty: 100 | Fill #0

## 2016-06-20 MED FILL — XIGDUO XR 5 MG-1,000 MG TAB: 5-1000 | 90 days supply | Qty: 180 | Fill #0

## 2016-06-20 NOTE — Patient Instructions (Signed)
Incision and Drainage Incision and drainage is a surgical procedure to open and drain a fluid-filled sac. The sac may be filled with pus, mucus, or blood. Examples of fluid-filled sacs that may need surgical drainage include cysts, skin infections (abscesses), and red lumps that develop from a ruptured cyst or a small abscess (boils). You may need this procedure if the affected area is large, painful, infected, or not healing well. Tell a health care provider about:  Any allergies you have.  All medicines you are taking, including vitamins, herbs, eye drops, creams, and over-the-counter medicines.  Any problems you or family members have had with anesthetic medicines.  Any blood disorders you have.  Any surgeries you have had.  Any medical conditions you have.  Whether you are pregnant or may be pregnant. What are the risks? Generally, this is a safe procedure. However, problems may occur, including:  Infection.  Bleeding.  Allergic reactions to medicines.  Scarring.  What happens before the procedure?  You may need an ultrasound or other imaging tests to see how large or deep the fluid-filled sac is.  You may have blood tests to check for infection.  You may get a tetanus shot.  You may be given antibiotic medicine to help prevent infection.  Follow instructions from your health care provider about eating or drinking restrictions.  Ask your health care provider about: ? Changing or stopping your regular medicines. This is especially important if you are taking diabetes medicines or blood thinners. ? Taking medicines such as aspirin and ibuprofen. These medicines can thin your blood. Do not take these medicines before your procedure if your health care provider instructs you not to.  Plan to have someone take you home after the procedure.  If you will be going home right after the procedure, plan to have someone stay with you for 24 hours. What happens during the  procedure?  To reduce your risk of infection: ? Your health care team will wash or sanitize their hands. ? Your skin will be washed with soap.  You will be given one or more of the following: ? A medicine to help you relax (sedative). ? A medicine to numb the area (local anesthetic). ? A medicine to make you fall asleep (general anesthetic).  An incision will be made in the top of the fluid-filled sac.  The contents of the sac may be squeezed out, or a syringe or tube (catheter)may be used to empty the sac.  The catheter may be left in place for several weeks to drain any fluid. Or, your health care provider may stitch open the edges of the incision to make a long-term opening for drainage (marsupialization).  The inside of the sac may be washed out (irrigated) with a sterile solution and packed with gauze before it is covered with a bandage (dressing). The procedure may vary among health care providers and hospitals. What happens after the procedure?  Your blood pressure, heart rate, breathing rate, and blood oxygen level will be monitored often until the medicines you were given have worn off.  Do not drive for 24 hours if you received a sedative. This information is not intended to replace advice given to you by your health care provider. Make sure you discuss any questions you have with your health care provider. Document Released: 12/12/2000 Document Revised: 11/24/2015 Document Reviewed: 04/08/2015 Elsevier Interactive Patient Education  2017 [employer]  

## 2016-06-20 NOTE — Progress Notes (Signed)
Pre visit review using our clinic review tool, if applicable. No additional management support is needed unless otherwise documented below in the visit note. 

## 2016-06-20 NOTE — Progress Notes (Signed)
Subjective:  Patient ID: Stephen Hall, male    DOB: 1980/08/03  Age: 35 y.o. MRN: [id number]  CC: Hypertension; Cough; Diabetes; and Recurrent Skin Infections   HPI Stephen Hall presents for Follow-up on the above medical problems. He just returned from a cruise.  He complains of a 4 day history of cough that has been productive of phlegm that is productive of brownish/yellow/green. He denies chest pain, night sweats, fever, chills, shortness of breath, or wheezing.  He also complains of a 3 day history of pain, redness, swelling over his right buttocks.  In addition, he is concerned that his blood sugars have not when been well controlled. He does complain of polys. He has not been taking Invokamet because it was not approved on his insurance plan.  Outpatient Medications Prior to Visit  Medication Sig Dispense Refill  . INVOKAMET 50-1000 MG TABS TAKE 1 TABLET BY MOUTH TWICE DAILY 60 tablet 3  . Doxepin HCl 3 MG TABS Take 1 tablet (3 mg total) by mouth at bedtime as needed. (Patient not taking: Reported on 02/29/2016) 30 tablet 11  . doxycycline (VIBRA-TABS) 100 MG tablet Take 1 tablet (100 mg total) by mouth 2 (two) times daily. 14 tablet 0  . Exenatide ER (BYDUREON) 2 MG PEN Inject 1 Act into the skin once a week. (Patient not taking: Reported on 02/29/2016) 4 each 11   No facility-administered medications prior to visit.     ROS Review of Systems  Constitutional: Negative for activity change, chills, diaphoresis, fatigue and fever.  HENT: Positive for sore throat. Negative for congestion, facial swelling, sinus pressure, trouble swallowing and voice change.   Eyes: Negative.   Respiratory: Positive for cough. Negative for chest tightness, shortness of breath and stridor.   Cardiovascular: Negative for chest pain, palpitations and leg swelling.  Gastrointestinal: Negative.  Negative for abdominal pain, blood in stool, constipation, diarrhea, nausea and vomiting.  Endocrine:  Positive for polydipsia, polyphagia and polyuria. Negative for cold intolerance and heat intolerance.  Genitourinary: Negative.  Negative for difficulty urinating.  Musculoskeletal: Negative.  Negative for back pain.  Skin: Positive for color change. Negative for pallor, rash and wound.  Allergic/Immunologic: Negative.   Neurological: Negative for dizziness, weakness, light-headedness and headaches.  Hematological: Negative.  Negative for adenopathy. Does not bruise/bleed easily.  Psychiatric/Behavioral: Negative.     Objective:  BP (!) 148/96 (BP Location: Right Arm, Patient Position: Sitting, Cuff Size: Large)   Pulse 100   Temp 97.9 F (36.6 C) (Oral)   Resp 16   Ht '6\' 5"'  (1.956 m)   Wt (!) 339 lb (153.8 kg)   SpO2 97%   BMI 40.20 kg/m   BP Readings from Last 3 Encounters:  06/21/16 130/90  06/20/16 (!) 148/96  02/29/16 (!) 142/90    Wt Readings from Last 3 Encounters:  06/21/16 (!) 337 lb (152.9 kg)  06/20/16 (!) 339 lb (153.8 kg)  02/29/16 (!) 360 lb (163.3 kg)    Physical Exam  Constitutional: He is oriented to person, place, and time. He appears well-developed and well-nourished.  Non-toxic appearance. He does not have a sickly appearance. He does not appear ill. No distress.  HENT:  Head: Normocephalic and atraumatic.  Mouth/Throat: No oropharyngeal exudate.  Eyes: Conjunctivae are normal. Right eye exhibits no discharge. Left eye exhibits no discharge. No scleral icterus.  Neck: Normal range of motion. Neck supple. No JVD present. No tracheal deviation present. No thyromegaly present.  Cardiovascular: Normal rate, regular rhythm, normal heart  sounds and intact distal pulses.  Exam reveals no gallop and no friction rub.   No murmur heard. Pulmonary/Chest: Effort normal and breath sounds normal. No stridor. No respiratory distress. He has no wheezes. He has no rales. He exhibits no tenderness.  Abdominal: Soft. Bowel sounds are normal. He exhibits no distension and  no mass. There is no tenderness. There is no rebound and no guarding.  Musculoskeletal: Normal range of motion. He exhibits no edema, tenderness or deformity.       Legs: The area was cleaned w Betadine and prepped and draped in sterile fashion. Local anesthesia was obtained with the instillation of 3 mL of 2% lidocaine with epi. The central area was incised with a 6 mm punch incision. A copious amount of purulent exudate was released. The opening was explored with Q-tips and hydrogen peroxide and several deep loculations were identified, disrupted, and irrigated. The cavity was then packed with iodoform. He tolerated procedure well. There is hemostasis obtained afterwards.  Lymphadenopathy:    He has no cervical adenopathy.  Neurological: He is oriented to person, place, and time.  Skin: Skin is warm and dry. No rash noted. He is not diaphoretic. No erythema. No pallor.  Vitals reviewed.   Lab Results  Component Value Date   WBC 8.0 07/09/2014   HGB 14.0 07/09/2014   HCT 42.7 07/09/2014   PLT 275.0 07/09/2014   GLUCOSE 303 (H) 02/29/2016   CHOL 227 (H) 02/29/2016   TRIG 139.0 02/29/2016   HDL 32.90 (L) 02/29/2016   LDLDIRECT 158.1 02/07/2012   LDLCALC 167 (H) 02/29/2016   ALT 38 02/29/2016   AST 29 02/29/2016   NA 135 02/29/2016   K 4.0 02/29/2016   CL 101 02/29/2016   CREATININE 0.93 02/29/2016   BUN 8 02/29/2016   CO2 26 02/29/2016   TSH 2.04 07/09/2014   HGBA1C 12.9 06/20/2016   MICROALBUR 1.1 07/09/2014    Dg Lumbar Spine Complete  Result Date: 02/19/2013 CLINICAL DATA:  Low back pain. EXAM: LUMBAR SPINE - COMPLETE 4+ VIEW COMPARISON:  None. FINDINGS: There is no evidence of lumbar spine fracture. Alignment is normal. Intervertebral disc spaces are maintained. IMPRESSION: Negative. Electronically Signed   By: Rolm Baptise   On: 02/19/2013 14:22    Assessment & Plan:   Seiji was seen today for hypertension, cough, diabetes and recurrent skin infections.  Diagnoses  and all orders for this visit:  RTI (respiratory tract infection)- chest x-ray is suspicious for possible pneumonia on the left side. Will treat with Bactrim DS. -     sulfamethoxazole-trimethoprim (BACTRIM DS,SEPTRA DS) 800-160 MG tablet; Take 1 tablet by mouth 2 (two) times daily.  Abscess of buttock, right- the abscess has been incised and drained, a culture was sent. Will empirically treat for MRSA with Bactrim and rifampin. He will return in 1-2 days for recheck and packing removal. -     WOUND CULTURE; Future -     sulfamethoxazole-trimethoprim (BACTRIM DS,SEPTRA DS) 800-160 MG tablet; Take 1 tablet by mouth 2 (two) times daily. -     rifampin (RIFADIN) 300 MG capsule; Take 1 capsule (300 mg total) by mouth 2 (two) times daily.  Cough- as above -     DG Chest 2 View; Future  Diabetes mellitus type 2 in obese ([place])- his A1c is up to 12.9%. Will change the SGLT2 inhibitor and metformin to one covered by his insurance plan. I think he should also start basal insulin. He was given his first  dose of Tresiba in the office. -     Insulin Degludec (TRESIBA FLEXTOUCH) 200 UNIT/ML SOPN; Inject 50 Units into the skin daily. -     Ambulatory referral to Endocrinology -     Amb Referral to Nutrition and Diabetic E -     Dapagliflozin-Metformin HCl ER (XIGDUO XR) 10-998 MG TB24; Take 2 tablets by mouth daily. -     glucose blood (ONETOUCH VERIO) test strip; Use TID -     Blood Glucose Monitoring Suppl (ONETOUCH VERIO FLEX SYSTEM) w/Device KIT; 1 Act by Does not apply route 3 (three) times daily. -     POCT HgB A1C -     POCT Glucose (Device for Home Use)   I have discontinued Mr. Gadway's Doxepin HCl, Exenatide ER, doxycycline, and INVOKAMET. I am also having him start on sulfamethoxazole-trimethoprim, rifampin, Insulin Degludec, Dapagliflozin-Metformin HCl ER, glucose blood, and ONETOUCH VERIO FLEX SYSTEM.  Meds ordered this encounter  Medications  . sulfamethoxazole-trimethoprim (BACTRIM  DS,SEPTRA DS) 800-160 MG tablet    Sig: Take 1 tablet by mouth 2 (two) times daily.    Dispense:  20 tablet    Refill:  0  . rifampin (RIFADIN) 300 MG capsule    Sig: Take 1 capsule (300 mg total) by mouth 2 (two) times daily.    Dispense:  20 capsule    Refill:  0  . Insulin Degludec (TRESIBA FLEXTOUCH) 200 UNIT/ML SOPN    Sig: Inject 50 Units into the skin daily.    Dispense:  3 mL    Refill:  11  . Dapagliflozin-Metformin HCl ER (XIGDUO XR) 10-998 MG TB24    Sig: Take 2 tablets by mouth daily.    Dispense:  180 tablet    Refill:  1  . glucose blood (ONETOUCH VERIO) test strip    Sig: Use TID    Dispense:  100 each    Refill:  12  . Blood Glucose Monitoring Suppl (ONETOUCH VERIO FLEX SYSTEM) w/Device KIT    Sig: 1 Act by Does not apply route 3 (three) times daily.    Dispense:  2 kit    Refill:  0     Follow-up: Return in about 1 day (around 06/21/2016).  Scarlette Calico, MD

## 2016-06-21 ENCOUNTER — Ambulatory Visit: Payer: BC Managed Care – PPO | Admitting: Internal Medicine

## 2016-06-21 ENCOUNTER — Encounter: Payer: Self-pay | Admitting: Internal Medicine

## 2016-06-21 VITALS — BP 130/90 | HR 95 | Temp 98.0°F | Resp 16 | Ht 77.0 in | Wt 337.0 lb

## 2016-06-21 DIAGNOSIS — J988 Other specified respiratory disorders: Secondary | ICD-10-CM

## 2016-06-21 DIAGNOSIS — L0231 Cutaneous abscess of buttock: Secondary | ICD-10-CM

## 2016-06-21 NOTE — Patient Instructions (Signed)
Skin Abscess A skin abscess is an infected area on or under your skin that contains a collection of pus and other material. An abscess may also be called a furuncle, carbuncle, or boil. An abscess can occur in or on almost any part of your body. Some abscesses break open (rupture) on their own. Most continue to get worse unless they are treated. The infection can spread deeper into the body and eventually into your blood, which can make you feel ill. Treatment usually involves draining the abscess. What are the causes? An abscess occurs when germs, often bacteria, pass through your skin and cause an infection. This may be caused by:  A scrape or cut on your skin.  A puncture wound through your skin, including a needle injection.  Blocked oil or sweat glands.  Blocked and infected hair follicles.  A cyst that forms beneath your skin (sebaceous cyst) and becomes infected. What increases the risk? This condition is more likely to develop in people who:  Have a weak body defense system (immune system).  Have diabetes.  Have dry and irritated skin.  Get frequent injections or use illegal IV drugs.  Have a foreign body in a wound, such as a splinter.  Have problems with their lymph system or veins. What are the signs or symptoms? An abscess may start as a painful, firm bump under the skin. Over time, the abscess may get larger or become softer. Pus may appear at the top of the abscess, causing pressure and pain. It may eventually break through the skin and drain. Other symptoms include:  Redness.  Warmth.  Swelling.  Tenderness.  A sore on the skin. How is this diagnosed? This condition is diagnosed based on your medical history and a physical exam. A sample of pus may be taken from the abscess to find out what is causing the infection and what antibiotics can be used to treat it. You also may have:  Blood tests to look for signs of infection or spread of an infection to your  blood.  Imaging studies such as ultrasound, CT scan, or MRI if the abscess is deep. How is this treated? Small abscesses that drain on their own may not need treatment. Treatment for an abscess that does not rupture on its own may include:  Warm compresses applied to the area several times per day.  Incision and drainage. Your health care provider will make an incision to open the abscess and will remove pus and any foreign body or dead tissue. The incision area may be packed with gauze to keep it open for a few days while it heals.  Antibiotic medicines to treat infection. For a severe abscess, you may first get antibiotics through an IV and then change to oral antibiotics. Follow these instructions at home: Abscess Care   If you have an abscess that has not drained, place a warm, clean, wet washcloth over the abscess several times a day. Do this as told by your health care provider.  Follow instructions from your health care provider about how to take care of your abscess. Make sure you:  Cover the abscess with a bandage (dressing).  Change your dressing or gauze as told by your health care provider.  Wash your hands with soap and water before you change the dressing or gauze. If soap and water are not available, use hand sanitizer.  Check your abscess every day for signs of a worsening infection. Check for:  More redness, swelling, or   pain.  More fluid or blood.  Warmth.  More pus or a bad smell. Medicines   Take over-the-counter and prescription medicines only as told by your health care provider.  If you were prescribed an antibiotic medicine, take it as told by your health care provider. Do not stop taking the antibiotic even if you start to feel better. General instructions   To avoid spreading the infection:  Do not share personal care items, towels, or hot tubs with others.  Avoid making skin contact with other people.  Keep all follow-up visits as told by your  health care provider. This is important. Contact a health care provider if:  You have more redness, swelling, or pain around your abscess.  You have more fluid or blood coming from your abscess.  Your abscess feels warm to the touch.  You have more pus or a bad smell coming from your abscess.  You have a fever.  You have muscle aches.  You have chills or a general ill feeling. Get help right away if:  You have severe pain.  You see red streaks on your skin spreading away from the abscess. This information is not intended to replace advice given to you by your health care provider. Make sure you discuss any questions you have with your health care provider. Document Released: 03/28/2005 Document Revised: 02/12/2016 Document Reviewed: 04/27/2015 Elsevier Interactive Patient Education  2017 [employer]  

## 2016-06-21 NOTE — Progress Notes (Signed)
Pre visit review using our clinic review tool, if applicable. No additional management support is needed unless otherwise documented below in the visit note. 

## 2016-06-23 LAB — WOUND CULTURE: Gram Stain: NONE SEEN

## 2016-06-26 NOTE — Progress Notes (Signed)
Subjective:  Patient ID: Stephen Hall, male    DOB: 10-Dec-1980  Age: 35 y.o. MRN: [id number]  CC: Wound Check and Cough   HPI Shooter Tangen presents for a 1 day follow-up after incision and drainage of his right buttocks. The culture is positive for staph. He tells me the area has improved significantly with less redness, pain, swelling. He is tolerating the antibiotics well with no rash, abdominal pain, nausea, or vomiting.  He tells me his cough is getting much better. He has not produced any blood or phlegm since I last saw him. He denies shortness of breath, chest pain, fever, chills, or night sweats.  Outpatient Medications Prior to Visit  Medication Sig Dispense Refill  . Blood Glucose Monitoring Suppl (ONETOUCH VERIO FLEX SYSTEM) w/Device KIT 1 Act by Does not apply route 3 (three) times daily. 2 kit 0  . Dapagliflozin-Metformin HCl ER (XIGDUO XR) 10-998 MG TB24 Take 2 tablets by mouth daily. 180 tablet 1  . glucose blood (ONETOUCH VERIO) test strip Use TID 100 each 12  . Insulin Degludec (TRESIBA FLEXTOUCH) 200 UNIT/ML SOPN Inject 50 Units into the skin daily. 3 mL 11  . rifampin (RIFADIN) 300 MG capsule Take 1 capsule (300 mg total) by mouth 2 (two) times daily. 20 capsule 0  . sulfamethoxazole-trimethoprim (BACTRIM DS,SEPTRA DS) 800-160 MG tablet Take 1 tablet by mouth 2 (two) times daily. 20 tablet 0   No facility-administered medications prior to visit.     ROS Review of Systems  Constitutional: Negative for chills, fatigue and unexpected weight change.  Eyes: Negative.   Respiratory: Positive for cough. Negative for chest tightness, shortness of breath and wheezing.   Cardiovascular: Negative.  Negative for chest pain, palpitations and leg swelling.  Gastrointestinal: Negative for abdominal pain, diarrhea, nausea and vomiting.  Endocrine: Negative.   Genitourinary: Negative.  Negative for difficulty urinating, dysuria, frequency, hematuria, testicular pain and  urgency.  Musculoskeletal: Negative.  Negative for back pain and neck pain.  Skin: Negative.  Negative for color change and rash.  Allergic/Immunologic: Negative.   Neurological: Negative.   Hematological: Negative.  Negative for adenopathy. Does not bruise/bleed easily.  Psychiatric/Behavioral: Negative.     Objective:  BP 130/90 (BP Location: Right Arm, Patient Position: Sitting, Cuff Size: Large)   Pulse 95   Temp 98 F (36.7 C) (Oral)   Resp 16   Ht '6\' 5"'  (1.956 m)   Wt (!) 337 lb (152.9 kg)   SpO2 97%   BMI 39.96 kg/m   BP Readings from Last 3 Encounters:  06/21/16 130/90  06/20/16 (!) 148/96  02/29/16 (!) 142/90    Wt Readings from Last 3 Encounters:  06/21/16 (!) 337 lb (152.9 kg)  06/20/16 (!) 339 lb (153.8 kg)  02/29/16 (!) 360 lb (163.3 kg)    Physical Exam  Constitutional: He is oriented to person, place, and time. He appears well-developed and well-nourished.  Non-toxic appearance. He does not have a sickly appearance. He does not appear ill. No distress.  HENT:  Mouth/Throat: Oropharynx is clear and moist. No oropharyngeal exudate.  Eyes: Conjunctivae are normal. Right eye exhibits no discharge. Left eye exhibits no discharge. No scleral icterus.  Neck: Normal range of motion. Neck supple. No JVD present. No tracheal deviation present. No thyromegaly present.  Cardiovascular: Normal rate, regular rhythm, normal heart sounds and intact distal pulses.  Exam reveals no gallop and no friction rub.   No murmur heard. Pulmonary/Chest: Effort normal and breath sounds normal. No  stridor. No respiratory distress. He has no wheezes. He has no rales. He exhibits no tenderness.  Abdominal: Soft. Bowel sounds are normal. He exhibits no distension and no mass. There is no tenderness. There is no rebound and no guarding.  Musculoskeletal: Normal range of motion. He exhibits no edema, tenderness or deformity.       Legs: Lymphadenopathy:    He has no cervical adenopathy.    Neurological: He is oriented to person, place, and time.  Skin: Skin is warm and dry. No rash noted. He is not diaphoretic. No erythema. No pallor.  Vitals reviewed.   Lab Results  Component Value Date   WBC 8.0 07/09/2014   HGB 14.0 07/09/2014   HCT 42.7 07/09/2014   PLT 275.0 07/09/2014   GLUCOSE 303 (H) 02/29/2016   CHOL 227 (H) 02/29/2016   TRIG 139.0 02/29/2016   HDL 32.90 (L) 02/29/2016   LDLDIRECT 158.1 02/07/2012   LDLCALC 167 (H) 02/29/2016   ALT 38 02/29/2016   AST 29 02/29/2016   NA 135 02/29/2016   K 4.0 02/29/2016   CL 101 02/29/2016   CREATININE 0.93 02/29/2016   BUN 8 02/29/2016   CO2 26 02/29/2016   TSH 2.04 07/09/2014   HGBA1C 12.9 06/20/2016   MICROALBUR 1.1 07/09/2014    Dg Chest 2 View  Result Date: 06/20/2016 CLINICAL DATA:  Cough, chest congestion, and weakness for the past 4 days. History of asthma, diabetes, nonsmoker. EXAM: CHEST  2 VIEW COMPARISON:  None in PACs FINDINGS: The lungs are reasonably well inflated. There is linear increased density in the lingula. There is no alveolar infiltrate or pleural effusion. The heart and pulmonary vascularity are normal. The mediastinum is normal in width. The bony thorax is unremarkable. IMPRESSION: Mildly increased lung markings in the lingula consistent with subsegmental atelectasis or early interstitial pneumonia. Followup PA and lateral chest X-ray is recommended in 3-4 weeks following trial of antibiotic therapy to ensure resolution and exclude underlying malignancy. Electronically Signed   By: David  Martinique M.D.   On: 06/20/2016 15:22    Assessment & Plan:   Eathen was seen today for wound check and cough.  Diagnoses and all orders for this visit:  Abscess of buttock, right- some improvement noted, the abscess was repacked with iodoform, the culture is positive for staph so will continue the combination of Bactrim and rifampin to cover MRSA.  RTI (respiratory tract infection)- his chest x-ray was  slightly abnormal left side so I'm treating this as pneumonia with Bactrim DS. He tells me that he has improved over last 24 hours.   I am having Mr. Svec maintain his sulfamethoxazole-trimethoprim, rifampin, Insulin Degludec, Dapagliflozin-Metformin HCl ER, glucose blood, and ONETOUCH VERIO FLEX SYSTEM.  No orders of the defined types were placed in this encounter.    Follow-up: Return in about 5 days (around 06/26/2016).  Scarlette Calico, MD

## 2016-06-27 ENCOUNTER — Encounter: Payer: Self-pay | Admitting: Internal Medicine

## 2016-06-27 ENCOUNTER — Ambulatory Visit (INDEPENDENT_AMBULATORY_CARE_PROVIDER_SITE_OTHER): Payer: BC Managed Care – PPO | Admitting: Internal Medicine

## 2016-06-27 VITALS — BP 130/74 | HR 100 | Temp 98.0°F | Resp 20 | Wt 335.0 lb

## 2016-06-27 DIAGNOSIS — J988 Other specified respiratory disorders: Secondary | ICD-10-CM

## 2016-06-27 DIAGNOSIS — L0231 Cutaneous abscess of buttock: Secondary | ICD-10-CM

## 2016-06-27 DIAGNOSIS — R5383 Other fatigue: Secondary | ICD-10-CM | POA: Diagnosis not present

## 2016-06-27 MED ORDER — ALBUTEROL SULFATE HFA 108 (90 BASE) MCG/ACT IN AERS: 2.0000 | INHALATION_SPRAY | Freq: Four times a day (QID) | RESPIRATORY_TRACT | 1 refills | Status: DC | PRN

## 2016-06-27 NOTE — Progress Notes (Signed)
Pre visit review using our clinic review tool, if applicable. No additional management support is needed unless otherwise documented below in the visit note. 

## 2016-06-27 NOTE — Patient Instructions (Addendum)
Your packing was removed today  OK to finish antibiotic, and every 2-3 days bandaid changes  Please take all new medication as prescribed  - the inhaler  Please continue all other medications as before, and refills have been done if requested.  Please have the pharmacy call with any other refills you may need.  Please keep your appointments with your specialists as you may have planned  Please return in 2 weeks to Dr Yetta BarreJones, or sooner if needed

## 2016-06-27 NOTE — Assessment & Plan Note (Addendum)
Clinically improved, likely resolved, tried to reassure pt over recent "pna" by cxr; no wheezing by exam but for trial proair HFA prn dyspnea

## 2016-06-27 NOTE — Progress Notes (Signed)
Subjective:    Patient ID: Stephen Hall, male    DOB: [birth year], 35 y.o.   MRN: [id number]  HPI   Here to f/u in Dr Ronnald Ramp abscence, pt seen 1 wk ago with right buttock abscess, cx + for MSSA, tx with I and D, then septra course he will finish in another 3 days, tolerates well for now.  Wife is wound nurse, has done some packing every few days since original date of tx.  No pus in last few days and overall site with marked reduction overall in pain, swelling, induration. No fever and Pt denies chest pain, orthopnea, PND, increased LE swelling, palpitations, dizziness or syncope, though c/o persistent voice change and non prod cough, mild sob assoc with recent ? Viral illness with abnormal cxr suggestive of PNA but not convincing.  Pt very concerned, anxious about possibly getting worse, but no fever, ST, cough or wheezing.  Does c/o ongoing fatigue, but denies signficant daytime hypersomnolence. Very concerned he does not seem to be improving as fast as he anticipated Past Medical History:  Diagnosis Date  . Asthma   . Diabetes mellitus without complication ([place])   . Environmental allergies    No past surgical history on file.  reports that he has never smoked. He has never used smokeless tobacco. He reports that he does not drink alcohol or use drugs. family history includes Alcohol abuse in his other; Arthritis in his other; Diabetes in his other; Hypertension in his father, mother, and other; Obesity in his father and mother. No Known Allergies Current Outpatient Prescriptions on File Prior to Visit  Medication Sig Dispense Refill  . Blood Glucose Monitoring Suppl (ONETOUCH VERIO FLEX SYSTEM) w/Device KIT 1 Act by Does not apply route 3 (three) times daily. 2 kit 0  . Dapagliflozin-Metformin HCl ER (XIGDUO XR) 10-998 MG TB24 Take 2 tablets by mouth daily. 180 tablet 1  . glucose blood (ONETOUCH VERIO) test strip Use TID 100 each 12  . Insulin Degludec (TRESIBA FLEXTOUCH) 200 UNIT/ML SOPN  Inject 50 Units into the skin daily. 3 mL 11  . rifampin (RIFADIN) 300 MG capsule Take 1 capsule (300 mg total) by mouth 2 (two) times daily. 20 capsule 0  . sulfamethoxazole-trimethoprim (BACTRIM DS,SEPTRA DS) 800-160 MG tablet Take 1 tablet by mouth 2 (two) times daily. 20 tablet 0   No current facility-administered medications on file prior to visit.    Review of Systems  Constitutional: Negative for unusual diaphoresis or night sweats HENT: Negative for ear swelling or discharge Eyes: Negative for worsening visual haziness  Respiratory: Negative for choking and stridor.   Gastrointestinal: Negative for distension or worsening eructation Genitourinary: Negative for retention or change in urine volume.  Musculoskeletal: Negative for other MSK pain or swelling Skin: Negative for color change and worsening wound Neurological: Negative for tremors and numbness other than noted  Psychiatric/Behavioral: Negative for decreased concentration or agitation other than above   All other system neg per pt    Objective:   Physical Exam BP 130/74   Pulse 100   Temp 98 F (36.7 C) (Oral)   Resp 20   Wt (!) 335 lb (152 kg)   SpO2 97%   BMI 39.73 kg/m  VS noted,  Constitutional: Pt appears in no apparent distress HENT: Head: NCAT.  Right Ear: External ear normal.  Left Ear: External ear normal.  Eyes: . Pupils are equal, round, and reactive to light. Conjunctivae and EOM are normal Neck: Normal range of  motion. Neck supple.  Cardiovascular: Normal rate and regular rhythm.   Pulmonary/Chest: Effort normal and breath sounds without rales or wheezing.  Right buttock with mid aspect abscess site with slight bleeding but no pus or other drainage, with only 1/2 cm surrounding swelling and induration, mild tender at worst Neurological: Pt is alert. Not confused , motor grossly intact Skin: Skin is warm. No rash, no LE edema Psychiatric: Pt behavior is normal. No agitation.  2+ nervous No other  changes to exam  Lab Results  Component Value Date   WBC 8.0 07/09/2014   HGB 14.0 07/09/2014   HCT 42.7 07/09/2014   PLT 275.0 07/09/2014   GLUCOSE 303 (H) 02/29/2016   CHOL 227 (H) 02/29/2016   TRIG 139.0 02/29/2016   HDL 32.90 (L) 02/29/2016   LDLDIRECT 158.1 02/07/2012   LDLCALC 167 (H) 02/29/2016   ALT 38 02/29/2016   AST 29 02/29/2016   NA 135 02/29/2016   K 4.0 02/29/2016   CL 101 02/29/2016   CREATININE 0.93 02/29/2016   BUN 8 02/29/2016   CO2 26 02/29/2016   TSH 2.04 07/09/2014   HGBA1C 12.9 06/20/2016   MICROALBUR 1.1 07/09/2014   CHEST  2 VIEW 06/20/2016 IMPRESSION: Mildly increased lung markings in the lingula consistent with subsegmental atelectasis or early interstitial pneumonia. Followup PA and lateral chest X-ray is recommended in 3-4 weeks following trial of antibiotic therapy to ensure resolution and exclude underlying malignancy.    Assessment & Plan:

## 2016-06-27 NOTE — Assessment & Plan Note (Signed)
With MSSA by cx, now improved, no longer requires packing, to finish antibx, bandaid prn,  to f/u any worsening symptoms or concerns

## 2016-06-27 NOTE — Assessment & Plan Note (Signed)
Most likely post infectious related, d/w pt, exam ok today, to finish antibiotic, and cont to monitor, consider lab testing if not improved

## 2016-07-03 ENCOUNTER — Telehealth: Payer: Self-pay | Admitting: Internal Medicine

## 2016-07-03 DIAGNOSIS — E669 Obesity, unspecified: Principal | ICD-10-CM

## 2016-07-03 DIAGNOSIS — E1169 Type 2 diabetes mellitus with other specified complication: Secondary | ICD-10-CM

## 2016-07-03 MED ORDER — LANCETS MISC: 2 refills | Status: DC

## 2016-07-03 MED FILL — ONE TOUCH DELICA 33G LANCET: 50 days supply | Qty: 100 | Fill #0

## 2016-07-03 NOTE — Telephone Encounter (Signed)
Patient is requesting script for lancets for one touch verio flex to be [facility].

## 2016-07-03 NOTE — Telephone Encounter (Signed)
erx sent

## 2016-07-21 NOTE — Progress Notes (Signed)
Subjective:    Patient ID: Stephen Hall, male    DOB: 01/31/1981, 36 y.o.   MRN: [id number]  HPI pt is referred by Dr Ronnald Ramp, for diabetes.  Pt states DM was dx'ed in 2015; he has mild if any neuropathy of the lower extremities; he is unaware of any associated chronic complications; he has been on insulin since late 2017; pt says his diet and exercise are fair; he has never had pancreatitis, severe hypoglycemia or DKA. Since on the insulin, pt states he feels better in general.  He says cbg's are in the low-100's fasting.   He denies hypoglycemia.   Past Medical History:  Diagnosis Date  . Asthma   . Diabetes mellitus without complication ([place])   . Environmental allergies     No past surgical history on file.  Social History   Social History  . Marital status: Married    Spouse name: N/A  . Number of children: N/A  . Years of education: N/A   Occupational History  . Not on file.   Social History Main Topics  . Smoking status: Never Smoker  . Smokeless tobacco: Never Used  . Alcohol use No  . Drug use: No  . Sexual activity: Yes   Other Topics Concern  . Not on file   Social History Narrative   Caffienated drinks-yes   Seat belt use often-yes   Regular Exercise-no   Smoke alarm in the home-yes   Firearms/guns in the home-no   History of physical abuse-no                Current Outpatient Prescriptions on File Prior to Visit  Medication Sig Dispense Refill  . albuterol (PROAIR HFA) 108 (90 Base) MCG/ACT inhaler Inhale 2 puffs into the lungs every 6 (six) hours as needed for wheezing or shortness of breath. 1 Inhaler 1  . Blood Glucose Monitoring Suppl (ONETOUCH VERIO FLEX SYSTEM) w/Device KIT 1 Act by Does not apply route 3 (three) times daily. 2 kit 0  . Dapagliflozin-Metformin HCl ER (XIGDUO XR) 10-998 MG TB24 Take 2 tablets by mouth daily. 180 tablet 1  . glucose blood (ONETOUCH VERIO) test strip Use TID 100 each 12  . Insulin Degludec (TRESIBA FLEXTOUCH)  200 UNIT/ML SOPN Inject 50 Units into the skin daily. 3 mL 11  . Lancets MISC Use to check blood sugar twice daily. 100 each 2   No current facility-administered medications on file prior to visit.     No Known Allergies  Family History  Problem Relation Age of Onset  . Alcohol abuse Other   . Arthritis Other   . Hypertension Other   . Diabetes Other   . Obesity Mother   . Hypertension Mother   . Obesity Father   . Hypertension Father   . Cancer Neg Hx   . Heart disease Neg Hx   . Stroke Neg Hx   . Kidney disease Neg Hx     BP 122/82   Pulse 94   Ht 6' 5" (1.956 m)   Wt (!) 342 lb (155.1 kg)   SpO2 95%   BMI 40.56 kg/m    Review of Systems denies weight loss, blurry vision, headache, chest pain, n/v, urinary frequency, muscle cramps, depression, cold intolerance, rhinorrhea, and easy bruising.  He has slight doe and diaphoresis.      Objective:   Physical Exam VS: see vs page GEN: no distress HEAD: head: no deformity eyes: no periorbital swelling, no proptosis external  nose and ears are normal mouth: no lesion seen NECK: supple, thyroid is not enlarged CHEST WALL: no deformity LUNGS: clear to auscultation CV: reg rate and rhythm, no murmur ABD: abdomen is soft, nontender.  no hepatosplenomegaly.  not distended.  no hernia MUSCULOSKELETAL: muscle bulk and strength are grossly normal.  no obvious joint swelling.  gait is normal and steady EXTEMITIES: no deformity.  no ulcer on the feet.  feet are of normal color and temp.  Trace bilat leg edema PULSES: dorsalis pedis intact bilat.  no carotid bruit NEURO:  cn 2-12 grossly intact.   readily moves all 4's.  sensation is intact to touch on the feet SKIN:  Normal texture and temperature.  No rash or suspicious lesion is visible.   NODES:  None palpable at the neck PSYCH: alert, well-oriented.  Does not appear anxious nor depressed.   Lab Results  Component Value Date   HGBA1C 12.9 06/20/2016   I have reviewed  outside records, and summarized: Pt was noted to have severely elevated a1c, and referred here.  At last PCP ov, he was rx'ed for abscess of the buttock.    Lab Results  Component Value Date   TSH 2.49 07/23/2016      Assessment & Plan:  Insulin-requiring type 2 DM: severe exacerbation. Morbid obesity, new to me.  Abscess of buttock, prob contributed to be severe hyperglycemia.  DOE: I told pt this was prob due to deconditioning.    Patient is advised the following: Patient Instructions  good diet and exercise significantly improve the control of your diabetes.  please let me know if you wish to be referred to a dietician.  high blood sugar is very risky to your health.  you should see an eye doctor and dentist every year.  It is very important to get all recommended vaccinations.  Controlling your blood pressure and cholesterol drastically reduces the damage diabetes does to your body.  Those who smoke should quit.  Please discuss these with your doctor.  check your blood sugar twice a day.  vary the time of day when you check, between before the 3 meals, and at bedtime.  also check if you have symptoms of your blood sugar being too high or too low.  please keep a record of the readings and bring it to your next appointment here (or you can bring the meter itself).  You can write it on any piece of paper.  please call us sooner if your blood sugar goes below 70, or if you have a lot of readings over 200. Please continue the same medications for diabetes now.   A diabetes blood test is requested for you today.  We'll let you know about the results.   Please come back for a follow-up appointment in 3 months.      Bariatric Surgery You have so much to gain by losing weight.  You may have already tried every diet and exercise plan imaginable.  And, you may have sought advice from your family physician, too.   Sometimes, in spite of such diligent efforts, you may not be able to achieve  long-term results by yourself.  In cases of severe obesity, bariatric or weight loss surgery is a proven method of achieving long-term weight control.  Our Services Our bariatric surgery programs offer our patients new hope and long-term weight-loss solution.  Since introducing our services in 2003, we have conducted more than 2,400 successful procedures.  Our program is designated as  a [place] by the Metabolic and Bariatric Surgery Accreditation and Quality Improvement Program [facility]), a national accrediting body that sets rigorous patient safety and outcome standards.  Our program is also designated as a [place] by [employer].   Our exceptional weight-loss surgery team specializes in diagnosis, treatment, follow-up care, and ongoing support for our patients with severe weight loss challenges.  We currently offer laparoscopic sleeve gastrectomy, gastric bypass, and adjustable gastric band (LAP-BAND).    Attend our [place] Choosing to undergo a bariatric procedure is a big decision, and one that should not be taken lightly.  You now have two options in how you learn about weight-loss surgery - in person or online.  Our objective is to ensure you have all of the information that you need to evaluate the advantages and obligations of this life changing procedure.  Please note that you are not alone in this process, and our experienced team is ready to assist and answer all of your questions.  There are several ways to register for a seminar (either on-line or in person): 1)  Call [phone] 2) Go on-line to [facility] and register for either type of seminar.  [email or web address]

## 2016-07-23 ENCOUNTER — Encounter: Payer: Self-pay | Admitting: Endocrinology

## 2016-07-23 ENCOUNTER — Ambulatory Visit: Payer: BC Managed Care – PPO | Admitting: Registered"

## 2016-07-23 ENCOUNTER — Ambulatory Visit (INDEPENDENT_AMBULATORY_CARE_PROVIDER_SITE_OTHER): Payer: BC Managed Care – PPO | Admitting: Endocrinology

## 2016-07-23 VITALS — BP 122/82 | HR 94 | Ht 77.0 in | Wt 342.0 lb

## 2016-07-23 DIAGNOSIS — Z0000 Encounter for general adult medical examination without abnormal findings: Secondary | ICD-10-CM

## 2016-07-23 DIAGNOSIS — E669 Obesity, unspecified: Secondary | ICD-10-CM

## 2016-07-23 DIAGNOSIS — E1169 Type 2 diabetes mellitus with other specified complication: Secondary | ICD-10-CM

## 2016-07-23 LAB — HEPATIC FUNCTION PANEL
ALT: 18 U/L (ref 0–53)
AST: 12 U/L (ref 0–37)
Albumin: 4.3 g/dL (ref 3.5–5.2)
Alkaline Phosphatase: 76 U/L (ref 39–117)
BILIRUBIN DIRECT: 0.1 mg/dL (ref 0.0–0.3)
BILIRUBIN TOTAL: 0.2 mg/dL (ref 0.2–1.2)
Total Protein: 7.6 g/dL (ref 6.0–8.3)

## 2016-07-23 LAB — BASIC METABOLIC PANEL
BUN: 13 mg/dL (ref 6–23)
CALCIUM: 9.5 mg/dL (ref 8.4–10.5)
CHLORIDE: 104 meq/L (ref 96–112)
CO2: 25 mEq/L (ref 19–32)
CREATININE: 0.87 mg/dL (ref 0.40–1.50)
GFR: 128.98 mL/min (ref >60.00)
Glucose, Bld: 92 mg/dL (ref 70–99)
Potassium: 4.1 mEq/L (ref 3.5–5.1)
Sodium: 136 mEq/L (ref 135–145)

## 2016-07-23 LAB — CBC WITH DIFFERENTIAL/PLATELET
BASOS ABS: 0.1 10*3/uL (ref 0.0–0.1)
BASOS PCT: 0.8 % (ref 0.0–3.0)
Eosinophils Absolute: 0.1 10*3/uL (ref 0.0–0.7)
Eosinophils Relative: 1.5 % (ref 0.0–5.0)
HCT: 43.2 % (ref 39.0–52.0)
Hemoglobin: 14.1 g/dL (ref 13.0–17.0)
Lymphocytes Relative: 33.5 % (ref 12.0–46.0)
Lymphs Abs: 3.3 10*3/uL (ref 0.7–4.0)
MCHC: 32.7 g/dL (ref 30.0–36.0)
MCV: 75.7 fl — AB (ref 78.0–100.0)
MONOS PCT: 8.9 % (ref 3.0–12.0)
Monocytes Absolute: 0.9 10*3/uL (ref 0.1–1.0)
NEUTROS PCT: 55.3 % (ref 43.0–77.0)
Neutro Abs: 5.4 10*3/uL (ref 1.4–7.7)
Platelets: 256 10*3/uL (ref 150.0–400.0)
RBC: 5.71 Mil/uL (ref 4.22–5.81)
RDW: 15.7 % — AB (ref 11.5–15.5)
WBC: 9.7 10*3/uL (ref 4.0–10.5)

## 2016-07-23 LAB — LIPID PANEL
Cholesterol: 199 mg/dL (ref 0–200)
HDL: 35.3 mg/dL — ABNORMAL LOW (ref >39.00)
LDL Cholesterol: 142 mg/dL — ABNORMAL HIGH (ref 0–99)
NONHDL: 163.43
Total CHOL/HDL Ratio: 6
Triglycerides: 109 mg/dL (ref 0.0–149.0)
VLDL: 21.8 mg/dL (ref 0.0–40.0)

## 2016-07-23 LAB — MICROALBUMIN / CREATININE URINE RATIO
CREATININE, U: 163 mg/dL
MICROALB/CREAT RATIO: 1 mg/g (ref 0.0–30.0)
Microalb, Ur: 1.6 mg/dL (ref 0.0–1.9)

## 2016-07-23 LAB — TSH: TSH: 2.49 u[IU]/mL (ref 0.35–4.50)

## 2016-07-23 NOTE — Patient Instructions (Addendum)
good diet and exercise significantly improve the control of your diabetes.  please let me know if you wish to be referred to a dietician.  high blood sugar is very risky to your health.  you should see an eye doctor and dentist every year.  It is very important to get all recommended vaccinations.  Controlling your blood pressure and cholesterol drastically reduces the damage diabetes does to your body.  Those who smoke should quit.  Please discuss these with your doctor.  check your blood sugar twice a day.  vary the time of day when you check, between before the 3 meals, and at bedtime.  also check if you have symptoms of your blood sugar being too high or too low.  please keep a record of the readings and bring it to your next appointment here (or you can bring the meter itself).  You can write it on any piece of paper.  please call us sooner if your blood sugar goes below 70, or if you have a lot of readings over 200. Please continue the same medications for diabetes now.   A diabetes blood test is requested for you today.  We'll let you know about the results.   Please come back for a follow-up appointment in 3 months.      Bariatric Surgery You have so much to gain by losing weight.  You may have already tried every diet and exercise plan imaginable.  And, you may have sought advice from your family physician, too.   Sometimes, in spite of such diligent efforts, you may not be able to achieve long-term results by yourself.  In cases of severe obesity, bariatric or weight loss surgery is a proven method of achieving long-term weight control.  Our Services Our bariatric surgery programs offer our patients new hope and long-term weight-loss solution.  Since introducing our services in 2003, we have conducted more than 2,400 successful procedures.  Our program is designated as a Investment banker, [facility] Center by the Metabolic and Bariatric Surgery Accreditation and Quality Improvement Program (MBSAQIP), a  Child psychotherapistnational accrediting body that sets rigorous patient safety and outcome standards.  Our program is also designated as a Engineer, manufacturing systemsCenter of Excellence by [employer] insurance companies.   Our exceptional weight-loss surgery team specializes in diagnosis, treatment, follow-up care, and ongoing support for our patients with severe weight loss challenges.  We currently offer laparoscopic sleeve gastrectomy, gastric bypass, and adjustable gastric band (LAP-BAND).    Attend our Bariatrics Seminar Choosing to undergo a bariatric procedure is a big decision, and one that should not be taken lightly.  You now have two options in how you learn about weight-loss surgery - in person or online.  Our objective is to ensure you have all of the information that you need to evaluate the advantages and obligations of this life changing procedure.  Please note that you are not alone in this process, and our experienced team is ready to assist and answer all of your questions.  There are several ways to register for a seminar (either on-line or in person): 1)  Call [phone] 2) Go on-line to [facility] and register for either type of seminar.  [email or web address]

## 2016-07-25 LAB — FRUCTOSAMINE: FRUCTOSAMINE: 209 umol/L (ref 190–270)

## 2016-08-15 MED FILL — TRESIBA FLEXTOUCH 200 UNITS: 200 | 30 days supply | Qty: 9 | Fill #1

## 2016-09-26 ENCOUNTER — Encounter: Payer: Self-pay | Admitting: Family Medicine

## 2016-09-26 ENCOUNTER — Ambulatory Visit (INDEPENDENT_AMBULATORY_CARE_PROVIDER_SITE_OTHER): Payer: BC Managed Care – PPO | Admitting: Family Medicine

## 2016-09-26 VITALS — BP 130/90 | HR 97 | Temp 98.6°F | Wt 343.3 lb

## 2016-09-26 DIAGNOSIS — J069 Acute upper respiratory infection, unspecified: Secondary | ICD-10-CM

## 2016-09-26 DIAGNOSIS — B9789 Other viral agents as the cause of diseases classified elsewhere: Secondary | ICD-10-CM

## 2016-09-26 NOTE — Progress Notes (Signed)
Subjective:     Patient ID: Stephen Hall, male   DOB: [birth year], 36 y.o.   MRN: [id number]  HPI Patient seen for acute visit. Onset Friday of cough, sore throat, rhinorrhea. No fever. No body aches. Cough productive of green sputum mostly early in the mornings but then clears. Nonsmoker. Remote history of asthma. No wheezing or dyspnea. He has type 2 diabetes which has been poorly controlled.  Past Medical History:  Diagnosis Date  . Asthma   . Diabetes mellitus without complication ([facility])   . Environmental allergies    No past surgical history on file.  reports that he has never smoked. He has never used smokeless tobacco. He reports that he does not drink alcohol or use drugs. family history includes Alcohol abuse in his other; Arthritis in his other; Diabetes in his other; Hypertension in his father, mother, and other; Obesity in his father and mother. No Known Allergies   Review of Systems  Constitutional: Negative for chills and fever.  HENT: Positive for congestion and sore throat.   Respiratory: Positive for cough.        Objective:   Physical Exam  Constitutional: He appears well-developed and well-nourished.  HENT:  Right Ear: External ear normal.  Left Ear: External ear normal.  Mouth/Throat: Oropharynx is clear and moist.  Neck: Neck supple.  Cardiovascular: Normal rate and regular rhythm.   Pulmonary/Chest: Effort normal and breath sounds normal. No respiratory distress. He has no wheezes. He has no rales.  Lymphadenopathy:    He has no cervical adenopathy.       Assessment:     Probable viral URI with cough. Nonfocal exam    Plan:     -Treat symptomatically. -Consider over-the-counter plain Mucinex twice daily -Follow-up promptly for any fever or worsening symptoms  Kristian CoveyBruce W Jurgen Groeneveld MD [facility]

## 2016-09-26 NOTE — Patient Instructions (Signed)

## 2016-09-26 NOTE — Progress Notes (Signed)
Pre visit review using our clinic review tool, if applicable. No additional management support is needed unless otherwise documented below in the visit note. 

## 2016-10-05 MED FILL — XIGDUO XR 5 MG-1,000 MG TAB: 5-1000 | 90 days supply | Qty: 180 | Fill #1

## 2016-10-22 ENCOUNTER — Ambulatory Visit: Payer: BC Managed Care – PPO | Admitting: Endocrinology

## 2016-10-26 MED FILL — ONE TOUCH DELICA 33G LANCET: 50 days supply | Qty: 100 | Fill #1

## 2016-10-26 MED FILL — ONETOUCH VERIO TEST STRIP: 30 days supply | Qty: 100 | Fill #1

## 2016-10-29 ENCOUNTER — Encounter: Payer: Self-pay | Admitting: Endocrinology

## 2016-10-29 ENCOUNTER — Ambulatory Visit (INDEPENDENT_AMBULATORY_CARE_PROVIDER_SITE_OTHER): Payer: BC Managed Care – PPO | Admitting: Endocrinology

## 2016-10-29 VITALS — BP 148/82 | HR 98 | Ht 77.0 in | Wt 347.0 lb

## 2016-10-29 DIAGNOSIS — E669 Obesity, unspecified: Secondary | ICD-10-CM

## 2016-10-29 DIAGNOSIS — E1169 Type 2 diabetes mellitus with other specified complication: Secondary | ICD-10-CM | POA: Diagnosis not present

## 2016-10-29 LAB — POCT GLYCOSYLATED HEMOGLOBIN (HGB A1C): Hemoglobin A1C: 7

## 2016-10-29 NOTE — Progress Notes (Signed)
Subjective:    Patient ID: Stephen Hall, male    DOB: 09-Dec-1980, 36 y.o.   MRN: [id number]  HPI Pt returns for f/u of diabetes mellitus: DM type: Insulin-requiring type 2 Dx'ed: 6967 Complications: none Therapy: insulin since 2017, and 2 oral meds DKA: never Severe hypoglycemia: never Pancreatitis: never Other: he declines weight loss surgery.   Interval history: He still takes 50 units qd.  no cbg record, but states cbg's are in the low-100's.  pt states he feels well in general.   Past Medical History:  Diagnosis Date  . Asthma   . Diabetes mellitus without complication ([place])   . Environmental allergies     No past surgical history on file.  Social History   Social History  . Marital status: Married    Spouse name: N/A  . Number of children: N/A  . Years of education: N/A   Occupational History  . Not on file.   Social History Main Topics  . Smoking status: Never Smoker  . Smokeless tobacco: Never Used  . Alcohol use No  . Drug use: No  . Sexual activity: Yes   Other Topics Concern  . Not on file   Social History Narrative   Caffienated drinks-yes   Seat belt use often-yes   Regular Exercise-no   Smoke alarm in the home-yes   Firearms/guns in the home-no   History of physical abuse-no                Current Outpatient Prescriptions on File Prior to Visit  Medication Sig Dispense Refill  . albuterol (PROAIR HFA) 108 (90 Base) MCG/ACT inhaler Inhale 2 puffs into the lungs every 6 (six) hours as needed for wheezing or shortness of breath. 1 Inhaler 1  . Blood Glucose Monitoring Suppl (ONETOUCH VERIO FLEX SYSTEM) w/Device KIT 1 Act by Does not apply route 3 (three) times daily. 2 kit 0  . Dapagliflozin-Metformin HCl ER (XIGDUO XR) 10-998 MG TB24 Take 2 tablets by mouth daily. 180 tablet 1  . glucose blood (ONETOUCH VERIO) test strip Use TID 100 each 12  . Insulin Degludec (TRESIBA FLEXTOUCH) 200 UNIT/ML SOPN Inject 50 Units into the skin daily. 3 mL  11  . Lancets MISC Use to check blood sugar twice daily. 100 each 2   No current facility-administered medications on file prior to visit.     No Known Allergies  Family History  Problem Relation Age of Onset  . Alcohol abuse Other   . Arthritis Other   . Hypertension Other   . Diabetes Other   . Obesity Mother   . Hypertension Mother   . Obesity Father   . Hypertension Father   . Cancer Neg Hx   . Heart disease Neg Hx   . Stroke Neg Hx   . Kidney disease Neg Hx     BP (!) 148/82   Pulse 98   Ht '6\' 5"'  (1.956 m)   Wt (!) 347 lb (157.4 kg)   SpO2 97%   BMI 41.15 kg/m   Review of Systems He denies hypoglycemia.      Objective:   Physical Exam VITAL SIGNS:  See vs page GENERAL: no distress Pulses: dorsalis pedis intact bilat.   MSK: no deformity of the feet.   CV: trace bilat leg edema.   Skin:  no ulcer on the feet.  normal color and temp on the feet.  Neuro: sensation is intact to touch on the feet.    Lab  Results  Component Value Date   TSH 2.49 07/23/2016   a1c=7.0%     Assessment & Plan:  Insulin-requiring type 2 DM: this is the best control this pt should aim for, given this insulin regimen, which does match insulin to his changing needs throughout the day.  HTN: he needs f/u for this.   Patient Instructions  Please have your blood pressure checked soon, at Dr Ronnald Ramp' office. Please continue tresiba, 50 units daily, and the diabetes pill.  check your blood sugar twice a day.  vary the time of day when you check, between before the 3 meals, and at bedtime.  also check if you have symptoms of your blood sugar being too high or too low.  please keep a record of the readings and bring it to your next appointment here (or you can bring the meter itself).  You can write it on any piece of paper.  please call us sooner if your blood sugar goes below 70, or if you have a lot of readings over 200.  Please come back for a follow-up appointment in 4 months

## 2016-10-29 NOTE — Patient Instructions (Addendum)
Please have your blood pressure checked soon, at Dr Yetta Barre' office. Please continue tresiba, 50 units daily, and the diabetes pill.  check your blood sugar twice a day.  vary the time of day when you check, between before the 3 meals, and at bedtime.  also check if you have symptoms of your blood sugar being too high or too low.  please keep a record of the readings and bring it to your next appointment here (or you can bring the meter itself).  You can write it on any piece of paper.  please call us sooner if your blood sugar goes below 70, or if you have a lot of readings over 200.  Please come back for a follow-up appointment in 4 months

## 2016-11-12 ENCOUNTER — Other Ambulatory Visit: Payer: Self-pay | Admitting: Internal Medicine

## 2016-11-12 DIAGNOSIS — E669 Obesity, unspecified: Principal | ICD-10-CM

## 2016-11-12 DIAGNOSIS — E1169 Type 2 diabetes mellitus with other specified complication: Secondary | ICD-10-CM

## 2016-11-12 MED FILL — TRESIBA FLEXTOUCH 200 UNITS: 200 | 30 days supply | Qty: 9 | Fill #2

## 2016-11-14 LAB — HM DIABETES EYE EXAM

## 2016-11-19 ENCOUNTER — Telehealth: Payer: Self-pay | Admitting: Internal Medicine

## 2016-11-19 NOTE — Telephone Encounter (Signed)
Patient Name: Stephen MorinJUSTIN Torosian DOB: 03-Apr-1981 Initial Comment Caller has had a rapid heart beat for a few months and high blood pressure. States he also needs a cpap machine to sleep. Nurse Assessment Nurse: Charna Elizabethrumbull, RN, Cathy Date/Time Lamount Cohen(Eastern Time): 11/19/2016 2:28:04 PM Confirm and document reason for call. If symptomatic, describe symptoms. ---Jill AlexandersJustin states he has had a fast heart rate for the past 5 months and shortness of breath again today. He was told he had high blood pressure in the past, but has been unable to check his blood pressure. No fever. Alert and responsive. He states he would like a CPAP machine. Does the patient have any new or worsening symptoms? ---Yes Will a triage be completed? ---Yes Related visit to physician within the last 2 weeks? ---No Does the PT have any chronic conditions? (i.e. diabetes, asthma, etc.) ---Yes List chronic conditions. ---Diabetes, Sleep Apnea Is this a behavioral health or substance abuse call? ---No Guidelines Guideline Title Affirmed Question Affirmed Notes Breathing Difficulty Extra heart beats OR irregular heart beating (i.e., "palpitations") Final Disposition User Go to ED Now Charna Elizabethrumbull, RN, [employer] BrothersCathy Comments Raney declined the Go to ER disposition and states he just wants to see his MD. He states he would like to request a CPAP machine. Referrals GO TO FACILITY REFUSED Disagree/Comply: Disagree Disagree/Comply Reason: Disagree with instructions

## 2016-11-20 ENCOUNTER — Encounter: Payer: Self-pay | Admitting: Internal Medicine

## 2016-11-20 ENCOUNTER — Ambulatory Visit (INDEPENDENT_AMBULATORY_CARE_PROVIDER_SITE_OTHER): Payer: BC Managed Care – PPO | Admitting: Internal Medicine

## 2016-11-20 VITALS — BP 138/100 | HR 90 | Temp 98.3°F | Resp 16 | Ht 77.0 in | Wt 342.2 lb

## 2016-11-20 DIAGNOSIS — E669 Obesity, unspecified: Secondary | ICD-10-CM | POA: Diagnosis not present

## 2016-11-20 DIAGNOSIS — E1169 Type 2 diabetes mellitus with other specified complication: Secondary | ICD-10-CM | POA: Diagnosis not present

## 2016-11-20 DIAGNOSIS — G4733 Obstructive sleep apnea (adult) (pediatric): Secondary | ICD-10-CM | POA: Diagnosis not present

## 2016-11-20 DIAGNOSIS — I10 Essential (primary) hypertension: Secondary | ICD-10-CM | POA: Diagnosis not present

## 2016-11-20 MED ORDER — AZILSARTAN-CHLORTHALIDONE 40-25 MG PO TABS: 1.0000 | ORAL_TABLET | Freq: Every day | ORAL | 0 refills | Status: DC

## 2016-11-20 NOTE — Progress Notes (Signed)
Subjective:  Patient ID: Stephen Hall, male    DOB: [birth year]  Age: 36 y.o. MRN: [id number]  CC: Hypertension   HPI Obaloluwa Delatte presents for a BP check - He complains of worsening symptoms suspicious for sleep apnea (choking at night, excessive daytime sleepiness, and severe fatigue during the day). He has also noticed that his blood pressure has not been well controlled. In addition, he is having trouble losing weight. He occasionally has edema in his lower extremities but that is not a persistent problem. He denies any recent episodes of headache/blurred vision/chest pain/shortness of breath/palpitations.  Outpatient Medications Prior to Visit  Medication Sig Dispense Refill  . albuterol (PROAIR HFA) 108 (90 Base) MCG/ACT inhaler Inhale 2 puffs into the lungs every 6 (six) hours as needed for wheezing or shortness of breath. 1 Inhaler 1  . Blood Glucose Monitoring Suppl (ONETOUCH VERIO FLEX SYSTEM) w/Device KIT 1 Act by Does not apply route 3 (three) times daily. 2 kit 0  . Dapagliflozin-Metformin HCl ER (XIGDUO XR) 10-998 MG TB24 Take 2 tablets by mouth daily. 180 tablet 1  . glucose blood (ONETOUCH VERIO) test strip Use TID 100 each 12  . Insulin Degludec (TRESIBA FLEXTOUCH) 200 UNIT/ML SOPN Inject 50 Units into the skin daily. 3 mL 11  . Lancets MISC Use to check blood sugar twice daily. 100 each 2   No facility-administered medications prior to visit.     ROS Review of Systems  Constitutional: Positive for fatigue. Negative for activity change, appetite change, diaphoresis and unexpected weight change.  HENT: Negative.   Eyes: Negative for visual disturbance.  Respiratory: Positive for apnea. Negative for cough, chest tightness, shortness of breath and wheezing.   Cardiovascular: Negative for chest pain, palpitations and leg swelling.  Gastrointestinal: Negative for abdominal pain, constipation, diarrhea, nausea and vomiting.  Endocrine: Negative.   Genitourinary:  Negative.  Negative for decreased urine volume and difficulty urinating.  Musculoskeletal: Negative.  Negative for back pain and neck pain.  Skin: Negative.   Allergic/Immunologic: Negative.   Neurological: Negative.  Negative for dizziness, weakness, numbness and headaches.  Hematological: Negative for adenopathy. Does not bruise/bleed easily.  Psychiatric/Behavioral: Negative.     Objective:  BP (!) 138/100 (BP Location: Right Arm, Patient Position: Sitting, Cuff Size: Large)   Pulse 90   Temp 98.3 F (36.8 C) (Oral)   Resp 16   Ht 6' 5" (1.956 m)   Wt (!) 342 lb 4 oz (155.2 kg)   SpO2 97%   BMI 40.58 kg/m   BP Readings from Last 3 Encounters:  11/20/16 (!) 138/100  10/29/16 (!) 148/82  09/26/16 130/90    Wt Readings from Last 3 Encounters:  11/20/16 (!) 342 lb 4 oz (155.2 kg)  10/29/16 (!) 347 lb (157.4 kg)  09/26/16 (!) 343 lb 4.8 oz (155.7 kg)    Physical Exam  Constitutional: He is oriented to person, place, and time. No distress.  HENT:  Mouth/Throat: Oropharynx is clear and moist. No oropharyngeal exudate.  Eyes: Conjunctivae are normal. Right eye exhibits no discharge. Left eye exhibits no discharge. No scleral icterus.  Neck: Normal range of motion. Neck supple. No JVD present. No tracheal deviation present. No thyromegaly present.  Cardiovascular: Normal rate, regular rhythm, normal heart sounds and intact distal pulses.  Exam reveals no gallop and no friction rub.   No murmur heard. Pulmonary/Chest: Effort normal and breath sounds normal. No stridor. No respiratory distress. He has no wheezes. He has no rales. He exhibits  no tenderness.  Abdominal: Soft. Bowel sounds are normal. He exhibits no distension and no mass. There is no tenderness. There is no rebound and no guarding.  Musculoskeletal: Normal range of motion. He exhibits no edema, tenderness or deformity.  Lymphadenopathy:    He has no cervical adenopathy.  Neurological: He is oriented to person,  place, and time.  Skin: Skin is warm and dry. No rash noted. He is not diaphoretic. No erythema. No pallor.  Vitals reviewed.   Lab Results  Component Value Date   WBC 9.7 07/23/2016   HGB 14.1 07/23/2016   HCT 43.2 07/23/2016   PLT 256.0 07/23/2016   GLUCOSE 92 07/23/2016   CHOL 199 07/23/2016   TRIG 109.0 07/23/2016   HDL 35.30 (L) 07/23/2016   LDLDIRECT 158.1 02/07/2012   LDLCALC 142 (H) 07/23/2016   ALT 18 07/23/2016   AST 12 07/23/2016   NA 136 07/23/2016   K 4.1 07/23/2016   CL 104 07/23/2016   CREATININE 0.87 07/23/2016   BUN 13 07/23/2016   CO2 25 07/23/2016   TSH 2.49 07/23/2016   HGBA1C 7.0 10/29/2016   MICROALBUR 1.6 07/23/2016    Dg Chest 2 View  Result Date: 06/20/2016 CLINICAL DATA:  Cough, chest congestion, and weakness for the past 4 days. History of asthma, diabetes, nonsmoker. EXAM: CHEST  2 VIEW COMPARISON:  None in PACs FINDINGS: The lungs are reasonably well inflated. There is linear increased density in the lingula. There is no alveolar infiltrate or pleural effusion. The heart and pulmonary vascularity are normal. The mediastinum is normal in width. The bony thorax is unremarkable. IMPRESSION: Mildly increased lung markings in the lingula consistent with subsegmental atelectasis or early interstitial pneumonia. Followup PA and lateral chest X-ray is recommended in 3-4 weeks following trial of antibiotic therapy to ensure resolution and exclude underlying malignancy. Electronically Signed   By: David  Martinique M.D.   On: 06/20/2016 15:22    Assessment & Plan:   Charan was seen today for hypertension.  Diagnoses and all orders for this visit:  OSA (obstructive sleep apnea)- he tells me that his last sleep study was about 4 years ago and he was told that his sleep apnea was mild and he didn't need CPAP. I've asked him to consider having another sleep study performed to see if his sleep apnea has worsened and whether or not he now requires CPAP therapy. -      Ambulatory referral to Sleep Studies  Diabetes mellitus type 2 in obese [facility])- his recent A1c was 7.0%, his blood sugars are adequately well controlled. Will add an ARB for renal protection. -     Azilsartan-Chlorthalidone 40-25 MG TABS; Take 1 tablet by mouth daily.  Essential hypertension- his recent labs show no evidence of end organ damage or secondary causes for hypertension. I have encouraged him to have his sleep apnea diagnosed and treated. For now, will try to control his blood pressure with a combination of an ARB and thiazide diuretic. -     Azilsartan-Chlorthalidone 40-25 MG TABS; Take 1 tablet by mouth daily.   I am having Mr. Siefert start on Azilsartan-Chlorthalidone. I am also having him maintain his Insulin Degludec, Dapagliflozin-Metformin HCl ER, glucose blood, ONETOUCH VERIO FLEX SYSTEM, albuterol, and Lancets.  Meds ordered this encounter  Medications  . Azilsartan-Chlorthalidone 40-25 MG TABS    Sig: Take 1 tablet by mouth daily.    Dispense:  35 tablet    Refill:  0     Follow-up:  Return in about 4 weeks (around 12/18/2016).  Scarlette Calico, MD

## 2016-11-20 NOTE — Patient Instructions (Signed)

## 2016-11-24 ENCOUNTER — Encounter: Payer: Self-pay | Admitting: Family Medicine

## 2016-11-24 ENCOUNTER — Ambulatory Visit (INDEPENDENT_AMBULATORY_CARE_PROVIDER_SITE_OTHER): Payer: BC Managed Care – PPO | Admitting: Family Medicine

## 2016-11-24 VITALS — BP 124/90 | HR 107 | Temp 98.3°F | Resp 18 | Ht 77.0 in | Wt 331.0 lb

## 2016-11-24 DIAGNOSIS — E669 Obesity, unspecified: Secondary | ICD-10-CM

## 2016-11-24 DIAGNOSIS — R000 Tachycardia, unspecified: Secondary | ICD-10-CM | POA: Diagnosis not present

## 2016-11-24 DIAGNOSIS — I10 Essential (primary) hypertension: Secondary | ICD-10-CM

## 2016-11-24 DIAGNOSIS — E1169 Type 2 diabetes mellitus with other specified complication: Secondary | ICD-10-CM

## 2016-11-24 MED ORDER — AZILSARTAN-CHLORTHALIDONE 40-25 MG PO TABS: 0.5000 | ORAL_TABLET | Freq: Every day | ORAL | 0 refills | Status: DC

## 2016-11-24 NOTE — Progress Notes (Signed)
Pre visit review using our clinic review tool, if applicable. No additional management support is needed unless otherwise documented below in the visit note. 

## 2016-11-24 NOTE — Patient Instructions (Signed)
  [name] I have seen you today for an acute visit.  A few things to remember from today's visit:   Sinus tachycardia - Plan: EKG 12-Lead  Diabetes mellitus type 2 in obese (HCC) - Plan: Azilsartan-Chlorthalidone 40-25 MG TABS  Essential hypertension - Plan: Azilsartan-Chlorthalidone 40-25 MG TABS     EKG does not show acute heart attack but if you have chest pain, difficulty breathing, worsening palpitations, or dizziness.  I do not have another EKG for comparison.   Dehydration can cause symptoms,so increase fluid intake. Blood pressure med decreased to 1/2 tab, monitor blood pressure.  One of diabetes med can also cause dehydration.  Follow with pcp in 3-4 days to discussed meds and symptoms.    In general please monitor for signs of worsening symptoms and seek immediate medical attention if any concerning.   Please be sure you have an appointment already scheduled with your PCP before you leave today.

## 2016-11-24 NOTE — Progress Notes (Signed)
HPI:   ACUTE VISIT:  Chief Complaint  Patient presents with  . Tachycardia    [name] is a 36 y.o. male, who is here today with his wife complaining of elevated HR.  He states that he has noted problems for sometime" now, seems more frequent for 4 months and worse for the past 2-3 weeks.  Initially it was intermittently but now he feels his heart "beating up" constantly, yesterday after mowing grass it was 140/min. Usually 108/min. It happens at rest.  Hx of HTN and DM II. He is on Diuretic and a SGLT2 inh, Xigduo and Azilsartan-Chlothalidone 40-25 mg daily. Also on Tresiba. Denies hypoglycemia. He has Hx of asthma, has not used Albuterol in a while. Denies taking OTC cold meds.  He feels like he is drinking enough fluids: "a lot of water and juice."  He is not checking BP at home.  Lab Results  Component Value Date   CREATININE 0.87 07/23/2016   BUN 13 07/23/2016   NA 136 07/23/2016   K 4.1 07/23/2016   CL 104 07/23/2016   CO2 25 07/23/2016    Denies severe/frequent headache, visual changes, chest pain,claudication, focal weakness, or edema. He denies dyspnea but states that "it takes a big effort to breath" if he is talking for 5 min or more.  Hx of OSA, he has not gotten his CPAP machine yet, fatigue stable.   Palpitations   This is a recurrent problem. The current episode started more than 1 month ago. The problem occurs constantly. The problem has been gradually worsening. The symptoms are aggravated by unknown. Associated symptoms include anxiety, diaphoresis and malaise/fatigue. Pertinent negatives include no chest pain, coughing, dizziness, fever, irregular heartbeat, nausea, near-syncope, numbness, shortness of breath, vomiting or weakness. He has tried nothing for the symptoms. The treatment provided no relief. Risk factors include obesity, sedentary lifestyle and being male. His past medical history is significant for anxiety.    + Sweating,  states that he "always" sweat a lot but seems to be worse.   Review of Systems  Constitutional: Positive for diaphoresis, fatigue and malaise/fatigue. Negative for activity change, appetite change, fever and unexpected weight change.  HENT: Negative for nosebleeds, sore throat and trouble swallowing.   Eyes: Negative for redness and visual disturbance.  Respiratory: Negative for cough, shortness of breath and wheezing.   Cardiovascular: Positive for palpitations. Negative for chest pain, leg swelling and near-syncope.  Gastrointestinal: Negative for abdominal pain, nausea and vomiting.  Endocrine: Negative for polydipsia, polyphagia and polyuria.  Genitourinary: Negative for decreased urine volume and hematuria.  Neurological: Negative for dizziness, syncope, weakness, numbness and headaches.  Psychiatric/Behavioral: Negative for confusion. The patient is nervous/anxious.       Current Outpatient Prescriptions on File Prior to Visit  Medication Sig Dispense Refill  . Blood Glucose Monitoring Suppl (ONETOUCH VERIO FLEX SYSTEM) w/Device KIT 1 Act by Does not apply route 3 (three) times daily. 2 kit 0  . glucose blood (ONETOUCH VERIO) test strip Use TID 100 each 12  . Insulin Degludec (TRESIBA FLEXTOUCH) 200 UNIT/ML SOPN Inject 50 Units into the skin daily. 3 mL 11  . Lancets MISC Use to check blood sugar twice daily. 100 each 2  . albuterol (PROAIR HFA) 108 (90 Base) MCG/ACT inhaler Inhale 2 puffs into the lungs every 6 (six) hours as needed for wheezing or shortness of breath. (Patient not taking: Reported on 11/24/2016) 1 Inhaler 1  . Dapagliflozin-Metformin HCl ER (XIGDUO XR) 10-998  MG TB24 Take 2 tablets by mouth daily. 180 tablet 1   No current facility-administered medications on file prior to visit.      Past Medical History:  Diagnosis Date  . Asthma   . Diabetes mellitus without complication ([place])   . Environmental allergies    No Known Allergies  Social History    Social History  . Marital status: Married    Spouse name: N/A  . Number of children: N/A  . Years of education: N/A   Social History Main Topics  . Smoking status: Never Smoker  . Smokeless tobacco: Never Used  . Alcohol use No  . Drug use: No  . Sexual activity: Yes   Other Topics Concern  . None   Social History Narrative   Caffienated drinks-yes   Seat belt use often-yes   Regular Exercise-no   Smoke alarm in the home-yes   Firearms/guns in the home-no   History of physical abuse-no                Vitals:   11/24/16 1115  BP: 124/90  Pulse: (!) 107  Resp: 18  Temp: 98.3 F (36.8 C)  O2 sat at RA 98% Body mass index is 39.25 kg/m.   Physical Exam  Nursing note and vitals reviewed. Constitutional: He is oriented to person, place, and time. He appears well-developed. No distress.  HENT:  Head: Atraumatic.  Mouth/Throat: Oropharynx is clear and moist. Mucous membranes are dry.  Eyes: Conjunctivae and EOM are normal. Pupils are equal, round, and reactive to light.  Neck: No JVD present. No tracheal deviation present. No thyroid mass and no thyromegaly present.  Cardiovascular: Regular rhythm.  Tachycardia present.   No murmur heard. Pulses:      Dorsalis pedis pulses are 2+ on the right side, and 2+ on the left side.  Respiratory: Effort normal and breath sounds normal. No respiratory distress.  GI: Soft. He exhibits no mass. There is no hepatomegaly. There is no tenderness.  Musculoskeletal: He exhibits no edema.  Lymphadenopathy:    He has no cervical adenopathy.  Neurological: He is alert and oriented to person, place, and time. He has normal strength. Coordination and gait normal.  Skin: Skin is warm. No erythema.  Psychiatric: His mood appears anxious.  Appropriately groomed, good eye contact.    ASSESSMENT AND PLAN:   Jari was seen today for tachycardia.  Diagnoses and all orders for this visit:  Sinus tachycardia  Possible etiologies  discussed: dehydration, anxiety, meds side effects, and cardiac  among some. Hemodynamically stable.  EKG today: Mild sinus tach with minimal rhythm variation (sinus arrhythmia),normal axis and intervals, inverted T waves in several leads, repolarization variant. There is not another EKG for comparison. Options discussed, I do not have stat lab service today, so he could go to ER across the street ([place]) or he follows with PCP in 3 days, he prefers the later one.  Increase fluid intake and decrease dose of Azilsartan-Chlorthalidone to 20-12.5 mg. Clearly instructed about warning signs.  -     EKG 12-Lead  Diabetes mellitus type 2 in obese [facility])  He needs to be more complicate with diet, avoid juices. No changes in medications, follow with PCP.  -     Azilsartan-Chlorthalidone 40-25 MG TABS; Take 0.5 tablets by mouth daily.  Essential hypertension  DBP mildly elevated. Becasue ? Dehydration I recommended decreasing antihypertensive medication, he may need another one adde depending of BP readings. Monitor BP at home. Low  salt diet. Instructed about warning signs. F/U with PCP in 3 days.  -     Azilsartan-Chlorthalidone 40-25 MG TABS; Take 0.5 tablets by mouth daily.     Return in about 3 days (around 11/27/2016) for tachycardia.     -Mr.Gohan Collister was advised to seek immediate medical attention is symptoms suddenly get worse. He and his wife voice understanding.      Betty G. Martinique, MD  [facility]. [place] office.

## 2016-11-27 ENCOUNTER — Encounter: Payer: Self-pay | Admitting: Internal Medicine

## 2016-11-27 ENCOUNTER — Telehealth: Payer: Self-pay | Admitting: Internal Medicine

## 2016-11-27 ENCOUNTER — Ambulatory Visit (INDEPENDENT_AMBULATORY_CARE_PROVIDER_SITE_OTHER): Payer: BC Managed Care – PPO | Admitting: Internal Medicine

## 2016-11-27 VITALS — BP 120/60 | HR 84 | Temp 98.4°F | Resp 16 | Ht 77.0 in | Wt 335.0 lb

## 2016-11-27 DIAGNOSIS — R002 Palpitations: Secondary | ICD-10-CM

## 2016-11-27 DIAGNOSIS — R0609 Other forms of dyspnea: Secondary | ICD-10-CM

## 2016-11-27 NOTE — Progress Notes (Signed)
Subjective:  Patient ID: Stephen Hall, male    DOB: 19-Apr-1981  Age: 36 y.o. MRN: [id number]  CC: Tachycardia   HPI Stephen Hall presents for Concerns about an elevated heart rate. He feels like his heart rate is mildly elevated at rest but then when he starts to exert himself the heart rate goes above 100. He also has mild DOE. He notices his symptoms mostly over the last few weeks when he has been cutting the grass. He doesn't feel like his heart beats irregularly and he denies dizziness, lightheadedness, chest pain, near syncope, fatigue, edema.  Outpatient Medications Prior to Visit  Medication Sig Dispense Refill  . albuterol (PROAIR HFA) 108 (90 Base) MCG/ACT inhaler Inhale 2 puffs into the lungs every 6 (six) hours as needed for wheezing or shortness of breath. 1 Inhaler 1  . Azilsartan-Chlorthalidone 40-25 MG TABS Take 0.5 tablets by mouth daily. 35 tablet 0  . Blood Glucose Monitoring Suppl (ONETOUCH VERIO FLEX SYSTEM) w/Device KIT 1 Act by Does not apply route 3 (three) times daily. 2 kit 0  . Dapagliflozin-Metformin HCl ER (XIGDUO XR) 10-998 MG TB24 Take 2 tablets by mouth daily. 180 tablet 1  . glucose blood (ONETOUCH VERIO) test strip Use TID 100 each 12  . Insulin Degludec (TRESIBA FLEXTOUCH) 200 UNIT/ML SOPN Inject 50 Units into the skin daily. 3 mL 11  . Lancets MISC Use to check blood sugar twice daily. 100 each 2   No facility-administered medications prior to visit.     ROS Review of Systems  Constitutional: Negative for activity change, diaphoresis and fatigue.  HENT: Negative.   Eyes: Negative.   Respiratory: Positive for apnea and shortness of breath. Negative for cough, chest tightness and wheezing.   Cardiovascular: Positive for palpitations. Negative for chest pain and leg swelling.  Gastrointestinal: Negative for abdominal pain, nausea and vomiting.  Endocrine: Negative.   Genitourinary: Negative.  Negative for difficulty urinating.  Musculoskeletal:  Negative.  Negative for back pain and neck pain.  Skin: Negative.   Neurological: Negative.  Negative for dizziness, tremors, syncope, weakness and light-headedness.  Hematological: Negative for adenopathy. Does not bruise/bleed easily.  Psychiatric/Behavioral: Negative.     Objective:  BP 120/60 (BP Location: Left Arm, Patient Position: Sitting, Cuff Size: Normal)   Pulse 84   Temp 98.4 F (36.9 C) (Oral)   Resp 16   Ht _0  (1.956 m)   Wt (!) 335 lb (152 kg)   SpO2 99%   BMI 39.73 kg/m   BP Readings from Last 3 Encounters:  11/27/16 120/60  11/24/16 124/90  11/20/16 (!) 138/100    Wt Readings from Last 3 Encounters:  11/27/16 (!) 335 lb (152 kg)  11/24/16 (!) 331 lb (150.1 kg)  11/20/16 (!) 342 lb 4 oz (155.2 kg)    Physical Exam  Constitutional: He is oriented to person, place, and time. No distress.  HENT:  Mouth/Throat: Oropharynx is clear and moist. No oropharyngeal exudate.  Eyes: Conjunctivae are normal. Right eye exhibits no discharge. Left eye exhibits no discharge. No scleral icterus.  Neck: Normal range of motion. Neck supple. No JVD present. No thyromegaly present.  Cardiovascular: Normal rate, regular rhythm, normal heart sounds and intact distal pulses.  Exam reveals no gallop and no friction rub.   No murmur heard. Pulmonary/Chest: Breath sounds normal. No respiratory distress. He has no wheezes. He has no rales. He exhibits no tenderness.  Abdominal: Soft. Bowel sounds are normal. He exhibits no distension and no  mass. There is no tenderness. There is no rebound and no guarding.  Musculoskeletal: Normal range of motion. He exhibits no edema, tenderness or deformity.  Neurological: He is alert and oriented to person, place, and time.  Skin: Skin is warm and dry. No rash noted. He is not diaphoretic. No erythema. No pallor.  Vitals reviewed.   Lab Results  Component Value Date   WBC 9.7 07/23/2016   HGB 14.1 07/23/2016   HCT 43.2 07/23/2016   PLT  256.0 07/23/2016   GLUCOSE 92 07/23/2016   CHOL 199 07/23/2016   TRIG 109.0 07/23/2016   HDL 35.30 (L) 07/23/2016   LDLDIRECT 158.1 02/07/2012   LDLCALC 142 (H) 07/23/2016   ALT 18 07/23/2016   AST 12 07/23/2016   NA 136 07/23/2016   K 4.1 07/23/2016   CL 104 07/23/2016   CREATININE 0.87 07/23/2016   BUN 13 07/23/2016   CO2 25 07/23/2016   TSH 2.49 07/23/2016   HGBA1C 7.0 10/29/2016   MICROALBUR 1.6 07/23/2016    Dg Chest 2 View  Result Date: 06/20/2016 CLINICAL DATA:  Cough, chest congestion, and weakness for the past 4 days. History of asthma, diabetes, nonsmoker. EXAM: CHEST  2 VIEW COMPARISON:  None in PACs FINDINGS: The lungs are reasonably well inflated. There is linear increased density in the lingula. There is no alveolar infiltrate or pleural effusion. The heart and pulmonary vascularity are normal. The mediastinum is normal in width. The bony thorax is unremarkable. IMPRESSION: Mildly increased lung markings in the lingula consistent with subsegmental atelectasis or early interstitial pneumonia. Followup PA and lateral chest X-ray is recommended in 3-4 weeks following trial of antibiotic therapy to ensure resolution and exclude underlying malignancy. Electronically Signed   By: David  Martinique M.D.   On: 06/20/2016 15:22    Assessment & Plan:   Esa was seen today for tachycardia.  Diagnoses and all orders for this visit:  DOE (dyspnea on exertion)- he reports mild tachycardia with exertion, his exercise tolerance test is within normal limits. I think that his symptoms are related to sleep apnea, obesity, and poor conditioning. I have asked him to continue to pursue cardiovascular exercise to mitigate the symptoms. -     EXERCISE TOLERANCE TEST; Future  Palpitations- as above -     EXERCISE TOLERANCE TEST; Future   I am having Mr. Mcconville maintain his Insulin Degludec, Dapagliflozin-Metformin HCl ER, glucose blood, ONETOUCH VERIO FLEX SYSTEM, albuterol, Lancets, and  Azilsartan-Chlorthalidone.  No orders of the defined types were placed in this encounter.    Follow-up: Return in about 4 weeks (around 12/25/2016).  Scarlette Calico, MD

## 2016-11-27 NOTE — Patient Instructions (Signed)
Palpitations A palpitation is the feeling that your heartbeat is irregular or is faster than normal. It may feel like your heart is fluttering or skipping a beat. Palpitations are usually not a serious problem. They may be caused by many things, including smoking, caffeine, alcohol, stress, and certain medicines. Although most causes of palpitations are not serious, palpitations can be a sign of a serious medical problem. In some cases, you may need further medical evaluation. Follow these instructions at home: Pay attention to any changes in your symptoms. Take these actions to help with your condition:  Avoid the following: ? Caffeinated coffee, tea, soft drinks, diet pills, and energy drinks. ? Chocolate. ? Alcohol.  Do not use any tobacco products, such as cigarettes, chewing tobacco, and e-cigarettes. If you need help quitting, ask your health care provider.  Try to reduce your stress and anxiety. Things that can help you relax include: ? Yoga. ? Meditation. ? Physical activity, such as swimming, jogging, or walking. ? Biofeedback. This is a method that helps you learn to use your mind to control things in your body, such as your heartbeats.  Get plenty of rest and sleep.  Take over-the-counter and prescription medicines only as told by your health care provider.  Keep all follow-up visits as told by your health care provider. This is important.  Contact a health care provider if:  You continue to have a fast or irregular heartbeat after 24 hours.  Your palpitations occur more often. Get help right away if:  You have chest pain or shortness of breath.  You have a severe headache.  You feel dizzy or you faint. This information is not intended to replace advice given to you by your health care provider. Make sure you discuss any questions you have with your health care provider. Document Released: 06/15/2000 Document Revised: 11/21/2015 Document Reviewed: 03/03/2015 Elsevier  Interactive Patient Education  2017 Elsevier Inc.  

## 2016-11-29 ENCOUNTER — Telehealth: Payer: Self-pay | Admitting: Internal Medicine

## 2016-11-29 ENCOUNTER — Telehealth (HOSPITAL_COMMUNITY): Payer: Self-pay

## 2016-11-29 NOTE — Telephone Encounter (Signed)
Encounter complete. 

## 2016-11-29 NOTE — Telephone Encounter (Signed)
[facility] called regarding pt, they would like to know if he needs a cardiologists appt before his stress test tomorrow. The Pt was confused about they called to clarify what he is needing. If you do want a cardiologists appt before his stress test they will resch tomorrow. They would like a call back.

## 2016-11-29 NOTE — Telephone Encounter (Signed)
[facility]. Pt does not need to see cardiologist prior to stress test tomorrow.

## 2016-11-30 ENCOUNTER — Ambulatory Visit (HOSPITAL_COMMUNITY)
Admission: RE | Admit: 2016-11-30 | Discharge: 2016-11-30 | Disposition: A | Discharge status: Home / Self Care | Payer: BC Managed Care – PPO | Source: Ambulatory Visit | Attending: Cardiology | Admitting: Cardiology

## 2016-11-30 ENCOUNTER — Encounter (HOSPITAL_COMMUNITY): Payer: Self-pay | Admitting: *Deleted

## 2016-11-30 DIAGNOSIS — R0609 Other forms of dyspnea: Secondary | ICD-10-CM

## 2016-11-30 DIAGNOSIS — R002 Palpitations: Secondary | ICD-10-CM

## 2016-11-30 LAB — EXERCISE TOLERANCE TEST
CHL CUP STRESS STAGE 1 DBP: 85 mmHg
CHL CUP STRESS STAGE 1 GRADE: 0 %
CHL CUP STRESS STAGE 1 HR: 120 {beats}/min
CHL CUP STRESS STAGE 1 SPEED: 0 mph
CHL CUP STRESS STAGE 2 HR: 114 {beats}/min
CHL CUP STRESS STAGE 4 SBP: 156 mmHg
CHL CUP STRESS STAGE 4 SPEED: 1.7 mph
CHL CUP STRESS STAGE 5 GRADE: 12 %
CHL CUP STRESS STAGE 5 HR: 166 {beats}/min
CHL CUP STRESS STAGE 6 GRADE: 14 %
CHL CUP STRESS STAGE 6 HR: 187 {beats}/min
CHL CUP STRESS STAGE 6 SPEED: 3.4 mph
CHL CUP STRESS STAGE 7 DBP: 82 mmHg
CHL CUP STRESS STAGE 7 SBP: 179 mmHg
CHL CUP STRESS STAGE 8 SPEED: 0 mph
CSEPPHR: 187 {beats}/min
Estimated workload: 9.5 METS
Exercise duration (min): 7 min
Exercise duration (sec): 39 s
MPHR: 186 {beats}/min
Percent HR: 100 %
Percent of predicted max HR: 100 %
RPE: 18
Rest HR: 99 {beats}/min
Stage 1 SBP: 125 mmHg
Stage 2 Grade: 0 %
Stage 2 Speed: 1 mph
Stage 3 Grade: 0 %
Stage 3 HR: 112 {beats}/min
Stage 3 Speed: 1 mph
Stage 4 DBP: 83 mmHg
Stage 4 Grade: 10 %
Stage 4 HR: 150 {beats}/min
Stage 5 DBP: 75 mmHg
Stage 5 SBP: 180 mmHg
Stage 5 Speed: 2.5 mph
Stage 7 Grade: 0 %
Stage 7 HR: 176 {beats}/min
Stage 7 Speed: 0 mph
Stage 8 DBP: 73 mmHg
Stage 8 Grade: 0 %
Stage 8 HR: 131 {beats}/min
Stage 8 SBP: 108 mmHg

## 2016-11-30 NOTE — Progress Notes (Unsigned)
Abnormal ETT was reviewed by Dr. Hochrein. Patient was given the okay to be discharged to go home. 

## 2016-12-01 ENCOUNTER — Encounter: Payer: Self-pay | Admitting: Internal Medicine

## 2016-12-31 ENCOUNTER — Encounter: Payer: Self-pay | Admitting: Internal Medicine

## 2016-12-31 ENCOUNTER — Ambulatory Visit (INDEPENDENT_AMBULATORY_CARE_PROVIDER_SITE_OTHER): Payer: BC Managed Care – PPO | Admitting: Internal Medicine

## 2016-12-31 DIAGNOSIS — I10 Essential (primary) hypertension: Secondary | ICD-10-CM | POA: Diagnosis not present

## 2016-12-31 DIAGNOSIS — R002 Palpitations: Secondary | ICD-10-CM | POA: Diagnosis not present

## 2016-12-31 NOTE — Patient Instructions (Signed)

## 2016-12-31 NOTE — Progress Notes (Signed)
Subjective:  Patient ID: Estle Huguley, male    DOB: 06/24/1981  Age: 36 y.o. MRN: [id number]  CC: Hypertension and Diabetes   HPI Grahm Etsitty presents for f/up - He continues to be concerned about his heart rate. He notices with activity and at rest that his heart rate goes up and he feels like it's pounding. The maximum heart rate he has detected over the last month has been 103. His ETT was normal 1 month ago. He also complains that he sweats easily. His wife complains that he worries too much. He does not want to be treated for anxiety. Tomorrow he is being evaluated for sleep apnea. He wants to be referred to consider undergoing bariatric surgery.  Outpatient Medications Prior to Visit  Medication Sig Dispense Refill  . albuterol (PROAIR HFA) 108 (90 Base) MCG/ACT inhaler Inhale 2 puffs into the lungs every 6 (six) hours as needed for wheezing or shortness of breath. 1 Inhaler 1  . Azilsartan-Chlorthalidone 40-25 MG TABS Take 0.5 tablets by mouth daily. 35 tablet 0  . Blood Glucose Monitoring Suppl (ONETOUCH VERIO FLEX SYSTEM) w/Device KIT 1 Act by Does not apply route 3 (three) times daily. 2 kit 0  . Dapagliflozin-Metformin HCl ER (XIGDUO XR) 10-998 MG TB24 Take 2 tablets by mouth daily. 180 tablet 1  . glucose blood (ONETOUCH VERIO) test strip Use TID 100 each 12  . Insulin Degludec (TRESIBA FLEXTOUCH) 200 UNIT/ML SOPN Inject 50 Units into the skin daily. 3 mL 11  . Lancets MISC Use to check blood sugar twice daily. 100 each 2   No facility-administered medications prior to visit.     ROS Review of Systems  Constitutional: Negative.  Negative for activity change, diaphoresis, fatigue and unexpected weight change.  HENT: Negative.   Eyes: Negative.  Negative for visual disturbance.  Respiratory: Positive for apnea. Negative for cough, chest tightness, shortness of breath and wheezing.   Cardiovascular: Positive for palpitations. Negative for chest pain and leg swelling.    Gastrointestinal: Negative for abdominal pain, constipation, diarrhea, nausea and vomiting.  Genitourinary: Negative.  Negative for difficulty urinating.  Musculoskeletal: Negative.   Skin: Negative.   Allergic/Immunologic: Negative.   Neurological: Negative.  Negative for dizziness, weakness and light-headedness.  Hematological: Negative for adenopathy. Does not bruise/bleed easily.  Psychiatric/Behavioral: Positive for sleep disturbance. Negative for agitation, confusion, decreased concentration, dysphoric mood, self-injury and suicidal ideas. The patient is nervous/anxious.     Objective:  BP 122/86 (BP Location: Left Arm, Patient Position: Sitting, Cuff Size: Large)   Pulse 74   Temp 98.4 F (36.9 C) (Oral)   Resp 16   Ht _0  (1.956 m)   Wt (!) 332 lb (150.6 kg)   SpO2 98%   BMI 39.37 kg/m   BP Readings from Last 3 Encounters:  12/31/16 122/86  11/27/16 120/60  11/24/16 124/90    Wt Readings from Last 3 Encounters:  12/31/16 (!) 332 lb (150.6 kg)  11/27/16 (!) 335 lb (152 kg)  11/24/16 (!) 331 lb (150.1 kg)    Physical Exam  Constitutional: He is oriented to person, place, and time. No distress.  HENT:  Mouth/Throat: Oropharynx is clear and moist. No oropharyngeal exudate.  Eyes: Conjunctivae are normal. Right eye exhibits no discharge. Left eye exhibits no discharge. No scleral icterus.  Neck: Normal range of motion. Neck supple. No JVD present. No thyromegaly present.  Cardiovascular: Normal rate, regular rhythm and intact distal pulses.  Exam reveals no gallop.   No  murmur heard. Pulmonary/Chest: Effort normal and breath sounds normal. No respiratory distress. He has no wheezes. He has no rales. He exhibits no tenderness.  Abdominal: Soft. Bowel sounds are normal. He exhibits no distension and no mass. There is no tenderness. There is no rebound and no guarding.  Musculoskeletal: Normal range of motion. He exhibits no edema, tenderness or deformity.   Lymphadenopathy:    He has no cervical adenopathy.  Neurological: He is alert and oriented to person, place, and time.  Skin: Skin is warm and dry. No rash noted. He is not diaphoretic. No erythema. No pallor.  Vitals reviewed.   Lab Results  Component Value Date   WBC 9.7 07/23/2016   HGB 14.1 07/23/2016   HCT 43.2 07/23/2016   PLT 256.0 07/23/2016   GLUCOSE 92 07/23/2016   CHOL 199 07/23/2016   TRIG 109.0 07/23/2016   HDL 35.30 (L) 07/23/2016   LDLDIRECT 158.1 02/07/2012   LDLCALC 142 (H) 07/23/2016   ALT 18 07/23/2016   AST 12 07/23/2016   NA 136 07/23/2016   K 4.1 07/23/2016   CL 104 07/23/2016   CREATININE 0.87 07/23/2016   BUN 13 07/23/2016   CO2 25 07/23/2016   TSH 2.49 07/23/2016   HGBA1C 7.0 10/29/2016   MICROALBUR 1.6 07/23/2016    No results found.  Assessment & Plan:   Marlos was seen today for hypertension and diabetes.  Diagnoses and all orders for this visit:  Morbid obesity ([place]) -     Ambulatory referral to General Surgery  Essential hypertension- His blood pressure is adequately well controlled  Palpitations- I think his palpitations are related to obesity, poor conditioning, and worry/anxiety. I offered him reassurance. it will be interesting to see if his screening for sleep apnea is positive. It is also reassuring that his recent ETT was normal. He will let me know if he develops any new symptoms related to this.   I am having Mr. Sperl maintain his Insulin Degludec, Dapagliflozin-Metformin HCl ER, glucose blood, ONETOUCH VERIO FLEX SYSTEM, albuterol, Lancets, and Azilsartan-Chlorthalidone.  No orders of the defined types were placed in this encounter.    Follow-up: Return in about 4 months (around 05/03/2017).  Scarlette Calico, MD

## 2017-01-01 ENCOUNTER — Encounter: Payer: Self-pay | Admitting: Neurology

## 2017-01-01 ENCOUNTER — Ambulatory Visit (INDEPENDENT_AMBULATORY_CARE_PROVIDER_SITE_OTHER): Payer: BC Managed Care – PPO | Admitting: Neurology

## 2017-01-01 VITALS — BP 133/93 | HR 84 | Ht 77.0 in | Wt 335.0 lb

## 2017-01-01 DIAGNOSIS — G4719 Other hypersomnia: Secondary | ICD-10-CM | POA: Diagnosis not present

## 2017-01-01 DIAGNOSIS — E669 Obesity, unspecified: Secondary | ICD-10-CM

## 2017-01-01 DIAGNOSIS — R0689 Other abnormalities of breathing: Secondary | ICD-10-CM | POA: Insufficient documentation

## 2017-01-01 DIAGNOSIS — R002 Palpitations: Secondary | ICD-10-CM | POA: Diagnosis not present

## 2017-01-01 DIAGNOSIS — R441 Visual hallucinations: Secondary | ICD-10-CM

## 2017-01-01 DIAGNOSIS — G4759 Other parasomnia: Secondary | ICD-10-CM

## 2017-01-01 NOTE — Progress Notes (Signed)
S[facility]INE [facility]   Provider:  Larey Seat, M D  Primary Care Physician:  Janith Lima, MD   Referring Provider: Janith Lima, MD    Chief Complaint  Patient presents with  . New Patient (Initial Visit)    referred d/t difficulty sleeping    HPI:  Stephen Hall is a 36 y.o. male , seen here as in a referral/ revisit  from Dr. Ronnald Ramp .  Dr Ronnald Ramp had seen this patient on 11-20-2016 and referred based on his note; HPI Stephen Hall presents for a BP check - He complains of worsening symptoms suspicious for sleep apnea (choking at night, excessive daytime sleepiness, and severe fatigue during the day). He has also noticed that his blood pressure has not been well controlled. In addition, he is having trouble losing weight. He occasionally has edema in his lower extremities but that is not a persistent problem. He denies any recent episodes of headache/blurred vision/chest pain/shortness of breath/palpitations.  Chief complaint according to patient : " I feel foggy brained and slow" .[name] reports that when adrenaline as high he is alert concentrated and able to make a presentation, able to make decisions etc. as soon as such a situation passes he feels an irresistible urge to sleep. He also falls asleep when commuting with a Social worker. He is sleepier when driving. He is worried.  Sleep habits are as follows: He aims for a time between 10 PM and midnight to go to sleep. His bedroom is conducive to sleep, described as cool, quiet and dark. He does like to have the TV on in the background for at least half an hour. He shares a bedroom with his wife. His wife is a deep, sound sleeper and has not witnessed any snoring or apnea of his. He has bought a firm mattress with an adjustable head of bed hoping that will help him sleep better. He sleeps on one pillow and has noted that when he tried two pillows his sleep position seems to promote choking sensation. He will usually wake up  about 4 hours after he went to sleep, it is unclear what wakes him, he sometimes goes to the bathroom but was not woken by the urge. He reports being awoken by sometimes vivid dreams. He often struggles to get up.  He wakes up with the help of his alarm clock at 5:15, but he rises at about 5;45. He does not feel refreshed at that time, not restored and he would crave more sleep. Total overnight sleep time is about 5 hours but his sound asleep seems to take place and the 90 minutes before he has to rise.   Sleep medical history and family sleep history: OSA testing in 2014, was negative- he presumed. He slept so well - and there was no apnea.  His father may have had apnea, mother is snsring.  He has purchased a mouth piece, more a dental guard. Does not affect his sleep.    Social history:  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 He has never used tobacco products, he does not drink alcohol, he does not drink coffee but iced tea and sodas. He drinks maximal 2 sodas or teas a day. Switched to water.  He only works on her shift work schedule on snow days  Review of Systems: Out of a complete 14 system review, the patient complains of only the following symptoms,  and all other reviewed systems are negative. Nocturnal palpitations and diaphoresis are mentioned as well as vivid dreams, waking out up from a dream that is very real, sleep related hallucinations and dream intrusions,  The patient has an albuterol inhaler for wheezing and shortness of breath, his blood pressure is treated on chlorthalidone, azilsartan, he is taking a diabetes medication a CombiPatch duct,xigduo. He is also on insulin.   Epworth score 16 , Fatigue severity score 46 , depression score 0/15    Social History   Social History  . Marital status: Married    Spouse name: N/A  . Number of children: N/A  . Years of education: N/A   Occupational  History  . Not on file.   Social History Main Topics  . Smoking status: Never Smoker  . Smokeless tobacco: Never Used  . Alcohol use No  . Drug use: No  . Sexual activity: Yes   Other Topics Concern  . Not on file   Social History Narrative   Caffienated drinks-yes   Seat belt use often-yes   Regular Exercise-no   Smoke alarm in the home-yes   Firearms/guns in the home-no   History of physical abuse-no                Family History  Problem Relation Age of Onset  . Alcohol abuse Other   . Arthritis Other   . Hypertension Other   . Diabetes Other   . Obesity Mother   . Hypertension Mother   . Obesity Father   . Hypertension Father   . Cancer Neg Hx   . Heart disease Neg Hx   . Stroke Neg Hx   . Kidney disease Neg Hx     Past Medical History:  Diagnosis Date  . Asthma   . Diabetes mellitus without complication ([place])   . Environmental allergies     No past surgical history on file.  Current Outpatient Prescriptions  Medication Sig Dispense Refill  . Azilsartan-Chlorthalidone 40-25 MG TABS Take 0.5 tablets by mouth daily. 35 tablet 0  . Blood Glucose Monitoring Suppl (ONETOUCH VERIO FLEX SYSTEM) w/Device KIT 1 Act by Does not apply route 3 (three) times daily. 2 kit 0  . Dapagliflozin-Metformin HCl ER (XIGDUO XR) 10-998 MG TB24 Take 2 tablets by mouth daily. 180 tablet 1  . glucose blood (ONETOUCH VERIO) test strip Use TID 100 each 12  . Insulin Degludec (TRESIBA FLEXTOUCH) 200 UNIT/ML SOPN Inject 50 Units into the skin daily. 3 mL 11  . Lancets MISC Use to check blood sugar twice daily. 100 each 2   No current facility-administered medications for this visit.     Allergies as of 01/01/2017  . (No Known Allergies)    Vitals: BP (!) 133/93   Pulse 84   Ht '6\' 5"'  (1.956 m)   Wt (!) 335 lb (152 kg)   BMI 39.73 kg/m  Last Weight:  Wt Readings from Last 1 Encounters:  01/01/17 (!) 335 lb (152 kg)   TOI:ZTIW mass index is 39.73 kg/m.     Last Height:    Ht Readings from Last 1 Encounters:  01/01/17 '6\' 5"'  (1.956 m)    Physical exam:  General: The patient is awake, alert and appears not in acute distress. The patient is well groomed. Head: Normocephalic, atraumatic. Neck is supple. Mallampati 5, large tongue.   neck circumference:19. Nasal airflow patent, TMJ is not  evident . Retrognathia is seen.  Cardiovascular:  Regular rate and  rhythm , without  murmurs or carotid bruit, and without distended neck veins. Respiratory: Lungs are clear to auscultation. Skin:  Without evidence of edema, or rash Trunk: BMI is 40. The patient's posture is erect  Neurologic exam : The patient is awake and alert, oriented to place and time.   Memory subjective described as intact.  Attention span & concentration ability appears normal.  Speech is fluent,  without dysarthria, dysphonia or aphasia.  Mood and affect are appropriate.  Cranial nerves: Pupils are equal and briskly reactive to light. Funduscopic exam without evidence of pallor or edema. Extraocular movements  in vertical and horizontal planes intact and without nystagmus. Visual fields by finger perimetry are intact. Hearing to finger rub intact.  Facial sensation intact to fine touch. Facial motor strength is symmetric and tongue and uvula move midline. Shoulder shrug was symmetrical.  Motor exam:  Normal tone, muscle bulk and symmetric strength in all extremities. Sensory:  Fine touch, pinprick and vibration were tested in all extremities. Proprioception tested in the upper extremities was normal. Coordination: Rapid alternating movements in the fingers/hands was normal. Finger-to-nose maneuver  normal without evidence of ataxia, dysmetria or tremor. Gait and station: Patient walks without assistive device and is able unassisted to climb up to the exam table. Strength within normal limits.Stance is stable and normal.  Turns with 3 Steps. Romberg testing is negative.  Deep tendon reflexes: in  the  upper and lower extremities are symmetric and intact. Babinski maneuver response is downgoing.  Assessment:  After physical and neurologic examination, review of laboratory studies,  Personal review of imaging studies, reports of other /same  Imaging studies, results of polysomnography and / or neurophysiology testing and pre-existing records as far as provided in visit., my assessment is   1) Mr. Cardiff presents with a completely normal neurologic exam but he has multiple risk factors for sleep apnea. He has a Mallampati grade 5 with a large tongue and a small upper airway and general, his neck circumference is 19 inches, he has a slight retrognathia. His body mass index is 40.   The symptom of daytime sleepiness can easily be related to obstructive sleep apnea or to others sleep interruptions.  He reports a fragmented sleep sometimes insomnia but has no trouble initiating sleep at all only staying asleep can be a problem some night.   2) the patient has the comorbidities of diabetes mellitus and hypertension but no coronary artery disease, asthma ; he was prescribed a albuterol inhaler but hasn't used it.   The patient was advised of the nature of the diagnosed disorder , the treatment options and the  risks for general health and wellness arising from not treating the condition.   I spent more than 30 minutes of face to face time with the patient.  Greater than 50% of time was spent in counseling and coordination of care. We have discussed the diagnosis and differential and I answered the patient's questions.    Plan:  Treatment plan and additional workup : Mr. Bebo already took steps to reduce his body weight, he has eliminated sweets he and soda for most of his days, he does not drink any alcohol, he is in the process of establishing an exercise regimen. Given all the risk factors he currently has his highest most significant risk factor is his body mass index which not just is a  risk factor for obstructive sleep apnea but also for diabetes and hypertension. I will invite Mr. Barefoot for  a attended sleep study split night protocol, if his insurance permits. He reports some morning headaches when waking up and it would be beneficial to have capnography study alongside his sleep study. We will ask his [facility] carrier for permission, if we cannot obtain this preauthorization- I would perform a home sleep test.  Rv after sleep study, either SPLIT or HST. Capnography  If the fine sleep disordered breathing and treat the patient sufficiently for 30 days or more we will have to investigate again if his Epworth sleepiness score is reduced and if not work and further up for possible narcolepsy / see review of systems.  Larey Seat, MD 11/04/1252, 8:32 PM  Certified in Neurology by ABPN Certified in [place] by [facility] [address], [place], [place] [address]

## 2017-01-01 NOTE — Patient Instructions (Signed)
Hypersomnia Hypersomnia is when you feel extremely tired during the day even though you're getting plenty of sleep at night. You may need to take naps during the day, and you may also be extremely difficult to wake up when you are sleeping. What are the causes? The cause of your hypersomnia may not be known. Hypersomnia may be caused by:  Medicines.  Sleep disorders, such as narcolepsy.  Trauma or injury to your head or nervous system.  Using drugs or alcohol.  Tumors.  Medical conditions, such as depression or hypothyroidism.  Genetics.  What are the signs or symptoms? The main symptoms of hypersomnia include:  Feeling extremely tired throughout the day.  Being very difficult to wake up.  Sleeping for longer and longer periods.  Taking naps throughout the day.  Other symptoms may include:  Feeling: ? Restless. ? Annoyed. ? Anxious. ? Low energy.  Having difficulty: ? Remembering. ? Speaking. ? Thinking.  Losing your appetite.  Experiencing hallucinations.  How is this diagnosed? Hypersomnia may be diagnosed by:  Medical history and physical exam. This will include a sleep history.  Completing sleep logs.  Tests may also be done, such as: ? Polysomnography. ? Multiple sleep latency test (MSLT).  How is this treated? There is no cure for hypersomnia, but treatment can be very effective in helping manage the condition. Treatment may include:  Lifestyle and sleeping strategies to help cope with the condition.  Stimulant medicines.  Treating any underlying causes of hypersomnia.  Follow these instructions at home:  Take medicines only as directed by your health care provider.  Schedule short naps for when you feel sleepiest during the day. Tell your employer or teachers that you have hypersomnia. You may be able to adjust your schedule to include time for naps.  Avoid drinking alcohol or caffeinated beverages.  Do not eat a heavy meal before  bedtime. Eat at about the same times every day.  Do not drive or operate heavy machinery if you are sleepy.  Do not swim or go out on the water without a life jacket.  If possible, adjust your schedule so that you do not have to work or be active at night.  Keep all follow-up visits as directed by your health care provider. This is important. Contact a health care provider if:  You have new symptoms.  Your symptoms get worse. Get help right away if: You have serious thoughts of hurting yourself or someone else. This information is not intended to replace advice given to you by your health care provider. Make sure you discuss any questions you have with your health care provider. Document Released: 06/08/2002 Document Revised: 11/24/2015 Document Reviewed: 01/21/2014 Elsevier Interactive Patient Education  2018 Elsevier Inc.  

## 2017-01-04 ENCOUNTER — Telehealth: Payer: Self-pay | Admitting: Internal Medicine

## 2017-01-04 DIAGNOSIS — E669 Obesity, unspecified: Principal | ICD-10-CM

## 2017-01-04 DIAGNOSIS — I10 Essential (primary) hypertension: Secondary | ICD-10-CM

## 2017-01-04 DIAGNOSIS — E1169 Type 2 diabetes mellitus with other specified complication: Secondary | ICD-10-CM

## 2017-01-04 MED ORDER — AZILSARTAN-CHLORTHALIDONE 40-25 MG PO TABS: 0.5000 | ORAL_TABLET | Freq: Every day | ORAL | 0 refills | Status: DC

## 2017-01-04 NOTE — Telephone Encounter (Signed)
Azilsartan-Chlorthalidone 40-25 MG TABS   Patient [facility]. Thank you.

## 2017-01-07 NOTE — Telephone Encounter (Signed)
rx was sent on Friday

## 2017-01-11 ENCOUNTER — Telehealth: Payer: Self-pay | Admitting: Internal Medicine

## 2017-01-11 MED FILL — EDARBYCLOR 40-25 MG TABLET: 40-25 | 30 days supply | Qty: 15 | Fill #0

## 2017-01-11 NOTE — Telephone Encounter (Signed)
Has the PA for this pt been started yet. I will start if not.

## 2017-01-11 NOTE — Telephone Encounter (Signed)
It has not been. I only take care of the prior auth's for our office as far as I know.

## 2017-01-11 NOTE — Telephone Encounter (Signed)
Gotcha, thank you!

## 2017-01-11 NOTE — Telephone Encounter (Signed)
Pt wife called in and would like to know if Dr Maudry Mayhewjone has samples of bp meds while the PA is going on?  Or is there a different med that Dr Yetta Barrejones would like for him to go on?     Best number [phone]

## 2017-01-11 NOTE — Telephone Encounter (Signed)
Dr. SwazilandJordan sent in the last rx for the medication so the pharmacy would have sent it to your office even though Stephen Hall is PCP.   Starting PA now.

## 2017-01-11 NOTE — Telephone Encounter (Signed)
PA started via cover my meds: with clinical documentation  Key: [id number]

## 2017-01-11 NOTE — Telephone Encounter (Signed)
PA has been approved.   Called patient spouse and informed of same. They will contact pharmacy to have the fill the medication.

## 2017-01-18 ENCOUNTER — Ambulatory Visit (INDEPENDENT_AMBULATORY_CARE_PROVIDER_SITE_OTHER): Payer: BC Managed Care – PPO | Admitting: Neurology

## 2017-01-18 DIAGNOSIS — G4719 Other hypersomnia: Secondary | ICD-10-CM

## 2017-01-18 DIAGNOSIS — R441 Visual hallucinations: Secondary | ICD-10-CM

## 2017-01-18 DIAGNOSIS — G4759 Other parasomnia: Secondary | ICD-10-CM

## 2017-01-18 DIAGNOSIS — G4710 Hypersomnia, unspecified: Secondary | ICD-10-CM

## 2017-01-18 DIAGNOSIS — E669 Obesity, unspecified: Secondary | ICD-10-CM

## 2017-01-18 DIAGNOSIS — R002 Palpitations: Secondary | ICD-10-CM

## 2017-01-18 DIAGNOSIS — R0689 Other abnormalities of breathing: Secondary | ICD-10-CM

## 2017-01-30 ENCOUNTER — Telehealth: Payer: Self-pay | Admitting: Neurology

## 2017-01-30 NOTE — Telephone Encounter (Signed)
Called to discuss sleep study results. No answer at this time. LVM for pt to return call. 

## 2017-01-31 NOTE — Telephone Encounter (Signed)
Made 2nd attempt to call sleep study results to the patient. No answer. LVM for pt to call back.

## 2017-02-07 NOTE — Telephone Encounter (Signed)
Pt returned call and I was able to discuss his sleep study results. Informed the pt that Dr Vickey Hugerohmeier stated the pt didn't have any episodes of apnea, restlessness or cardiac changes. She also said she did not notice any problems with oxygen desaturation. Pt did have snoring that was noted. Overall pt's sleep efficiency was 83%.  I went over in detail the recommendations listed by Dr Dohmeier at this time including good sleep hygiene. I offered that we could provide a narcolepsy work up. Pt denied at this time but stated would call back to follow up if he felt that necessary. I also recommended a dental device referral for pt to help with snoring. Pt declined that as well at this time. Pt verbalized understanding and had no further questions at this time.

## 2017-02-15 ENCOUNTER — Other Ambulatory Visit: Payer: Self-pay | Admitting: Internal Medicine

## 2017-02-15 DIAGNOSIS — E669 Obesity, unspecified: Principal | ICD-10-CM

## 2017-02-15 DIAGNOSIS — E1169 Type 2 diabetes mellitus with other specified complication: Secondary | ICD-10-CM

## 2017-02-15 MED FILL — XIGDUO XR 5 MG-1,000 MG TAB: 5-1000 | 90 days supply | Qty: 180 | Fill #0

## 2017-02-15 MED FILL — EDARBYCLOR 40-25 MG TABLET: 40-25 | 30 days supply | Qty: 15 | Fill #1

## 2017-03-01 ENCOUNTER — Ambulatory Visit (INDEPENDENT_AMBULATORY_CARE_PROVIDER_SITE_OTHER): Payer: BC Managed Care – PPO | Admitting: Endocrinology

## 2017-03-01 ENCOUNTER — Encounter: Payer: Self-pay | Admitting: Endocrinology

## 2017-03-01 VITALS — BP 122/80 | HR 88 | Wt 332.8 lb

## 2017-03-01 DIAGNOSIS — E1169 Type 2 diabetes mellitus with other specified complication: Secondary | ICD-10-CM | POA: Diagnosis not present

## 2017-03-01 DIAGNOSIS — E669 Obesity, unspecified: Secondary | ICD-10-CM | POA: Diagnosis not present

## 2017-03-01 LAB — POCT GLYCOSYLATED HEMOGLOBIN (HGB A1C): HEMOGLOBIN A1C: 7.8

## 2017-03-01 NOTE — Progress Notes (Signed)
Subjective:    Patient ID: Stephen Hall, male    DOB: 1980/07/20, 36 y.o.   MRN: [id number]  HPI Pt returns for f/u of diabetes mellitus: DM type: Insulin-requiring type 2 Dx'ed: 4034 Complications: none Therapy: insulin since 2017, and 2 oral meds.   DKA: never Severe hypoglycemia: never. Pancreatitis: never.  Other: he declines multiple daily injections; works as Futures trader.   Interval history: no cbg record, but states cbg's are in the low-100's fasting, which is the only time of days he checks.  pt states he feels well in general, except for fatigue.  He is working towards weight loss surgery.  Past Medical History:  Diagnosis Date  . Asthma   . Diabetes mellitus without complication ([place])   . Environmental allergies     No past surgical history on file.  Social History   Social History  . Marital status: Married    Spouse name: N/A  . Number of children: N/A  . Years of education: N/A   Occupational History  . Not on file.   Social History Main Topics  . Smoking status: Never Smoker  . Smokeless tobacco: Never Used  . Alcohol use No  . Drug use: No  . Sexual activity: Yes   Other Topics Concern  . Not on file   Social History Narrative   Caffienated drinks-yes   Seat belt use often-yes   Regular Exercise-no   Smoke alarm in the home-yes   Firearms/guns in the home-no   History of physical abuse-no                Current Outpatient Prescriptions on File Prior to Visit  Medication Sig Dispense Refill  . Azilsartan-Chlorthalidone 40-25 MG TABS Take 0.5 tablets by mouth daily. 35 tablet 0  . Blood Glucose Monitoring Suppl (ONETOUCH VERIO FLEX SYSTEM) w/Device KIT 1 Act by Does not apply route 3 (three) times daily. 2 kit 0  . glucose blood (ONETOUCH VERIO) test strip Use TID 100 each 12  . Insulin Degludec (TRESIBA FLEXTOUCH) 200 UNIT/ML SOPN Inject 50 Units into the skin daily. 3 mL 11  . Lancets MISC Use to check blood sugar twice  daily. 100 each 2  . XIGDUO XR 10-998 MG TB24 TAKE 2 TABLETS BY MOUTH DAILY. 180 tablet 0   No current facility-administered medications on file prior to visit.     No Known Allergies  Family History  Problem Relation Age of Onset  . Alcohol abuse Other   . Arthritis Other   . Hypertension Other   . Diabetes Other   . Obesity Mother   . Hypertension Mother   . Obesity Father   . Hypertension Father   . Cancer Neg Hx   . Heart disease Neg Hx   . Stroke Neg Hx   . Kidney disease Neg Hx     BP 122/80   Pulse 88   Wt (!) 332 lb 12.8 oz (151 kg)   SpO2 98%   BMI 39.46 kg/m    Review of Systems He denies hypoglycemia.  He has intermitt palpitations.     Objective:   Physical Exam VITAL SIGNS:  See vs page. GENERAL: no distress Pulses: foot pulses are intact bilaterally.   MSK: no deformity of the feet or ankles.  CV: trace bilat edema of the legs.   Skin:  no ulcer on the feet or ankles.  normal color and temp on the feet and ankles.  Neuro: sensation is intact to  touch on the feet and ankles.    Lab Results  Component Value Date   TSH 2.49 07/23/2016   A1c=7.8%     Assessment & Plan:  Insulin-requiring type 2 DM: this is the best control this pt should aim for, given this regimen, which does match insulin to his changing needs throughout the day.   Patient Instructions  Please continue tresiba, 50 units daily, and the diabetes pill.  check your blood sugar twice a day.  vary the time of day when you check, between before the 3 meals, and at bedtime.  also check if you have symptoms of your blood sugar being too high or too low.  please keep a record of the readings and bring it to your next appointment here (or you can bring the meter itself).  You can write it on any piece of paper.  please call us sooner if your blood sugar goes below 70, or if you have a lot of readings over 200.  also please check if you have the heart racing.  Please come back for a follow-up  appointment in 4 months.

## 2017-03-01 NOTE — Patient Instructions (Addendum)
Please continue tresiba, 50 units daily, and the diabetes pill.  check your blood sugar twice a day.  vary the time of day when you check, between before the 3 meals, and at bedtime.  also check if you have symptoms of your blood sugar being too high or too low.  please keep a record of the readings and bring it to your next appointment here (or you can bring the meter itself).  You can write it on any piece of paper.  please call us sooner if your blood sugar goes below 70, or if you have a lot of readings over 200.  also please check if you have the heart racing.  Please come back for a follow-up appointment in 4 months.

## 2017-04-22 ENCOUNTER — Other Ambulatory Visit: Payer: Self-pay | Admitting: Family Medicine

## 2017-04-22 DIAGNOSIS — E1169 Type 2 diabetes mellitus with other specified complication: Secondary | ICD-10-CM

## 2017-04-22 DIAGNOSIS — E669 Obesity, unspecified: Principal | ICD-10-CM

## 2017-04-22 DIAGNOSIS — I10 Essential (primary) hypertension: Secondary | ICD-10-CM

## 2017-04-22 MED FILL — EDARBYCLOR 40-25 MG TABLET: 40-25 | 30 days supply | Qty: 15 | Fill #0

## 2017-04-22 MED FILL — TRESIBA FLEXTOUCH 200 UNITS: 200 | 30 days supply | Qty: 9 | Fill #3

## 2017-05-21 ENCOUNTER — Ambulatory Visit (INDEPENDENT_AMBULATORY_CARE_PROVIDER_SITE_OTHER): Payer: BC Managed Care – PPO | Admitting: Emergency Medicine

## 2017-05-21 DIAGNOSIS — Z23 Encounter for immunization: Secondary | ICD-10-CM

## 2017-06-04 NOTE — Telephone Encounter (Signed)
Error

## 2017-06-20 MED FILL — EDARBYCLOR 40-25 MG TABLET: 40-25 | 30 days supply | Qty: 15 | Fill #1

## 2017-07-31 MED FILL — EDARBYCLOR 40-25 MG TABLET: 40-25 | 30 days supply | Qty: 15 | Fill #2

## 2017-09-27 ENCOUNTER — Other Ambulatory Visit: Payer: Self-pay | Admitting: Internal Medicine

## 2017-09-27 DIAGNOSIS — E1169 Type 2 diabetes mellitus with other specified complication: Secondary | ICD-10-CM

## 2017-09-27 DIAGNOSIS — E669 Obesity, unspecified: Principal | ICD-10-CM

## 2017-09-27 MED FILL — EDARBYCLOR 40-25 MG TABLET: 40-25 | 30 days supply | Qty: 15 | Fill #3

## 2017-09-27 NOTE — Telephone Encounter (Signed)
All medications requested have been refused until pt can make an appt.

## 2017-09-27 NOTE — Telephone Encounter (Signed)
CPE set up for April

## 2017-09-30 ENCOUNTER — Telehealth: Payer: Self-pay | Admitting: Internal Medicine

## 2017-09-30 DIAGNOSIS — E669 Obesity, unspecified: Principal | ICD-10-CM

## 2017-09-30 DIAGNOSIS — E1169 Type 2 diabetes mellitus with other specified complication: Secondary | ICD-10-CM

## 2017-09-30 MED ORDER — ONETOUCH DELICA LANCETS 33G MISC: 0 refills | Status: DC

## 2017-09-30 MED ORDER — GLUCOSE BLOOD VI STRP: ORAL_STRIP | 0 refills | Status: DC

## 2017-09-30 MED ORDER — INSULIN DEGLUDEC 200 UNIT/ML ~~LOC~~ SOPN: 50.0000 [IU] | PEN_INJECTOR | Freq: Every day | SUBCUTANEOUS | 0 refills | Status: DC

## 2017-09-30 MED ORDER — DAPAGLIFLOZIN PRO-METFORMIN ER 5-1000 MG PO TB24: 2.0000 | ORAL_TABLET | Freq: Every day | ORAL | 0 refills | Status: DC

## 2017-09-30 MED FILL — XIGDUO XR 5 MG-1,000 MG TAB: 5-1000 | 30 days supply | Qty: 60 | Fill #0

## 2017-09-30 MED FILL — ONETOUCH VERIO TEST STRIP: 33 days supply | Qty: 100 | Fill #0

## 2017-09-30 MED FILL — ONE TOUCH DELICA 33G LANCET: 33 days supply | Qty: 100 | Fill #0

## 2017-09-30 NOTE — Telephone Encounter (Signed)
Per office policy sent 30 day to local pharmacy until appt.../lmb  

## 2017-09-30 NOTE — Telephone Encounter (Signed)
Copied from CRM [id number]. Topic: Quick Communication - See Telephone Encounter >> Sep 30, 2017 12:09 PM Landry MellowFoltz, Melissa J wrote: CRM for notification. See Telephone encounter for: 09/30/17. [facility] called about Insulin Degludec (TRESIBA FLEXTOUCH) 200 UNIT/ML SOPN.  The rx is written as 3 ml as qty, which is 4 day supply. Pharm wants to make sure this is correct.  [facility] is [phone]

## 2017-09-30 NOTE — Addendum Note (Signed)
Addended by: Deatra JamesBRAND, Orlandria Kissner M on: 09/30/2017 10:52 AM   Modules accepted: Orders

## 2017-10-01 MED ORDER — INSULIN DEGLUDEC 200 UNIT/ML ~~LOC~~ SOPN: 50.0000 [IU] | PEN_INJECTOR | Freq: Every day | SUBCUTANEOUS | 0 refills | Status: DC

## 2017-10-01 NOTE — Telephone Encounter (Signed)
Resent rx for 15 pens.

## 2017-10-17 ENCOUNTER — Encounter: Payer: BC Managed Care – PPO | Admitting: Internal Medicine

## 2017-10-17 MED FILL — TRESIBA FLEXTOUCH 200 UNITS: 200 | 72 days supply | Qty: 18 | Fill #0

## 2017-11-20 ENCOUNTER — Encounter: Payer: BC Managed Care – PPO | Admitting: Internal Medicine

## 2017-12-18 ENCOUNTER — Encounter: Payer: Self-pay | Admitting: Internal Medicine

## 2017-12-18 ENCOUNTER — Ambulatory Visit (INDEPENDENT_AMBULATORY_CARE_PROVIDER_SITE_OTHER): Payer: BC Managed Care – PPO | Admitting: Internal Medicine

## 2017-12-18 ENCOUNTER — Other Ambulatory Visit (INDEPENDENT_AMBULATORY_CARE_PROVIDER_SITE_OTHER): Payer: BC Managed Care – PPO

## 2017-12-18 VITALS — BP 140/90 | HR 76 | Temp 98.3°F | Resp 16 | Ht 77.0 in | Wt 333.5 lb

## 2017-12-18 DIAGNOSIS — Z0000 Encounter for general adult medical examination without abnormal findings: Secondary | ICD-10-CM

## 2017-12-18 DIAGNOSIS — E1169 Type 2 diabetes mellitus with other specified complication: Secondary | ICD-10-CM

## 2017-12-18 DIAGNOSIS — E669 Obesity, unspecified: Secondary | ICD-10-CM | POA: Diagnosis not present

## 2017-12-18 DIAGNOSIS — I10 Essential (primary) hypertension: Secondary | ICD-10-CM

## 2017-12-18 DIAGNOSIS — E559 Vitamin D deficiency, unspecified: Secondary | ICD-10-CM | POA: Diagnosis not present

## 2017-12-18 DIAGNOSIS — E785 Hyperlipidemia, unspecified: Secondary | ICD-10-CM

## 2017-12-18 LAB — CBC WITH DIFFERENTIAL/PLATELET
Basophils Absolute: 0.1 10*3/uL (ref 0.0–0.1)
Basophils Relative: 1.1 % (ref 0.0–3.0)
EOS PCT: 1.9 % (ref 0.0–5.0)
Eosinophils Absolute: 0.1 10*3/uL (ref 0.0–0.7)
HCT: 46.5 % (ref 39.0–52.0)
Hemoglobin: 15.2 g/dL (ref 13.0–17.0)
LYMPHS ABS: 2.2 10*3/uL (ref 0.7–4.0)
Lymphocytes Relative: 32 % (ref 12.0–46.0)
MCHC: 32.6 g/dL (ref 30.0–36.0)
MCV: 77.8 fl — ABNORMAL LOW (ref 78.0–100.0)
MONO ABS: 0.6 10*3/uL (ref 0.1–1.0)
MONOS PCT: 7.9 % (ref 3.0–12.0)
NEUTROS ABS: 4 10*3/uL (ref 1.4–7.7)
NEUTROS PCT: 57.1 % (ref 43.0–77.0)
PLATELETS: 258 10*3/uL (ref 150.0–400.0)
RBC: 5.98 Mil/uL — ABNORMAL HIGH (ref 4.22–5.81)
RDW: 14.8 % (ref 11.5–15.5)
WBC: 7 10*3/uL (ref 4.0–10.5)

## 2017-12-18 LAB — URINALYSIS, ROUTINE W REFLEX MICROSCOPIC
Bilirubin Urine: NEGATIVE
HGB URINE DIPSTICK: NEGATIVE
Ketones, ur: NEGATIVE
LEUKOCYTES UA: NEGATIVE
Nitrite: NEGATIVE
RBC / HPF: NONE SEEN (ref 0–?)
SPECIFIC GRAVITY, URINE: 1.01 (ref 1.000–1.030)
Total Protein, Urine: NEGATIVE
Urine Glucose: >=1000 — AB
Urobilinogen, UA: 0.2 (ref 0.0–1.0)
WBC UA: NONE SEEN (ref 0–?)
pH: 6 (ref 5.0–8.0)

## 2017-12-18 LAB — COMPREHENSIVE METABOLIC PANEL
ALBUMIN: 4.6 g/dL (ref 3.5–5.2)
ALK PHOS: 95 U/L (ref 39–117)
ALT: 18 U/L (ref 0–53)
AST: 11 U/L (ref 0–37)
BUN: 9 mg/dL (ref 6–23)
CHLORIDE: 97 meq/L (ref 96–112)
CO2: 27 mEq/L (ref 19–32)
CREATININE: 1 mg/dL (ref 0.40–1.50)
Calcium: 10.1 mg/dL (ref 8.4–10.5)
GFR: 108.94 mL/min (ref >60.00)
GLUCOSE: 261 mg/dL — AB (ref 70–99)
Potassium: 4.2 mEq/L (ref 3.5–5.1)
SODIUM: 136 meq/L (ref 135–145)
TOTAL PROTEIN: 8.2 g/dL (ref 6.0–8.3)
Total Bilirubin: 0.4 mg/dL (ref 0.2–1.2)

## 2017-12-18 LAB — LIPID PANEL
Cholesterol: 263 mg/dL — ABNORMAL HIGH (ref 0–200)
HDL: 37.4 mg/dL — AB (ref >39.00)
LDL Cholesterol: 199 mg/dL — ABNORMAL HIGH (ref 0–99)
NonHDL: 225.46
TRIGLYCERIDES: 130 mg/dL (ref 0.0–149.0)
Total CHOL/HDL Ratio: 7
VLDL: 26 mg/dL (ref 0.0–40.0)

## 2017-12-18 LAB — POCT GLYCOSYLATED HEMOGLOBIN (HGB A1C): HEMOGLOBIN A1C: 11.8 % — AB (ref 4.0–5.6)

## 2017-12-18 LAB — POCT GLUCOSE (DEVICE FOR HOME USE): Glucose Fasting, POC: 266 mg/dL — AB (ref 70–99)

## 2017-12-18 LAB — VITAMIN D 25 HYDROXY (VIT D DEFICIENCY, FRACTURES): VITD: 17.63 ng/mL — AB (ref 30.00–100.00)

## 2017-12-18 LAB — MICROALBUMIN / CREATININE URINE RATIO
Creatinine,U: 96.3 mg/dL
Microalb Creat Ratio: 3.7 mg/g (ref 0.0–30.0)
Microalb, Ur: 3.6 mg/dL — ABNORMAL HIGH (ref 0.0–1.9)

## 2017-12-18 LAB — TSH: TSH: 2.05 u[IU]/mL (ref 0.35–4.50)

## 2017-12-18 MED ORDER — LIRAGLUTIDE -WEIGHT MANAGEMENT 18 MG/3ML ~~LOC~~ SOPN: 3.0000 mg | PEN_INJECTOR | Freq: Every day | SUBCUTANEOUS | 5 refills | Status: DC

## 2017-12-18 MED ORDER — INSULIN GLARGINE 300 UNIT/ML ~~LOC~~ SOPN: 30.0000 [IU] | PEN_INJECTOR | Freq: Every day | SUBCUTANEOUS | 1 refills | Status: DC

## 2017-12-18 MED ORDER — VITAMIN D3 1.25 MG (50000 UT) PO TABS: 1.0000 | ORAL_TABLET | ORAL | 1 refills | Status: DC

## 2017-12-18 MED ORDER — INSULIN PEN NEEDLE 32G X 6 MM MISC: 2.0000 | Freq: Every day | 3 refills | Status: DC

## 2017-12-18 MED ORDER — DAPAGLIFLOZIN PRO-METFORMIN ER 5-1000 MG PO TB24: 2.0000 | ORAL_TABLET | Freq: Every day | ORAL | 1 refills | Status: DC

## 2017-12-18 MED FILL — VITAMIN D3 50000 UNIT CAPS: 1.25 MG | 84 days supply | Qty: 12 | Fill #0

## 2017-12-18 MED FILL — XIGDUO XR 5 MG-1,000 MG TAB: 5-1000 | 90 days supply | Qty: 180 | Fill #0

## 2017-12-18 MED FILL — EDARBYCLOR 40-25 MG TABLET: 40-25 | 30 days supply | Qty: 15 | Fill #4

## 2017-12-18 NOTE — Progress Notes (Signed)
Subjective:  Patient ID: Stephen Hall, male    DOB: August 27, 1980  Age: 37 y.o. MRN: [id number]  CC: Hypertension; Diabetes; and Annual Exam   HPI Stephen Hall presents for a CPX.  He complains that his blood sugars have not been well controlled and he has recently experienced polys.  For the most part he has not been compliant with his diabetic regimen.  He has been taking his antihypertensives and feels like his blood pressure has been well controlled.  He has been evaluated for OSA but he tells me that his sleep study was negative for apnea.  He denies any recent episodes of headache, blurred vision, chest pain, shortness of breath, palpitations, edema, or fatigue.   Outpatient Medications Prior to Visit  Medication Sig Dispense Refill  . Blood Glucose Monitoring Suppl (ONETOUCH VERIO FLEX SYSTEM) w/Device KIT 1 Act by Does not apply route 3 (three) times daily. 2 kit 0  . EDARBYCLOR 40-25 MG TABS TAKE 1/2 TABLET BY MOUTH DAILY. 90 tablet 0  . glucose blood (ONETOUCH VERIO) test strip Use TID 100 each 0  . Lancets MISC Use to check blood sugar twice daily. 100 each 2  . ONETOUCH DELICA LANCETS 51W MISC Use to help check blood sugars three times a day 100 each 0  . Dapagliflozin-metFORMIN HCl ER (XIGDUO XR) 10-998 MG TB24 Take 2 tablets by mouth daily. Must keep schedule appt for future refills 60 tablet 0  . Insulin Degludec (TRESIBA FLEXTOUCH) 200 UNIT/ML SOPN Inject 50 Units into the skin daily. Must keep scheduled appt for future refills 15 pen 0   No facility-administered medications prior to visit.     ROS Review of Systems  Constitutional: Negative.  Negative for appetite change, diaphoresis and fatigue.  HENT: Negative.   Respiratory: Negative for apnea, cough, chest tightness, shortness of breath and wheezing.   Cardiovascular: Negative for chest pain, palpitations and leg swelling.  Gastrointestinal: Negative.  Negative for abdominal pain, diarrhea, nausea and vomiting.   Endocrine: Positive for polydipsia, polyphagia and polyuria.  Genitourinary: Negative.  Negative for difficulty urinating, dysuria, flank pain, scrotal swelling, testicular pain and urgency.  Musculoskeletal: Negative.  Negative for arthralgias and myalgias.  Skin: Negative.  Negative for color change and pallor.  Neurological: Negative.  Negative for dizziness, weakness, numbness and headaches.  Hematological: Negative for adenopathy. Does not bruise/bleed easily.  Psychiatric/Behavioral: Negative.     Objective:  BP 140/90 (BP Location: Left Arm, Patient Position: Sitting, Cuff Size: Large)   Pulse 76   Temp 98.3 F (36.8 C) (Oral)   Resp 16   Ht '6\' 5"'  (1.956 m)   Wt (!) 333 lb 8 oz (151.3 kg)   SpO2 96%   BMI 39.55 kg/m   BP Readings from Last 3 Encounters:  12/18/17 140/90  03/01/17 122/80  01/01/17 (!) 133/93    Wt Readings from Last 3 Encounters:  12/18/17 (!) 333 lb 8 oz (151.3 kg)  03/01/17 (!) 332 lb 12.8 oz (151 kg)  01/01/17 (!) 335 lb (152 kg)    Physical Exam  Constitutional: He is oriented to person, place, and time. No distress.  HENT:  Mouth/Throat: Oropharynx is clear and moist. No oropharyngeal exudate.  Eyes: Conjunctivae are normal. Left eye exhibits no discharge. No scleral icterus.  Neck: Normal range of motion. Neck supple. No JVD present. No thyromegaly present.  Cardiovascular: Normal rate, regular rhythm and normal heart sounds. Exam reveals no gallop.  No murmur heard. Pulmonary/Chest: Effort normal and breath  sounds normal. He has no wheezes. He has no rales.  Abdominal: Soft. Normal appearance and bowel sounds are normal. He exhibits no mass. There is no hepatosplenomegaly. There is no tenderness. No hernia.  Musculoskeletal: Normal range of motion. He exhibits no edema, tenderness or deformity.  Lymphadenopathy:    He has no cervical adenopathy.  Neurological: He is alert and oriented to person, place, and time.  Skin: Skin is warm and  dry. No rash noted. He is not diaphoretic.  Vitals reviewed.   Lab Results  Component Value Date   WBC 7.0 12/18/2017   HGB 15.2 12/18/2017   HCT 46.5 12/18/2017   PLT 258.0 12/18/2017   GLUCOSE 261 (H) 12/18/2017   CHOL 263 (H) 12/18/2017   TRIG 130.0 12/18/2017   HDL 37.40 (L) 12/18/2017   LDLDIRECT 158.1 02/07/2012   LDLCALC 199 (H) 12/18/2017   ALT 18 12/18/2017   AST 11 12/18/2017   NA 136 12/18/2017   K 4.2 12/18/2017   CL 97 12/18/2017   CREATININE 1.00 12/18/2017   BUN 9 12/18/2017   CO2 27 12/18/2017   TSH 2.05 12/18/2017   HGBA1C 11.8 (A) 12/18/2017   MICROALBUR 3.6 (H) 12/18/2017    No results found.  Assessment & Plan:   Stephen Hall was seen today for hypertension, diabetes and annual exam.  Diagnoses and all orders for this visit:  Essential hypertension - His blood pressure is not adequately well controlled.  I will treat the vitamin D deficiency.  Otherwise his lab work is negative for secondary causes or endorgan damage.  We will continue the combination of an ARB and thiazide diuretic. -     CBC with Differential/Platelet; Future -     TSH; Future -     VITAMIN D 25 Hydroxy (Vit-D Deficiency, Fractures); Future -     Comprehensive metabolic panel; Future -     Urinalysis, Routine w reflex microscopic; Future  Diabetes mellitus type 2 in obese ([place])- His A1c is up to 11.8% and he has symptoms of glucose toxicity.  I have asked him to restart the basal insulin.  Will add a GLP-1 agonist.  Will restart the metformin and SGLT2 inhibitor as well. -     Cancel: Hemoglobin A1c; Future -     Microalbumin / creatinine urine ratio; Future -     Comprehensive metabolic panel; Future -     Cancel: Ambulatory referral to Ophthalmology -     POCT glycosylated hemoglobin (Hb A1C) -     POCT Glucose (Device for Home Use) -     Ambulatory referral to Ophthalmology -     Amb Referral to Nutrition and Diabetic E -     Dapagliflozin-metFORMIN HCl ER (XIGDUO XR) 10-998 MG  TB24; Take 2 tablets by mouth daily. Must keep schedule appt for future refills -     Insulin Glargine (TOUJEO MAX SOLOSTAR) 300 UNIT/ML SOPN; Inject 30 Units into the skin daily. -     Insulin Pen Needle (NOVOFINE) 32G X 6 MM MISC; 2 Act by Does not apply route daily.  Routine general medical examination at a health care facility-exam completed, labs reviewed, vaccines reviewed, patient education material was given. -     Lipid panel; Future  Morbid obesity ([place])- Will start the high-dose GLP-1 agonist Saxenda.  He also wants to consider bariatric surgery. -     Ambulatory referral to General Surgery -     Liraglutide -Weight Management (SAXENDA) 18 MG/3ML SOPN; Inject 3 mg into  the skin daily. -     Insulin Pen Needle (NOVOFINE) 32G X 6 MM MISC; 2 Act by Does not apply route daily.  Vitamin D deficiency disease -     Cholecalciferol (VITAMIN D3) 50000 units TABS; Take 1 tablet by mouth once a week.  Hyperlipidemia with target LDL less than 130- He has an elevated LDL but at his age I do not think it is prudent to start a statin.   I have discontinued Stephen Hall's Insulin Degludec. I am also having him start on Liraglutide -Weight Management, Insulin Glargine, Insulin Pen Needle, and Vitamin D3. Additionally, I am having him maintain his ONETOUCH VERIO FLEX SYSTEM, Lancets, EDARBYCLOR, glucose blood, ONETOUCH DELICA LANCETS 09P, and Dapagliflozin-metFORMIN HCl ER.  Meds ordered this encounter  Medications  . Liraglutide -Weight Management (SAXENDA) 18 MG/3ML SOPN    Sig: Inject 3 mg into the skin daily.    Dispense:  5 pen    Refill:  5  . Dapagliflozin-metFORMIN HCl ER (XIGDUO XR) 10-998 MG TB24    Sig: Take 2 tablets by mouth daily. Must keep schedule appt for future refills    Dispense:  180 tablet    Refill:  1  . Insulin Glargine (TOUJEO MAX SOLOSTAR) 300 UNIT/ML SOPN    Sig: Inject 30 Units into the skin daily.    Dispense:  9 mL    Refill:  1  . Insulin Pen Needle  (NOVOFINE) 32G X 6 MM MISC    Sig: 2 Act by Does not apply route daily.    Dispense:  200 each    Refill:  3  . Cholecalciferol (VITAMIN D3) 50000 units TABS    Sig: Take 1 tablet by mouth once a week.    Dispense:  12 tablet    Refill:  1     Follow-up: Return in about 3 months (around 03/20/2018).  Scarlette Calico, MD

## 2017-12-18 NOTE — Patient Instructions (Signed)

## 2017-12-20 ENCOUNTER — Telehealth: Payer: Self-pay

## 2017-12-20 NOTE — Telephone Encounter (Signed)
Key: [id number]

## 2017-12-23 ENCOUNTER — Other Ambulatory Visit: Payer: Self-pay | Admitting: Internal Medicine

## 2017-12-23 DIAGNOSIS — E785 Hyperlipidemia, unspecified: Secondary | ICD-10-CM

## 2017-12-23 DIAGNOSIS — E1169 Type 2 diabetes mellitus with other specified complication: Secondary | ICD-10-CM

## 2017-12-23 DIAGNOSIS — E669 Obesity, unspecified: Secondary | ICD-10-CM

## 2017-12-23 MED ORDER — ROSUVASTATIN CALCIUM 10 MG PO TABS: 10.0000 mg | ORAL_TABLET | Freq: Every day | ORAL | 1 refills | Status: DC

## 2017-12-23 MED FILL — ROSUVASTATIN CALCIUM 10 MG: 10 | 90 days supply | Qty: 90 | Fill #0

## 2017-12-31 ENCOUNTER — Telehealth: Payer: Self-pay

## 2017-12-31 MED FILL — TOUJEO SOLOSTAR 300 UNITS/M: 300 | 90 days supply | Qty: 9 | Fill #0

## 2017-12-31 NOTE — Telephone Encounter (Signed)
Spoke with Stephen Hall today @ [phone] with [facility]. Medication approved for 12  Months. Approval # was [id number]. [facility] went [facility]. Left message for patient to return call to clinic and CRM created incase he returns call to clinic.

## 2018-01-03 ENCOUNTER — Encounter: Payer: Self-pay | Admitting: Internal Medicine

## 2018-01-03 MED FILL — SAXENDA 18 MG/3 ML PEN: 18 | 30 days supply | Qty: 15 | Fill #0

## 2018-01-03 NOTE — Telephone Encounter (Signed)
Copied from CRM [id number]. Topic: Quick Communication - See Telephone Encounter >> Jan 03, 2018  9:54 AM Tamela OddiMartin, Don'Quashia, NT wrote: CRM for notification. See Telephone encounter for: 01/03/18. [facility] needs clarification on the medication Liraglutide -Weight Management (SAXENDA) 18 MG/3ML SOPN . She is wanting to know we continuing  the Insulin Degludec (TRESIBA FLEXTOUCH) 200 UNIT/ML SOPN or is this replacing this medication. The second question is are we starting with the 3mg  a day, usually it is a build up .Marland Kitchen. Please call to clarify CB# [phone]  Thank you

## 2018-01-03 NOTE — Telephone Encounter (Signed)
Called [facility] and left a detailed message for pharmacist.   RE: pt is at the max dose per day of saxenda. Pt started medication via sample here in the office. The tresiba has been dc'ed and pt is now taking the toujeo and saxenda.

## 2018-01-18 ENCOUNTER — Other Ambulatory Visit: Payer: Self-pay | Admitting: Internal Medicine

## 2018-01-18 DIAGNOSIS — E669 Obesity, unspecified: Principal | ICD-10-CM

## 2018-01-18 DIAGNOSIS — E119 Type 2 diabetes mellitus without complications: Secondary | ICD-10-CM

## 2018-01-18 DIAGNOSIS — I10 Essential (primary) hypertension: Secondary | ICD-10-CM

## 2018-01-18 DIAGNOSIS — E1169 Type 2 diabetes mellitus with other specified complication: Secondary | ICD-10-CM

## 2018-01-18 MED ORDER — AZILSARTAN-CHLORTHALIDONE 40-25 MG PO TABS: 1.0000 | ORAL_TABLET | Freq: Every day | ORAL | 1 refills | Status: DC

## 2018-01-27 ENCOUNTER — Telehealth: Payer: Self-pay | Admitting: *Deleted

## 2018-01-27 ENCOUNTER — Encounter: Payer: Self-pay | Admitting: Internal Medicine

## 2018-01-27 ENCOUNTER — Other Ambulatory Visit: Payer: Self-pay | Admitting: Internal Medicine

## 2018-01-27 DIAGNOSIS — E669 Obesity, unspecified: Secondary | ICD-10-CM

## 2018-01-27 DIAGNOSIS — I10 Essential (primary) hypertension: Secondary | ICD-10-CM

## 2018-01-27 DIAGNOSIS — E1169 Type 2 diabetes mellitus with other specified complication: Secondary | ICD-10-CM

## 2018-01-27 MED ORDER — CANDESARTAN CILEXETIL-HCTZ 32-25 MG PO TABS: 1.0000 | ORAL_TABLET | Freq: Every day | ORAL | 1 refills | Status: DC

## 2018-01-27 MED FILL — CANDESARTAN-HCTZ 32-25 MG T: 32-25 | 90 days supply | Qty: 90 | Fill #0

## 2018-01-27 NOTE — Telephone Encounter (Signed)
Pt informed of rx change.  

## 2018-01-27 NOTE — Telephone Encounter (Signed)
PA was denied -   Alternatives to try and fail:   Candesartan HCTZ Irbesartan HCTZ Losartan HCTZ Olmesartan HCTZ Telmisartan HCTZ Valsartan HCTZ  Please advise

## 2018-01-27 NOTE — Telephone Encounter (Signed)
Edarbyclor PA initiated  Key: [id number] - PA Case ID: [id number]

## 2018-01-27 NOTE — Telephone Encounter (Signed)
changed

## 2018-03-14 ENCOUNTER — Other Ambulatory Visit: Payer: Self-pay | Admitting: Internal Medicine

## 2018-03-14 ENCOUNTER — Encounter: Payer: Self-pay | Admitting: Internal Medicine

## 2018-03-14 DIAGNOSIS — E1169 Type 2 diabetes mellitus with other specified complication: Secondary | ICD-10-CM

## 2018-03-14 DIAGNOSIS — E669 Obesity, unspecified: Principal | ICD-10-CM

## 2018-03-14 MED ORDER — GLUCOSE BLOOD VI STRP: ORAL_STRIP | 0 refills | Status: DC

## 2018-03-14 MED FILL — VITAMIN D3 50000 UNIT CAPS: 1.25 MG | 84 days supply | Qty: 12 | Fill #1

## 2018-03-14 MED FILL — ONE TOUCH DELICA 33G LANCET: 33 days supply | Qty: 100 | Fill #0

## 2018-03-14 MED FILL — ONETOUCH VERIO TEST STRIP: 66 days supply | Qty: 200 | Fill #0

## 2018-03-20 ENCOUNTER — Ambulatory Visit: Payer: BC Managed Care – PPO | Admitting: Internal Medicine

## 2018-04-20 IMAGING — DX DG CHEST 2V
2 series · 2 of 2 positions shown · non-contrast
Comparison: None in PACs

CLINICAL DATA: Cough, chest congestion, and weakness for the past 4
days. History of asthma, diabetes, nonsmoker.

EXAM:
CHEST  2 VIEW

[chest pa]
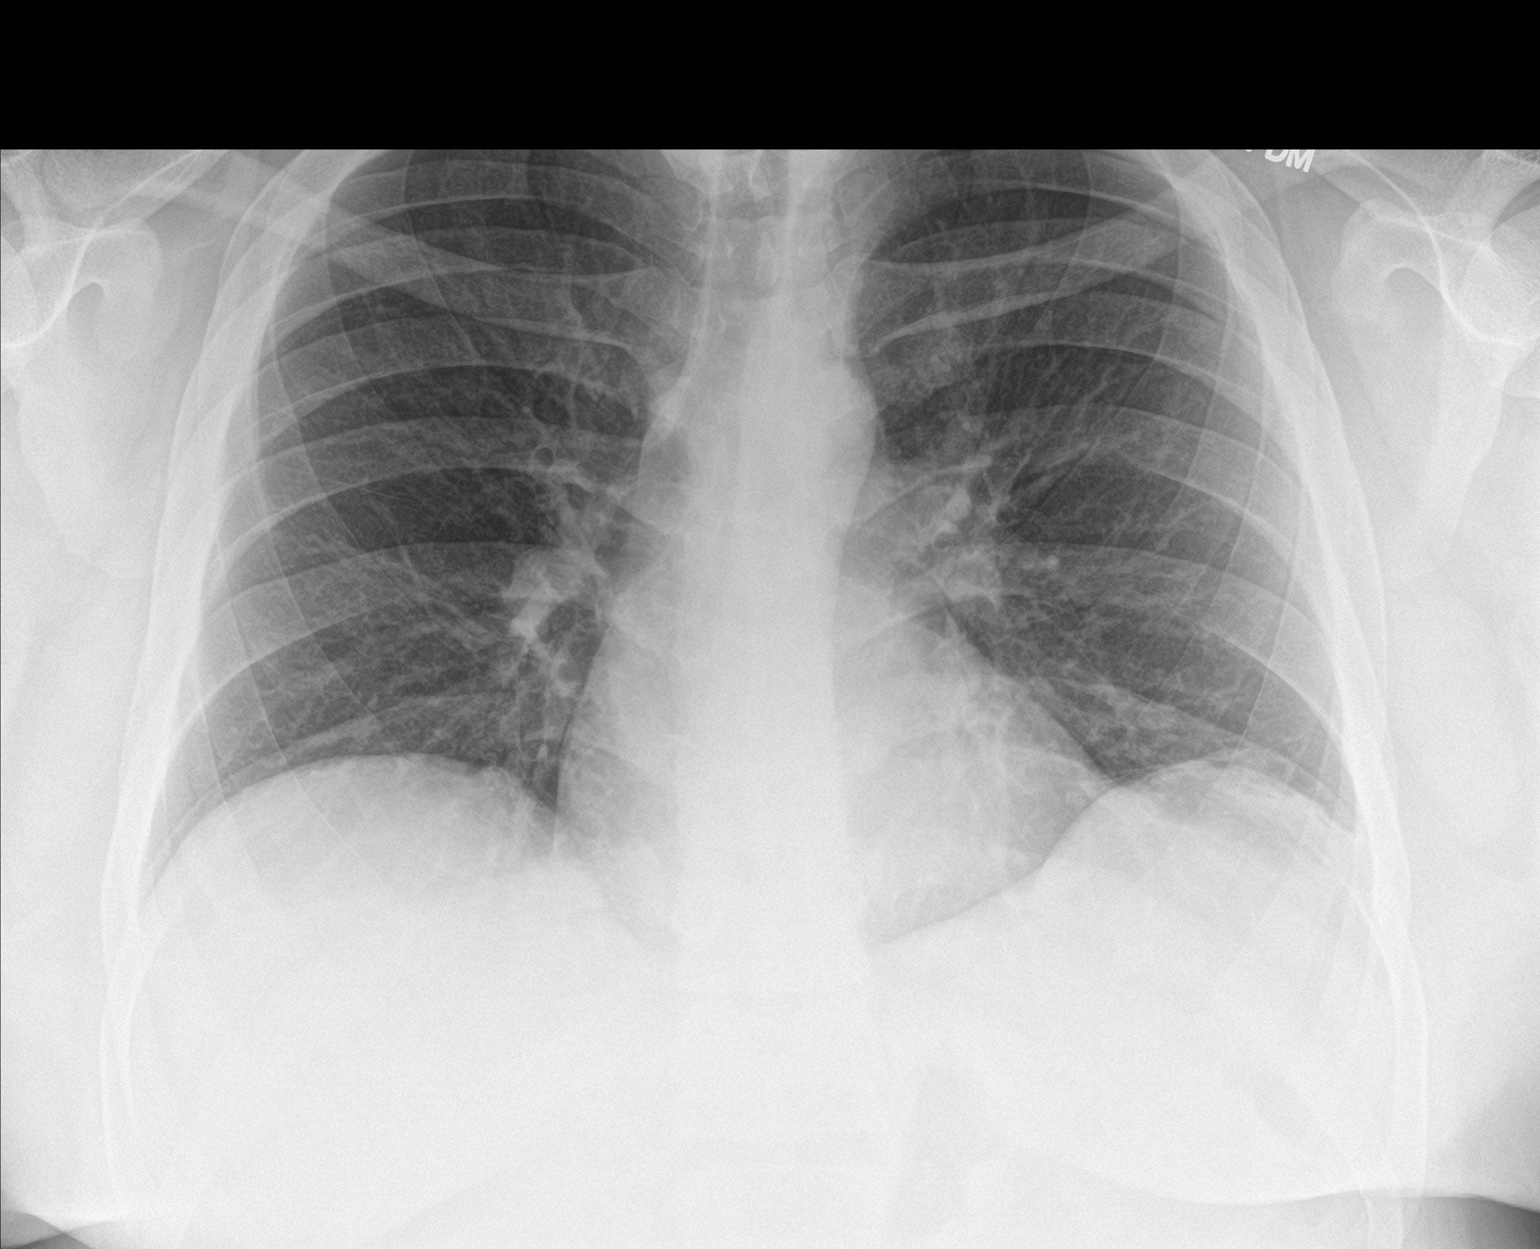

[chest lat]
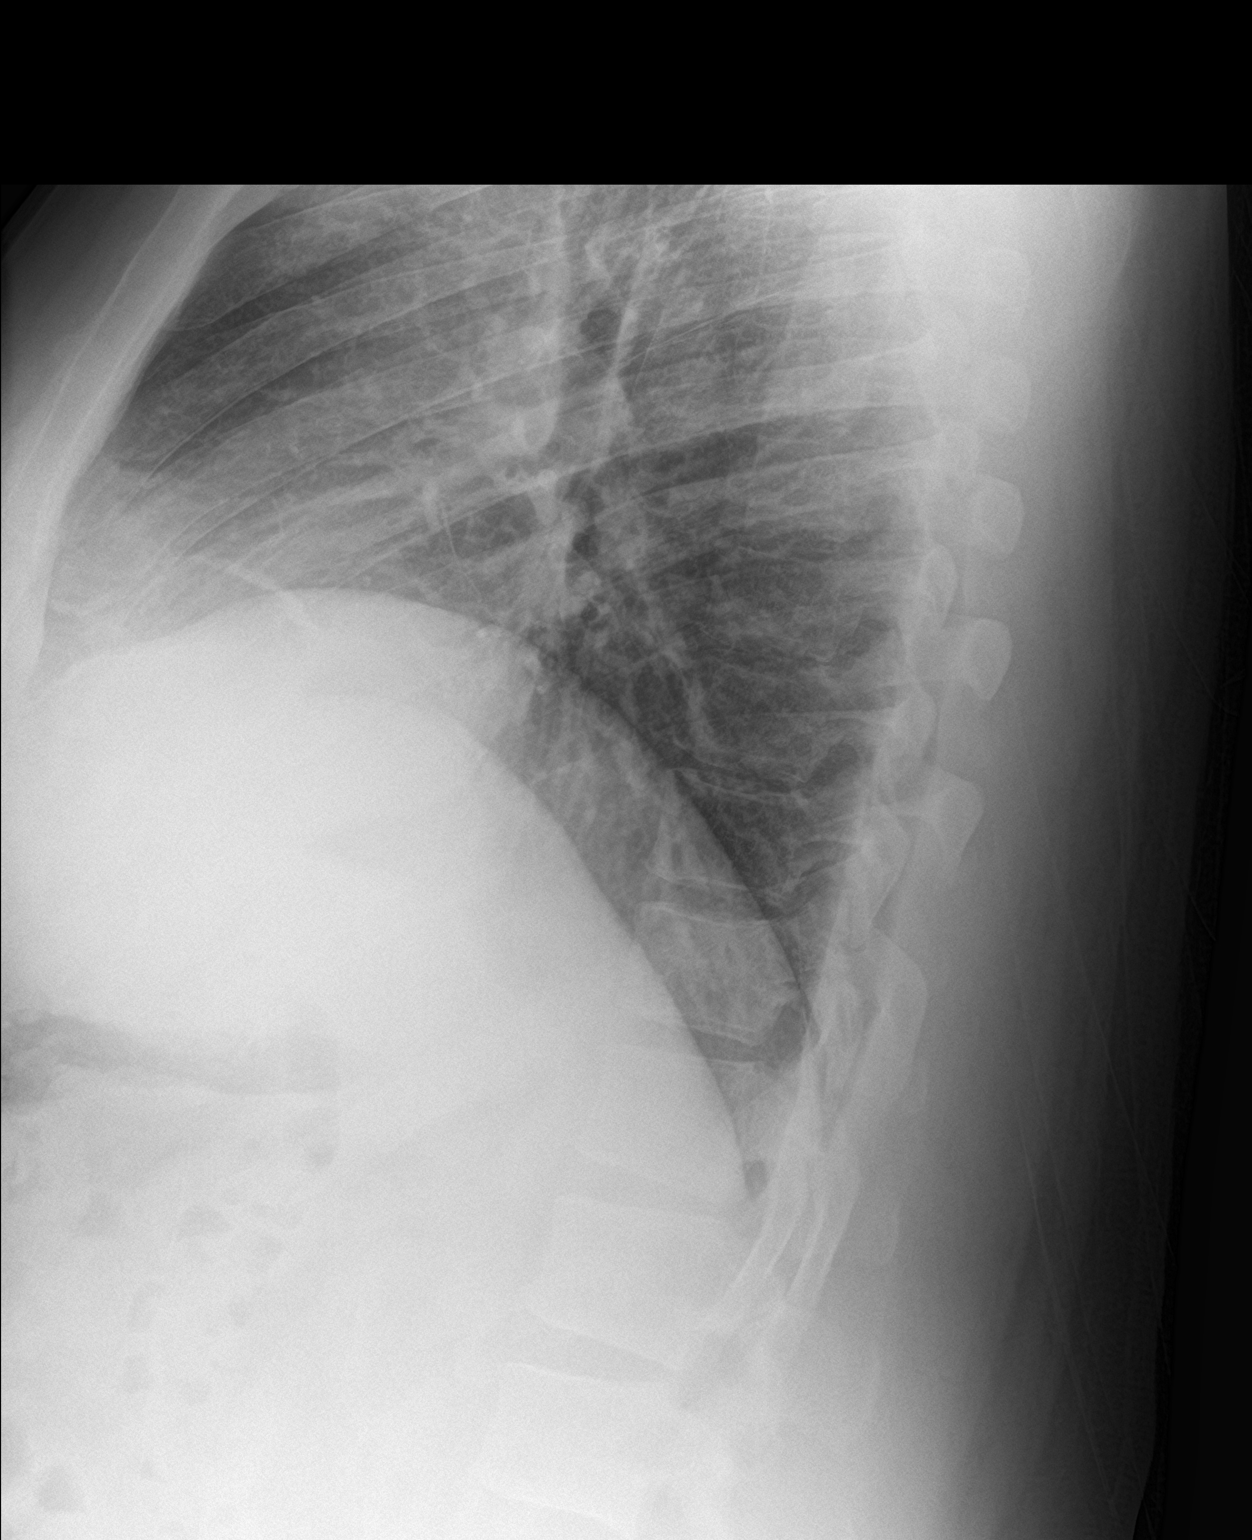

[2 of 2 positions shown; findings below may reference images not displayed]

FINDINGS: The lungs are reasonably well inflated. There is linear increased
density in the lingula. There is no alveolar infiltrate or pleural
effusion. The heart and pulmonary vascularity are normal. The
mediastinum is normal in width. The bony thorax is unremarkable.
IMPRESSION: Mildly increased lung markings in the lingula consistent with
subsegmental atelectasis or early interstitial pneumonia. Followup
PA and lateral chest X-ray is recommended in 3-4 weeks following
trial of antibiotic therapy to ensure resolution and exclude
underlying malignancy.

## 2018-04-22 MED FILL — ROSUVASTATIN CALCIUM 10 MG: 10 | 90 days supply | Qty: 90 | Fill #1

## 2018-04-29 ENCOUNTER — Other Ambulatory Visit (INDEPENDENT_AMBULATORY_CARE_PROVIDER_SITE_OTHER): Payer: BC Managed Care – PPO

## 2018-04-29 ENCOUNTER — Encounter: Payer: Self-pay | Admitting: Internal Medicine

## 2018-04-29 ENCOUNTER — Ambulatory Visit (INDEPENDENT_AMBULATORY_CARE_PROVIDER_SITE_OTHER): Payer: BC Managed Care – PPO | Admitting: Internal Medicine

## 2018-04-29 ENCOUNTER — Telehealth: Payer: Self-pay | Admitting: Internal Medicine

## 2018-04-29 VITALS — BP 136/80 | HR 80 | Temp 97.9°F | Resp 16 | Ht 77.0 in | Wt 330.0 lb

## 2018-04-29 DIAGNOSIS — E785 Hyperlipidemia, unspecified: Secondary | ICD-10-CM | POA: Diagnosis not present

## 2018-04-29 DIAGNOSIS — E1169 Type 2 diabetes mellitus with other specified complication: Secondary | ICD-10-CM

## 2018-04-29 DIAGNOSIS — I10 Essential (primary) hypertension: Secondary | ICD-10-CM | POA: Diagnosis not present

## 2018-04-29 DIAGNOSIS — E669 Obesity, unspecified: Secondary | ICD-10-CM

## 2018-04-29 LAB — LIPID PANEL
CHOL/HDL RATIO: 4
Cholesterol: 163 mg/dL (ref 0–200)
HDL: 37.7 mg/dL — AB (ref >39.00)
LDL Cholesterol: 96 mg/dL (ref 0–99)
NonHDL: 125.11
Triglycerides: 147 mg/dL (ref 0.0–149.0)
VLDL: 29.4 mg/dL (ref 0.0–40.0)

## 2018-04-29 LAB — BASIC METABOLIC PANEL
BUN: 12 mg/dL (ref 6–23)
CALCIUM: 9.9 mg/dL (ref 8.4–10.5)
CHLORIDE: 100 meq/L (ref 96–112)
CO2: 25 mEq/L (ref 19–32)
CREATININE: 1.02 mg/dL (ref 0.40–1.50)
GFR: 106.26 mL/min (ref >60.00)
Glucose, Bld: 230 mg/dL — ABNORMAL HIGH (ref 70–99)
Potassium: 4.1 mEq/L (ref 3.5–5.1)
SODIUM: 136 meq/L (ref 135–145)

## 2018-04-29 LAB — POCT GLYCOSYLATED HEMOGLOBIN (HGB A1C): HEMOGLOBIN A1C: 10.8 % — AB (ref 4.0–5.6)

## 2018-04-29 MED ORDER — INSULIN GLARGINE-LIXISENATIDE 100-33 UNT-MCG/ML ~~LOC~~ SOPN: 60.0000 [IU] | PEN_INJECTOR | Freq: Every day | SUBCUTANEOUS | 3 refills | Status: DC

## 2018-04-29 NOTE — Progress Notes (Signed)
Subjective:  Patient ID: Stephen Hall, male    DOB: Feb 12, 1981  Age: 37 y.o. MRN: [id number]  CC: Hypertension; Hyperlipidemia; and Diabetes   HPI [name] presents for f/up - He is not using the GLP-1 P1 agonist because he decided it was just too much medicine.  He does not think his blood sugars are well controlled because he experiences poly's.  He is taking the combination of metformin and SGLT2 inhibitor.  He is tolerating this well with no abdominal pain, cramps, or diarrhea.  Outpatient Medications Prior to Visit  Medication Sig Dispense Refill  . Blood Glucose Monitoring Suppl (ONETOUCH VERIO FLEX SYSTEM) w/Device KIT 1 Act by Does not apply route 3 (three) times daily. 2 kit 0  . Candesartan Cilexetil-HCTZ 32-25 MG TABS Take 1 tablet by mouth daily. 90 each 1  . Cholecalciferol (VITAMIN D3) 50000 units TABS Take 1 tablet by mouth once a week. 12 tablet 1  . Dapagliflozin-metFORMIN HCl ER (XIGDUO XR) 10-998 MG TB24 Take 2 tablets by mouth daily. Must keep schedule appt for future refills 180 tablet 1  . glucose blood (ONETOUCH VERIO) test strip Use TID 100 each 0  . Insulin Pen Needle (NOVOFINE) 32G X 6 MM MISC 2 Act by Does not apply route daily. 200 each 3  . ONETOUCH DELICA LANCETS 93Z MISC USE TO HELP CHECK BLOOD SUGARS 3 TIMES A DAY 100 each 0  . rosuvastatin (CRESTOR) 10 MG tablet Take 1 tablet (10 mg total) by mouth daily. 90 tablet 1  . Insulin Glargine (TOUJEO MAX SOLOSTAR) 300 UNIT/ML SOPN Inject 30 Units into the skin daily. 9 mL 1  . Azilsartan-Chlorthalidone (EDARBYCLOR) 40-25 MG TABS Take 1 tablet by mouth daily. 90 tablet 1  . Lancets MISC Use to check blood sugar twice daily. 100 each 2  . Liraglutide -Weight Management (SAXENDA) 18 MG/3ML SOPN Inject 3 mg into the skin daily. 5 pen 5  . ONETOUCH VERIO test strip USE TO TEST 3 TIMES A DAY 300 each 3   No facility-administered medications prior to visit.     ROS Review of Systems  Constitutional:  Negative for diaphoresis and fatigue.  HENT: Negative.  Negative for trouble swallowing.   Eyes: Negative for visual disturbance.  Respiratory: Negative.  Negative for cough, chest tightness, shortness of breath and wheezing.   Cardiovascular: Negative for chest pain, palpitations and leg swelling.  Gastrointestinal: Negative for abdominal pain, diarrhea, nausea and vomiting.  Endocrine: Positive for polydipsia, polyphagia and polyuria.  Genitourinary: Negative.  Negative for difficulty urinating and dysuria.  Musculoskeletal: Negative.  Negative for arthralgias and myalgias.  Skin: Negative.   Neurological: Negative for dizziness, weakness and light-headedness.  Hematological: Negative for adenopathy. Does not bruise/bleed easily.  Psychiatric/Behavioral: Negative.     Objective:  BP 136/80 (BP Location: Left Arm, Patient Position: Sitting, Cuff Size: Large)   Pulse 80   Temp 97.9 F (36.6 C) (Oral)   Resp 16   Ht '6\' 5"'  (1.956 m)   Wt (!) 330 lb (149.7 kg)   SpO2 98%   BMI 39.13 kg/m   BP Readings from Last 3 Encounters:  04/29/18 136/80  12/18/17 140/90  03/01/17 122/80    Wt Readings from Last 3 Encounters:  04/29/18 (!) 330 lb (149.7 kg)  12/18/17 (!) 333 lb 8 oz (151.3 kg)  03/01/17 (!) 332 lb 12.8 oz (151 kg)    Physical Exam  Constitutional: He is oriented to person, place, and time. No distress.  HENT:  Mouth/Throat: Oropharynx is clear and moist. No oropharyngeal exudate.  Eyes: Conjunctivae are normal. No scleral icterus.  Neck: Normal range of motion. Neck supple. No JVD present. No thyromegaly present.  Cardiovascular: Normal rate, regular rhythm and normal heart sounds. Exam reveals no gallop.  No murmur heard. Pulmonary/Chest: Effort normal and breath sounds normal. No respiratory distress. He has no wheezes. He has no rales.  Abdominal: Soft. Normal appearance and bowel sounds are normal. He exhibits no mass. There is no hepatosplenomegaly. There is no  tenderness.  Musculoskeletal: Normal range of motion. He exhibits no edema, tenderness or deformity.  Lymphadenopathy:    He has no cervical adenopathy.  Neurological: He is alert and oriented to person, place, and time.  Skin: He is not diaphoretic.  Vitals reviewed.   Lab Results  Component Value Date   WBC 7.0 12/18/2017   HGB 15.2 12/18/2017   HCT 46.5 12/18/2017   PLT 258.0 12/18/2017   GLUCOSE 230 (H) 04/29/2018   CHOL 163 04/29/2018   TRIG 147.0 04/29/2018   HDL 37.70 (L) 04/29/2018   LDLDIRECT 158.1 02/07/2012   LDLCALC 96 04/29/2018   ALT 18 12/18/2017   AST 11 12/18/2017   NA 136 04/29/2018   K 4.1 04/29/2018   CL 100 04/29/2018   CREATININE 1.02 04/29/2018   BUN 12 04/29/2018   CO2 25 04/29/2018   TSH 2.05 12/18/2017   HGBA1C 10.8 (A) 04/29/2018   MICROALBUR 3.6 (H) 12/18/2017    No results found.  Assessment & Plan:   Odas was seen today for hypertension, hyperlipidemia and diabetes.  Diagnoses and all orders for this visit:  Diabetes mellitus type 2 in obese ([place])- His A1c is at 10.8%.  His blood sugars are not adequately well controlled.  I will consolidate the basal insulin with a GLP-1 agonist so that he will not feel like he is taking so much medication.  Will continue the metformin and the SGLT2 inhibitor at the current doses. -     POCT glycosylated hemoglobin (Hb A1C) -     Discontinue: Insulin Glargine-Lixisenatide (SOLIQUA) 100-33 UNT-MCG/ML SOPN; Inject 60 Units into the skin daily. -     Basic metabolic panel; Future  Essential hypertension- His blood pressure is well controlled.  Electrolytes and renal function are normal. -     Basic metabolic panel; Future  Hyperlipidemia with target LDL less than 130- He has achieved his LDL goal and is doing well on the statin. -     Lipid panel; Future   I have discontinued Zakariyya Ceja's Lancets, Liraglutide -Weight Management, Insulin Glargine, Azilsartan-Chlorthalidone, and ONETOUCH VERIO. I  am also having him maintain his ONETOUCH VERIO FLEX SYSTEM, Dapagliflozin-metFORMIN HCl ER, Insulin Pen Needle, Vitamin D3, rosuvastatin, Candesartan Cilexetil-HCTZ, ONETOUCH DELICA LANCETS 46F, and glucose blood.  Meds ordered this encounter  Medications  . DISCONTD: Insulin Glargine-Lixisenatide (SOLIQUA) 100-33 UNT-MCG/ML SOPN    Sig: Inject 60 Units into the skin daily.    Dispense:  9 mL    Refill:  3     Follow-up: Return in about 4 months (around 08/30/2018).  Scarlette Calico, MD

## 2018-04-29 NOTE — Telephone Encounter (Signed)
Copied from CRM [id number]. Topic: General - Other >> Apr 29, 2018 12:53 PM Laural Benes, [place] C wrote: Reason for CRM: pharmacy called in to confirm Rx quantity. She would like to be sure that the provider intended to provide 15 day supply of Insulin Glargine-Lixisenatide (SOLIQUA) 100-33 UNT-MCG/ML SOPN?   Please advise.   CB: [phone]

## 2018-04-29 NOTE — Telephone Encounter (Signed)
Called [facility] and confirmed quantity needed for 30 day. Resent rx for 18ml and 3 refills.

## 2018-04-29 NOTE — Patient Instructions (Signed)

## 2018-05-14 MED FILL — SOLIQUA 100 UNIT-33 MCG/ML: 100-33 | 25 days supply | Qty: 15 | Fill #0

## 2018-05-21 LAB — HM DIABETES EYE EXAM

## 2018-06-28 ENCOUNTER — Ambulatory Visit: Payer: BC Managed Care – PPO | Admitting: Primary Care

## 2018-06-28 VITALS — BP 134/82 | HR 93 | Temp 98.8°F | Wt 336.0 lb

## 2018-06-28 DIAGNOSIS — B9789 Other viral agents as the cause of diseases classified elsewhere: Secondary | ICD-10-CM

## 2018-06-28 DIAGNOSIS — J069 Acute upper respiratory infection, unspecified: Secondary | ICD-10-CM | POA: Diagnosis not present

## 2018-06-28 DIAGNOSIS — Z20818 Contact with and (suspected) exposure to other bacterial communicable diseases: Secondary | ICD-10-CM | POA: Diagnosis not present

## 2018-06-28 LAB — POCT RAPID STREP A (OFFICE): RAPID STREP A SCREEN: NEGATIVE

## 2018-06-28 NOTE — Progress Notes (Signed)
Subjective:    Patient ID: Jsoeph Podesta, male    DOB: 1980/08/28, 37 y.o.   MRN: [id number]  HPI  Mr. Miceli is a 37 year old male with a history of hypertension, OSA, asthma in remission, type 2 diabetes who presents today with a chief complaint of cough.  He also reports chills. His symptoms began 2 days ago. He denies fevers. His son was diagnosed and treated for strep throat with antibiotics on 06/24/18. He's taken Nyquil and Theraflu, cephachol drops with some improvement.   Review of Systems  Constitutional: Positive for chills. Negative for fever.  HENT: Positive for sore throat. Negative for postnasal drip and sinus pressure.   Respiratory: Positive for cough. Negative for shortness of breath.        Past Medical History:  Diagnosis Date  . Asthma   . Diabetes mellitus without complication ([place])   . Environmental allergies      Social History   Socioeconomic History  . Marital status: Married    Spouse name: Not on file  . Number of children: Not on file  . Years of education: Not on file  . Highest education level: Not on file  Occupational History  . Not on file  Social Needs  . Financial resource strain: Not on file  . Food insecurity:    Worry: Not on file    Inability: Not on file  . Transportation needs:    Medical: Not on file    Non-medical: Not on file  Tobacco Use  . Smoking status: Never Smoker  . Smokeless tobacco: Never Used  Substance and Sexual Activity  . Alcohol use: No  . Drug use: No  . Sexual activity: Yes  Lifestyle  . Physical activity:    Days per week: Not on file    Minutes per session: Not on file  . Stress: Not on file  Relationships  . Social connections:    Talks on phone: Not on file    Gets together: Not on file    Attends religious service: Not on file    Active member of club or organization: Not on file    Attends meetings of clubs or organizations: Not on file    Relationship status: Not on file  . Intimate  partner violence:    Fear of current or ex partner: Not on file    Emotionally abused: Not on file    Physically abused: Not on file    Forced sexual activity: Not on file  Other Topics Concern  . Not on file  Social History Narrative   Caffienated drinks-yes   Seat belt use often-yes   Regular Exercise-no   Smoke alarm in the home-yes   Firearms/guns in the home-no   History of physical abuse-no                No past surgical history on file.  Family History  Problem Relation Age of Onset  . Alcohol abuse Other   . Arthritis Other   . Hypertension Other   . Diabetes Other   . Obesity Mother   . Hypertension Mother   . Obesity Father   . Hypertension Father   . Cancer Neg Hx   . Heart disease Neg Hx   . Stroke Neg Hx   . Kidney disease Neg Hx     No Known Allergies  Current Outpatient Medications on File Prior to Visit  Medication Sig Dispense Refill  . Blood Glucose Monitoring Suppl (  [place]) w/Device KIT 1 Act by Does not apply route 3 (three) times daily. 2 kit 0  . Candesartan Cilexetil-HCTZ 32-25 MG TABS Take 1 tablet by mouth daily. 90 each 1  . Cholecalciferol (VITAMIN D3) 50000 units TABS Take 1 tablet by mouth once a week. 12 tablet 1  . Dapagliflozin-metFORMIN HCl ER (XIGDUO XR) 10-998 MG TB24 Take 2 tablets by mouth daily. Must keep schedule appt for future refills 180 tablet 1  . glucose blood (ONETOUCH VERIO) test strip Use TID 100 each 0  . Insulin Glargine-Lixisenatide (SOLIQUA) 100-33 UNT-MCG/ML SOPN Inject 60 Units into the skin daily. 18 mL 3  . Insulin Pen Needle (NOVOFINE) 32G X 6 MM MISC 2 Act by Does not apply route daily. 200 each 3  . ONETOUCH DELICA LANCETS 07E MISC USE TO HELP CHECK BLOOD SUGARS 3 TIMES A DAY 100 each 0  . rosuvastatin (CRESTOR) 10 MG tablet Take 1 tablet (10 mg total) by mouth daily. 90 tablet 1   No current facility-administered medications on file prior to visit.     BP 134/82 (BP Location: Right  Arm, Patient Position: Sitting, Cuff Size: Normal)   Pulse 93   Temp 98.8 F (37.1 C) (Oral)   Wt (!) 336 lb (152.4 kg)   SpO2 97%   BMI 39.84 kg/m    Objective:   Physical Exam  Constitutional: He appears well-nourished. He does not appear ill.  HENT:  Right Ear: Tympanic membrane and ear canal normal.  Left Ear: Tympanic membrane and ear canal normal.  Nose: No mucosal edema. Right sinus exhibits no maxillary sinus tenderness and no frontal sinus tenderness. Left sinus exhibits no maxillary sinus tenderness and no frontal sinus tenderness.  Mouth/Throat: Oropharynx is clear and moist.  Neck: Neck supple.  Cardiovascular: Normal rate and regular rhythm.  Respiratory: Effort normal and breath sounds normal. He has no wheezes.  Skin: Skin is warm and dry.           Assessment & Plan:  Sore Throat/Viral URI:  Cough, chills, sore throat x 2 days, son diagnosed and treated for strep. Exam today without evidence for bacterial involvement. Rapid strep negative. Suspect viral URI and will treat with conservative measures. Discussed Zyrtec, Flonase, Delsym, fluids, rest. Return precautions provided.  Pleas Koch, NP

## 2018-06-28 NOTE — Patient Instructions (Signed)
Most of the time these symptoms are caused by a viral infection which will resolve on its own over time. You should be feeling better by day seven of symptoms.  You can try a few things over the counter to help with your symptoms including:  Cough: Delsym or Robitussin (get the off brand, works just as well) Chest Congestion: Mucinex (plain) Nasal Congestion/Ear Pressure/Sinus Pressure: Try using Flonase (fluticasone) nasal spray. Instill 1 spray in each nostril twice daily. This can be purchased over the counter. Ibuprofen or Tylenol: Body aches, fevers, headache.  Please schedule an appointment if you are not improving after one full week of symptoms, start running fevers consistently over 101, experience shortness of breath with or without wheezing.  It was a pleasure meeting you!  

## 2018-07-01 ENCOUNTER — Other Ambulatory Visit: Payer: Self-pay | Admitting: Internal Medicine

## 2018-07-01 DIAGNOSIS — E559 Vitamin D deficiency, unspecified: Secondary | ICD-10-CM

## 2018-07-03 MED FILL — VITAMIN D3 50000 UNIT CAPS: 1.25 MG | 84 days supply | Qty: 12 | Fill #0

## 2018-08-11 ENCOUNTER — Other Ambulatory Visit: Payer: Self-pay | Admitting: Internal Medicine

## 2018-08-11 DIAGNOSIS — E785 Hyperlipidemia, unspecified: Secondary | ICD-10-CM

## 2018-08-11 DIAGNOSIS — E669 Obesity, unspecified: Secondary | ICD-10-CM

## 2018-08-11 DIAGNOSIS — E1169 Type 2 diabetes mellitus with other specified complication: Secondary | ICD-10-CM

## 2018-08-11 MED FILL — SOLIQUA 100 UNIT-33 MCG/ML: 100-33 | 25 days supply | Qty: 15 | Fill #1

## 2018-08-11 MED FILL — ROSUVASTATIN CALCIUM 10 MG: 10 | 90 days supply | Qty: 90 | Fill #0

## 2018-08-27 ENCOUNTER — Ambulatory Visit: Payer: BC Managed Care – PPO | Admitting: Internal Medicine

## 2018-09-19 MED FILL — SOLIQUA 100 UNIT-33 MCG/ML: 100-33 | 25 days supply | Qty: 15 | Fill #2

## 2018-09-23 ENCOUNTER — Telehealth: Payer: Self-pay

## 2018-09-23 ENCOUNTER — Other Ambulatory Visit: Payer: Self-pay | Admitting: Internal Medicine

## 2018-09-23 DIAGNOSIS — E559 Vitamin D deficiency, unspecified: Secondary | ICD-10-CM

## 2018-09-23 MED ORDER — CHOLECALCIFEROL 50 MCG (2000 UT) PO TABS: 1.0000 | ORAL_TABLET | Freq: Every day | ORAL | 0 refills | Status: DC

## 2018-09-23 NOTE — Telephone Encounter (Signed)
Pt has rescheduled his appt to May.   Pt wanted to know if you wanted him to continue the vitamin d. If so, he needs a refill.

## 2018-09-23 NOTE — Telephone Encounter (Signed)
I do want him to stay on a vitamin D supplement but he does not have to continue taking the high-dose if he has already done this for 3 months.  I recommend that he lower the vitamin D dose to 2000 international units once a day with a small glass of whole milk. I have sent this prescription to the [facility] but this is an over-the-counter supplement that he can pick up at any pharmacy or grocery store.

## 2018-09-24 ENCOUNTER — Ambulatory Visit: Payer: BC Managed Care – PPO | Admitting: Internal Medicine

## 2018-09-24 NOTE — Telephone Encounter (Signed)
Pt informed of PCP recommendation and stated understanding.

## 2018-11-13 ENCOUNTER — Other Ambulatory Visit: Payer: Self-pay | Admitting: Internal Medicine

## 2018-11-13 DIAGNOSIS — E669 Obesity, unspecified: Secondary | ICD-10-CM

## 2018-11-13 DIAGNOSIS — E1169 Type 2 diabetes mellitus with other specified complication: Secondary | ICD-10-CM

## 2018-11-13 DIAGNOSIS — I10 Essential (primary) hypertension: Secondary | ICD-10-CM

## 2018-11-13 MED FILL — CANDESARTAN-HCTZ 32-25 MG T: 32-25 | 30 days supply | Qty: 30 | Fill #0

## 2018-11-26 ENCOUNTER — Encounter: Payer: Self-pay | Admitting: Internal Medicine

## 2018-11-26 ENCOUNTER — Ambulatory Visit (INDEPENDENT_AMBULATORY_CARE_PROVIDER_SITE_OTHER): Payer: BC Managed Care – PPO | Admitting: Internal Medicine

## 2018-11-26 ENCOUNTER — Other Ambulatory Visit (INDEPENDENT_AMBULATORY_CARE_PROVIDER_SITE_OTHER): Payer: BC Managed Care – PPO

## 2018-11-26 ENCOUNTER — Other Ambulatory Visit: Payer: Self-pay

## 2018-11-26 VITALS — BP 132/88 | HR 89 | Temp 98.5°F | Ht 77.0 in | Wt 337.0 lb

## 2018-11-26 DIAGNOSIS — E559 Vitamin D deficiency, unspecified: Secondary | ICD-10-CM | POA: Diagnosis not present

## 2018-11-26 DIAGNOSIS — E669 Obesity, unspecified: Secondary | ICD-10-CM

## 2018-11-26 DIAGNOSIS — I10 Essential (primary) hypertension: Secondary | ICD-10-CM | POA: Diagnosis not present

## 2018-11-26 DIAGNOSIS — E1169 Type 2 diabetes mellitus with other specified complication: Secondary | ICD-10-CM

## 2018-11-26 LAB — BASIC METABOLIC PANEL
BUN: 13 mg/dL (ref 6–23)
CO2: 21 mEq/L (ref 19–32)
Calcium: 9.4 mg/dL (ref 8.4–10.5)
Chloride: 100 mEq/L (ref 96–112)
Creatinine, Ser: 0.95 mg/dL (ref 0.40–1.50)
GFR: 108.17 mL/min (ref >60.00)
Glucose, Bld: 214 mg/dL — ABNORMAL HIGH (ref 70–99)
Potassium: 3.8 mEq/L (ref 3.5–5.1)
Sodium: 134 mEq/L — ABNORMAL LOW (ref 135–145)

## 2018-11-26 LAB — MICROALBUMIN / CREATININE URINE RATIO
Creatinine,U: 116.9 mg/dL
Microalb Creat Ratio: 1.5 mg/g (ref 0.0–30.0)
Microalb, Ur: 1.7 mg/dL (ref 0.0–1.9)

## 2018-11-26 LAB — VITAMIN D 25 HYDROXY (VIT D DEFICIENCY, FRACTURES): VITD: 36.47 ng/mL (ref 30.00–100.00)

## 2018-11-26 LAB — HEMOGLOBIN A1C: Hgb A1c MFr Bld: 13.6 % — ABNORMAL HIGH (ref 4.6–6.5)

## 2018-11-26 MED ORDER — INSULIN GLARGINE-LIXISENATIDE 100-33 UNT-MCG/ML ~~LOC~~ SOPN: 60.0000 [IU] | PEN_INJECTOR | Freq: Every day | SUBCUTANEOUS | 1 refills | Status: DC

## 2018-11-26 MED ORDER — DAPAGLIFLOZIN PRO-METFORMIN ER 5-1000 MG PO TB24: 2.0000 | ORAL_TABLET | Freq: Every day | ORAL | 1 refills | Status: DC

## 2018-11-26 NOTE — Patient Instructions (Signed)
Type 2 Diabetes Mellitus, Diagnosis, Adult Type 2 diabetes (type 2 diabetes mellitus) is a long-term (chronic) disease. In type 2 diabetes, one or both of these problems may be present:  The pancreas does not make enough of a hormone called insulin.  Cells in the body do not respond properly to insulin that the body makes (insulin resistance). Normally, insulin allows blood sugar (glucose) to enter cells in the body. The cells use glucose for energy. Insulin resistance or lack of insulin causes excess glucose to build up in the blood instead of going into cells. As a result, high blood glucose (hyperglycemia) develops. What increases the risk? The following factors may make you more likely to develop type 2 diabetes:  Having a family member with type 2 diabetes.  Being overweight or obese.  Having an inactive (sedentary) lifestyle.  Having been diagnosed with insulin resistance.  Having a history of prediabetes, gestational diabetes, or polycystic ovary syndrome (PCOS).  Being of American-Indian, African-American, Hispanic/Latino, or Asian/Pacific Islander descent. What are the signs or symptoms? In the early stage of this condition, you may not have symptoms. Symptoms develop slowly and may include:  Increased thirst (polydipsia).  Increased hunger(polyphagia).  Increased urination (polyuria).  Increased urination during the night (nocturia).  Unexplained weight loss.  Frequent infections that keep coming back (recurring).  Fatigue.  Weakness.  Vision changes, such as blurry vision.  Cuts or bruises that are slow to heal.  Tingling or numbness in the hands or feet.  Dark patches on the skin (acanthosis nigricans). How is this diagnosed? This condition is diagnosed based on your symptoms, your medical history, a physical exam, and your blood glucose level. Your blood glucose may be checked with one or more of the following blood tests:  A fasting blood glucose (FBG)  test. You will not be allowed to eat (you will fast) for 8 hours or longer before a blood sample is taken.  A random blood glucose test. This test checks blood glucose at any time of day regardless of when you ate.  An A1c (hemoglobin A1c) blood test. This test provides information about blood glucose control over the previous 2-3 months.  An oral glucose tolerance test (OGTT). This test measures your blood glucose at two times: ? After fasting. This is your baseline blood glucose level. ? Two hours after drinking a beverage that contains glucose. You may be diagnosed with type 2 diabetes if:  Your FBG level is 126 mg/dL (7.0 mmol/L) or higher.  Your random blood glucose level is 200 mg/dL (11.1 mmol/L) or higher.  Your A1c level is 6.5% or higher.  Your OGTT result is higher than 200 mg/dL (11.1 mmol/L). These blood tests may be repeated to confirm your diagnosis. How is this treated? Your treatment may be managed by a specialist called an endocrinologist. Type 2 diabetes may be treated by following instructions from your health care provider about:  Making diet and lifestyle changes. This may include: ? Following an individualized nutrition plan that is developed by a diet and nutrition specialist (registered dietitian). ? Exercising regularly. ? Finding ways to manage stress.  Checking your blood glucose level as often as told.  Taking diabetes medicines or insulin daily. This helps to keep your blood glucose levels in the healthy range. ? If you use insulin, you may need to adjust the dosage depending on how physically active you are and what foods you eat. Your health care provider will tell you how to adjust your dosage.    Taking medicines to help prevent complications from diabetes, such as: ? Aspirin. ? Medicine to lower cholesterol. ? Medicine to control blood pressure. Your health care provider will set individualized treatment goals for you. Your goals will be based on  your age, other medical conditions you have, and how you respond to diabetes treatment. Generally, the goal of treatment is to maintain the following blood glucose levels:  Before meals (preprandial): 80-130 mg/dL (4.4-7.2 mmol/L).  After meals (postprandial): below 180 mg/dL (10 mmol/L).  A1c level: less than 7%. Follow these instructions at home: Questions to ask your health care provider  Consider asking the following questions: ? Do I need to meet with a diabetes educator? ? Where can I find a support group for people with diabetes? ? What equipment will I need to manage my diabetes at home? ? What diabetes medicines do I need, and when should I take them? ? How often do I need to check my blood glucose? ? What number can I call if I have questions? ? When is my next appointment? General instructions  Take over-the-counter and prescription medicines only as told by your health care provider.  Keep all follow-up visits as told by your health care provider. This is important.  For more information about diabetes, visit: ? American Diabetes Association (ADA): www.diabetes.org ? American Association of Diabetes Educators (AADE): www.diabeteseducator.org Contact a health care provider if:  Your blood glucose is at or above 240 mg/dL (13.3 mmol/L) for 2 days in a row.  You have been sick or have had a fever for 2 days or longer, and you are not getting better.  You have any of the following problems for more than 6 hours: ? You cannot eat or drink. ? You have nausea and vomiting. ? You have diarrhea. Get help right away if:  Your blood glucose is lower than 54 mg/dL (3.0 mmol/L).  You become confused or you have trouble thinking clearly.  You have difficulty breathing.  You have moderate or large ketone levels in your urine. Summary  Type 2 diabetes (type 2 diabetes mellitus) is a long-term (chronic) disease. In type 2 diabetes, the pancreas does not make enough of a  hormone called insulin, or cells in the body do not respond properly to insulin that the body makes (insulin resistance).  This condition is treated by making diet and lifestyle changes and taking diabetes medicines or insulin.  Your health care provider will set individualized treatment goals for you. Your goals will be based on your age, other medical conditions you have, and how you respond to diabetes treatment.  Keep all follow-up visits as told by your health care provider. This is important. This information is not intended to replace advice given to you by your health care provider. Make sure you discuss any questions you have with your health care provider. Document Released: 06/18/2005 Document Revised: 01/17/2017 Document Reviewed: 07/22/2015 Elsevier Interactive Patient Education  2019 Elsevier Inc.  

## 2018-11-26 NOTE — Progress Notes (Signed)
Subjective:  Patient ID: Stephen Hall, male    DOB: [birth year]  Age: 38 y.o. MRN: [id number]  CC: Hypertension and Diabetes   HPI Ayiden Milliman presents for f/up - He does not monitor his blood sugar.  He complains of polys.  According to his prescription refills he would no longer be taking the metformin/SGLT2 inhibitor combination.  Outpatient Medications Prior to Visit  Medication Sig Dispense Refill  . Blood Glucose Monitoring Suppl (ONETOUCH VERIO FLEX SYSTEM) w/Device KIT 1 Act by Does not apply route 3 (three) times daily. 2 kit 0  . Candesartan Cilexetil-HCTZ 32-25 MG TABS Take 1 tablet by mouth daily. Overdue for follow-up appt must see provider for refills 30 each 0  . glucose blood (ONETOUCH VERIO) test strip Use TID 100 each 0  . Insulin Pen Needle (NOVOFINE) 32G X 6 MM MISC 2 Act by Does not apply route daily. 200 each 3  . ONETOUCH DELICA LANCETS 29U MISC USE TO HELP CHECK BLOOD SUGARS 3 TIMES A DAY 100 each 0  . rosuvastatin (CRESTOR) 10 MG tablet TAKE 1 TABLET (10 MG TOTAL) BY MOUTH DAILY. 90 tablet 0  . Dapagliflozin-metFORMIN HCl ER (XIGDUO XR) 10-998 MG TB24 Take 2 tablets by mouth daily. Must keep schedule appt for future refills 180 tablet 1  . Insulin Glargine-Lixisenatide (SOLIQUA) 100-33 UNT-MCG/ML SOPN Inject 60 Units into the skin daily. 18 mL 3  . Cholecalciferol 50 MCG (2000 UT) TABS Take 1 tablet (2,000 Units total) by mouth daily. (Patient not taking: Reported on 11/26/2018) 90 tablet 0   No facility-administered medications prior to visit.     ROS Review of Systems  Constitutional: Positive for unexpected weight change (wt gain). Negative for chills, diaphoresis and fatigue.  HENT: Negative.   Eyes: Negative for visual disturbance.  Respiratory: Negative for cough, chest tightness, shortness of breath and wheezing.   Cardiovascular: Negative for chest pain, palpitations and leg swelling.  Gastrointestinal: Negative for abdominal pain, constipation,  diarrhea, nausea and vomiting.  Endocrine: Positive for polydipsia, polyphagia and polyuria.  Genitourinary: Positive for frequency. Negative for difficulty urinating, dysuria and urgency.  Musculoskeletal: Negative.  Negative for arthralgias and myalgias.  Skin: Negative.  Negative for color change.  Neurological: Negative.  Negative for dizziness, weakness, light-headedness and numbness.  Hematological: Negative for adenopathy. Does not bruise/bleed easily.  Psychiatric/Behavioral: Negative.     Objective:  BP 132/88 (BP Location: Left Arm, Patient Position: Sitting, Cuff Size: Large)   Pulse 89   Temp 98.5 F (36.9 C) (Oral)   Ht '6\' 5"'  (1.956 m)   Wt (!) 337 lb (152.9 kg)   SpO2 97%   BMI 39.96 kg/m   BP Readings from Last 3 Encounters:  11/26/18 132/88  06/28/18 134/82  04/29/18 136/80    Wt Readings from Last 3 Encounters:  11/26/18 (!) 337 lb (152.9 kg)  06/28/18 (!) 336 lb (152.4 kg)  04/29/18 (!) 330 lb (149.7 kg)    Physical Exam Constitutional:      Appearance: He is obese. He is not ill-appearing or diaphoretic.  HENT:     Nose: Nose normal. No congestion or rhinorrhea.     Mouth/Throat:     Mouth: Mucous membranes are moist.     Pharynx: No oropharyngeal exudate.  Eyes:     General: No scleral icterus.    Conjunctiva/sclera: Conjunctivae normal.  Neck:     Musculoskeletal: Normal range of motion and neck supple. No neck rigidity.  Cardiovascular:     Rate  and Rhythm: Normal rate and regular rhythm.     Pulses: Normal pulses.     Heart sounds: No murmur.  Pulmonary:     Effort: Pulmonary effort is normal.     Breath sounds: No stridor. No wheezing, rhonchi or rales.  Abdominal:     General: Abdomen is protuberant. Bowel sounds are normal. There is no distension.     Palpations: Abdomen is soft. There is no hepatomegaly or splenomegaly.     Tenderness: There is no abdominal tenderness.     Hernia: No hernia is present.  Musculoskeletal: Normal range  of motion.     Right lower leg: No edema.     Left lower leg: No edema.  Lymphadenopathy:     Cervical: No cervical adenopathy.  Skin:    General: Skin is warm and dry.  Neurological:     General: No focal deficit present.  Psychiatric:        Mood and Affect: Mood normal.        Behavior: Behavior normal.     Lab Results  Component Value Date   WBC 7.0 12/18/2017   HGB 15.2 12/18/2017   HCT 46.5 12/18/2017   PLT 258.0 12/18/2017   GLUCOSE 214 (H) 11/26/2018   CHOL 163 04/29/2018   TRIG 147.0 04/29/2018   HDL 37.70 (L) 04/29/2018   LDLDIRECT 158.1 02/07/2012   LDLCALC 96 04/29/2018   ALT 18 12/18/2017   AST 11 12/18/2017   NA 134 (L) 11/26/2018   K 3.8 11/26/2018   CL 100 11/26/2018   CREATININE 0.95 11/26/2018   BUN 13 11/26/2018   CO2 21 11/26/2018   TSH 2.05 12/18/2017   HGBA1C 13.6 (H) 11/26/2018   MICROALBUR 1.7 11/26/2018    No results found.  Assessment & Plan:   Keondre was seen today for hypertension and diabetes.  Diagnoses and all orders for this visit:  Essential hypertension- His blood pressure is adequately well controlled. -     Basic metabolic panel; Future  Vitamin D deficiency disease- His vitamin D level is normal now. -     VITAMIN D 25 Hydroxy (Vit-D Deficiency, Fractures); Future  Diabetes mellitus type 2 in obese ([place])- His A1c is up to 13.6%.  He is not compliant with lifestyle modifications or his glycemic agents.  I have asked him to restart the GLP-1 agonist, basal insulin, SGLT2 inhibitor, and metformin.  I recommended that he undergo bariatric surgery but is not willing to do that.  He agrees to see endocrinology for ongoing evaluation and treatment of diabetes and to see a diabetic educater. -     Basic metabolic panel; Future -     Hemoglobin A1c; Future -     Microalbumin / creatinine urine ratio; Future -     HM Diabetes Foot Exam -     Ambulatory referral to Endocrinology -     Amb Referral to Nutrition and Diabetic E -      Consult to [place] Management -     Insulin Glargine-Lixisenatide (SOLIQUA) 100-33 UNT-MCG/ML SOPN; Inject 60 Units into the skin daily. -     Dapagliflozin-metFORMIN HCl ER (XIGDUO XR) 10-998 MG TB24; Take 2 tablets by mouth daily. Must keep schedule appt for future refills   I have discontinued Arsenio Sen's Cholecalciferol. I am also having him maintain his OneTouch Verio Flex System, Insulin Pen Needle, OneTouch Delica Lancets 42A, glucose blood, rosuvastatin, Candesartan Cilexetil-HCTZ, Insulin Glargine-Lixisenatide, and Dapagliflozin-metFORMIN HCl ER.  Meds ordered this encounter  Medications  . Insulin Glargine-Lixisenatide (SOLIQUA) 100-33 UNT-MCG/ML SOPN    Sig: Inject 60 Units into the skin daily.    Dispense:  18 mL    Refill:  1  . Dapagliflozin-metFORMIN HCl ER (XIGDUO XR) 10-998 MG TB24    Sig: Take 2 tablets by mouth daily. Must keep schedule appt for future refills    Dispense:  180 tablet    Refill:  1     Follow-up: Return in about 4 months (around 03/29/2019).  Scarlette Calico, MD

## 2019-01-05 ENCOUNTER — Other Ambulatory Visit: Payer: Self-pay | Admitting: Internal Medicine

## 2019-01-05 DIAGNOSIS — E1169 Type 2 diabetes mellitus with other specified complication: Secondary | ICD-10-CM

## 2019-01-05 DIAGNOSIS — I10 Essential (primary) hypertension: Secondary | ICD-10-CM

## 2019-01-05 DIAGNOSIS — E785 Hyperlipidemia, unspecified: Secondary | ICD-10-CM

## 2019-01-05 MED FILL — SOLIQUA 100 UNIT-33 MCG/ML: 100-33 | 30 days supply | Qty: 18 | Fill #0

## 2019-01-05 MED FILL — CANDESARTAN-HCTZ 32-25 MG T: 32-25 | 30 days supply | Qty: 30 | Fill #0

## 2019-01-05 MED FILL — ROSUVASTATIN CALCIUM 10 MG: 10 | 90 days supply | Qty: 90 | Fill #0

## 2019-01-05 MED FILL — XIGDUO XR 5 MG-1,000 MG TAB: 5-1000 | 90 days supply | Qty: 180 | Fill #0

## 2019-01-06 ENCOUNTER — Other Ambulatory Visit: Payer: Self-pay | Admitting: Internal Medicine

## 2019-01-06 DIAGNOSIS — E669 Obesity, unspecified: Secondary | ICD-10-CM

## 2019-01-06 DIAGNOSIS — E1169 Type 2 diabetes mellitus with other specified complication: Secondary | ICD-10-CM

## 2019-01-06 MED ORDER — NOVOFINE 32G X 6 MM MISC: 2.0000 | Freq: Every day | 1 refills | Status: DC

## 2019-01-06 MED FILL — BD PEN NEEDLE MICRO U/F 32G: 32G X 6 MM | 90 days supply | Qty: 100 | Fill #0

## 2019-01-07 MED FILL — BD PEN NEEDLE MICRO U/F 32G: 32G X 6 MM | 50 days supply | Qty: 100 | Fill #0

## 2019-01-13 ENCOUNTER — Other Ambulatory Visit: Payer: Self-pay | Admitting: Internal Medicine

## 2019-01-28 ENCOUNTER — Encounter: Payer: Self-pay | Admitting: Endocrinology

## 2019-01-28 ENCOUNTER — Ambulatory Visit: Payer: BC Managed Care – PPO | Admitting: Internal Medicine

## 2019-01-28 ENCOUNTER — Ambulatory Visit: Payer: BC Managed Care – PPO | Admitting: Endocrinology

## 2019-01-28 ENCOUNTER — Other Ambulatory Visit: Payer: Self-pay

## 2019-01-28 VITALS — BP 120/82 | HR 98 | Ht 77.0 in | Wt 329.0 lb

## 2019-01-28 DIAGNOSIS — E669 Obesity, unspecified: Secondary | ICD-10-CM | POA: Diagnosis not present

## 2019-01-28 DIAGNOSIS — E1169 Type 2 diabetes mellitus with other specified complication: Secondary | ICD-10-CM

## 2019-01-28 LAB — POCT GLYCOSYLATED HEMOGLOBIN (HGB A1C): Hemoglobin A1C: 13.6 % — AB (ref 4.0–5.6)

## 2019-01-28 MED ORDER — TRESIBA FLEXTOUCH 200 UNIT/ML ~~LOC~~ SOPN: 80.0000 [IU] | PEN_INJECTOR | Freq: Every day | SUBCUTANEOUS | 11 refills | Status: DC

## 2019-01-28 MED FILL — TRESIBA FLEXTOUCH 200 UNITS: 200 | 45 days supply | Qty: 18 | Fill #0

## 2019-01-28 NOTE — Patient Instructions (Addendum)
Please change the soliqua to tresiba, 80 units daily.  This is a slow time-release insulin, so if you miss it, you can make it up when you remember.   Please continue the same Xigduo.   check your blood sugar twice a day.  vary the time of day when you check, between before the 3 meals, and at bedtime.  also check if you have symptoms of your blood sugar being too high or too low.  please keep a record of the readings and bring it to your next appointment here (or you can bring the meter itself).  You can write it on any piece of paper.  please call us sooner if your blood sugar goes below 70, or if you have a lot of readings over 200.   Please come back for a follow-up appointment in 2 months.     Bariatric Surgery You have so much to gain by losing weight.  You may have already tried every diet and exercise plan imaginable.  And, you may have sought advice from your family physician, too.   Sometimes, in spite of such diligent efforts, you may not be able to achieve long-term results by yourself.  In cases of severe obesity, bariatric or weight loss surgery is a proven method of achieving long-term weight control.  Our Services Our bariatric surgery programs offer our patients new hope and long-term weight-loss solution.  Since introducing our services in 2003, we have conducted more than 2,400 successful procedures.  Our program is designated as a Programmer, multimedia by the Metabolic and Bariatric Surgery Accreditation and Quality Improvement Program (MBSAQIP), a IT trainer that sets rigorous patient safety and outcome standards.  Our program is also designated as a Ecologist by [employer].   Our exceptional weight-loss surgery team specializes in diagnosis, treatment, follow-up care, and ongoing support for our patients with severe weight loss challenges.  We currently offer laparoscopic sleeve gastrectomy, gastric bypass, and adjustable gastric band  (LAP-BAND).    Attend our [place] Choosing to undergo a bariatric procedure is a big decision, and one that should not be taken lightly.  You now have two options in how you learn about weight-loss surgery - in person or online.  Our objective is to ensure you have all of the information that you need to evaluate the advantages and obligations of this life changing procedure.  Please note that you are not alone in this process, and our experienced team is ready to assist and answer all of your questions.  There are several ways to register for a seminar (either on-line or in person): 1)  Call [phone] 2) Go on-line to [facility] and register for either type of seminar.  [email or web address]

## 2019-01-28 NOTE — Progress Notes (Signed)
Subjective:    Patient ID: Stephen Hall, male    DOB: [birth year], 38 y.o.   MRN: [id number]  HPI Pt returns for f/u of diabetes mellitus: DM type: Insulin-requiring type 2 Dx'ed: 8295 Complications: none Therapy: insulin since 2017, and 2 oral meds.   DKA: never Severe hypoglycemia: never. Pancreatitis: never.  Other: he declines multiple daily injections; works as Futures trader; he is considering weight loss surgery.   Interval history: Pt says he sometimes misses DM meds.  Pt says the soliqua is causing abd bloating and heartburn.  Past Medical History:  Diagnosis Date  . Asthma   . Diabetes mellitus without complication ([place])   . Environmental allergies     No past surgical history on file.  Social History   Socioeconomic History  . Marital status: Married    Spouse name: Not on file  . Number of children: Not on file  . Years of education: Not on file  . Highest education level: Not on file  Occupational History  . Not on file  Social Needs  . Financial resource strain: Not on file  . Food insecurity    Worry: Not on file    Inability: Not on file  . Transportation needs    Medical: Not on file    Non-medical: Not on file  Tobacco Use  . Smoking status: Never Smoker  . Smokeless tobacco: Never Used  Substance and Sexual Activity  . Alcohol use: No  . Drug use: No  . Sexual activity: Yes  Lifestyle  . Physical activity    Days per week: Not on file    Minutes per session: Not on file  . Stress: Not on file  Relationships  . Social Herbalist on phone: Not on file    Gets together: Not on file    Attends religious service: Not on file    Active member of club or organization: Not on file    Attends meetings of clubs or organizations: Not on file    Relationship status: Not on file  . Intimate partner violence    Fear of current or ex partner: Not on file    Emotionally abused: Not on file    Physically abused: Not on file   Forced sexual activity: Not on file  Other Topics Concern  . Not on file  Social History Narrative   Caffienated drinks-yes   Seat belt use often-yes   Regular Exercise-no   Smoke alarm in the home-yes   Firearms/guns in the home-no   History of physical abuse-no                Current Outpatient Medications on File Prior to Visit  Medication Sig Dispense Refill  . Blood Glucose Monitoring Suppl (ONETOUCH VERIO FLEX SYSTEM) w/Device KIT 1 Act by Does not apply route 3 (three) times daily. 2 kit 0  . Candesartan Cilexetil-HCTZ 32-25 MG TABS TAKE 1 TABLET BY MOUTH DAILY. OVERDUE FOR FOLLOW-UP APPT MUST SEE PROVIDER FOR REFILLS 90 tablet 0  . Dapagliflozin-metFORMIN HCl ER (XIGDUO XR) 10-998 MG TB24 Take 2 tablets by mouth daily. Must keep schedule appt for future refills 180 tablet 1  . glucose blood (ONETOUCH VERIO) test strip Use TID 100 each 0  . Insulin Pen Needle (NOVOFINE) 32G X 6 MM MISC 2 Act by Does not apply route daily. 200 each 1  . ONETOUCH DELICA LANCETS 62Z MISC USE TO HELP CHECK BLOOD SUGARS 3 TIMES A DAY  100 each 0  . rosuvastatin (CRESTOR) 10 MG tablet TAKE 1 TABLET BY MOUTH ONCE A DAY 90 tablet 0   No current facility-administered medications on file prior to visit.     No Known Allergies  Family History  Problem Relation Age of Onset  . Alcohol abuse Other   . Arthritis Other   . Hypertension Other   . Diabetes Other   . Obesity Mother   . Hypertension Mother   . Obesity Father   . Hypertension Father   . Cancer Neg Hx   . Heart disease Neg Hx   . Stroke Neg Hx   . Kidney disease Neg Hx     BP 120/82 (BP Location: Left Arm, Patient Position: Sitting, Cuff Size: Large)   Pulse 98   Ht '6\' 5"'  (1.956 m)   Wt (!) 329 lb (149.2 kg)   SpO2 94%   BMI 39.01 kg/m   Review of Systems He denies hypoglycemia    Objective:   Physical Exam VITAL SIGNS:  See vs page GENERAL: no distress Pulses: dorsalis pedis intact bilat.   MSK: no deformity of the  feet CV: no leg edema Skin:  no ulcer on the feet.  normal color and temp on the feet. Neuro: sensation is intact to touch on the feet  Lab Results  Component Value Date   HGBA1C 13.6 (A) 01/28/2019       Assessment & Plan:  Insulin-requiring type 2 DM: worse Obesity, persistent abd bloating, new, prob due to [place]  Patient Instructions  Please change the soliqua to tresiba, 80 units daily.  This is a slow time-release insulin, so if you miss it, you can make it up when you remember.   Please continue the same Xigduo.   check your blood sugar twice a day.  vary the time of day when you check, between before the 3 meals, and at bedtime.  also check if you have symptoms of your blood sugar being too high or too low.  please keep a record of the readings and bring it to your next appointment here (or you can bring the meter itself).  You can write it on any piece of paper.  please call us sooner if your blood sugar goes below 70, or if you have a lot of readings over 200.   Please come back for a follow-up appointment in 2 months.     Bariatric Surgery You have so much to gain by losing weight.  You may have already tried every diet and exercise plan imaginable.  And, you may have sought advice from your family physician, too.   Sometimes, in spite of such diligent efforts, you may not be able to achieve long-term results by yourself.  In cases of severe obesity, bariatric or weight loss surgery is a proven method of achieving long-term weight control.  Our Services Our bariatric surgery programs offer our patients new hope and long-term weight-loss solution.  Since introducing our services in 2003, we have conducted more than 2,400 successful procedures.  Our program is designated as a Programmer, multimedia by the Metabolic and Bariatric Surgery Accreditation and Quality Improvement Program (MBSAQIP), a IT trainer that sets rigorous patient safety and outcome standards.   Our program is also designated as a Ecologist by [employer].   Our exceptional weight-loss surgery team specializes in diagnosis, treatment, follow-up care, and ongoing support for our patients with severe weight loss challenges.  We currently offer laparoscopic  sleeve gastrectomy, gastric bypass, and adjustable gastric band (LAP-BAND).    Attend our [place] Choosing to undergo a bariatric procedure is a big decision, and one that should not be taken lightly.  You now have two options in how you learn about weight-loss surgery - in person or online.  Our objective is to ensure you have all of the information that you need to evaluate the advantages and obligations of this life changing procedure.  Please note that you are not alone in this process, and our experienced team is ready to assist and answer all of your questions.  There are several ways to register for a seminar (either on-line or in person): 1)  Call [phone] 2) Go on-line to [facility] and register for either type of seminar.  [email or web address]

## 2019-02-02 ENCOUNTER — Ambulatory Visit (INDEPENDENT_AMBULATORY_CARE_PROVIDER_SITE_OTHER): Payer: BC Managed Care – PPO | Admitting: Internal Medicine

## 2019-02-02 ENCOUNTER — Other Ambulatory Visit: Payer: Self-pay

## 2019-02-02 ENCOUNTER — Encounter: Payer: Self-pay | Admitting: Internal Medicine

## 2019-02-02 VITALS — BP 130/90 | HR 84 | Temp 98.4°F | Ht 77.0 in | Wt 329.0 lb

## 2019-02-02 DIAGNOSIS — M5106 Intervertebral disc disorders with myelopathy, lumbar region: Secondary | ICD-10-CM | POA: Insufficient documentation

## 2019-02-02 DIAGNOSIS — M5416 Radiculopathy, lumbar region: Secondary | ICD-10-CM | POA: Diagnosis not present

## 2019-02-02 MED ORDER — METHYLPREDNISOLONE 4 MG PO TBPK: ORAL_TABLET | ORAL | 0 refills | Status: AC

## 2019-02-02 MED ORDER — CYCLOBENZAPRINE HCL 5 MG PO TABS: 5.0000 mg | ORAL_TABLET | Freq: Three times a day (TID) | ORAL | 1 refills | Status: DC | PRN

## 2019-02-02 MED FILL — METHYLPREDNISOLONE 4 MG TBP: 4 | 6 days supply | Qty: 21 | Fill #0

## 2019-02-02 MED FILL — CYCLOBENZAPRINE HCL 5 MG TA: 5 | 30 days supply | Qty: 90 | Fill #0

## 2019-02-02 NOTE — Progress Notes (Signed)
 Subjective:  Patient ID: Stephen Hall, male    DOB: 02/25/1981  Age: 38 y.o. MRN: [id number]  CC: Back Pain   HPI Zamir Fortunato presents for a 2-week history of nontraumatic left lower back pain that radiates into his left thigh.  He describes it as a sharp sensation with spasms.  The pain interferes with his sleep.  He has had intermittent weakness in his left lower extremity.  He says all of his symptoms have started improving over the last few days.  He has taken Motrin which has provided moderate symptom relief.  Outpatient Medications Prior to Visit  Medication Sig Dispense Refill  . Blood Glucose Monitoring Suppl (ONETOUCH VERIO FLEX SYSTEM) w/Device KIT 1 Act by Does not apply route 3 (three) times daily. 2 kit 0  . Candesartan Cilexetil-HCTZ 32-25 MG TABS TAKE 1 TABLET BY MOUTH DAILY. OVERDUE FOR FOLLOW-UP APPT MUST SEE PROVIDER FOR REFILLS 90 tablet 0  . Dapagliflozin-metFORMIN HCl ER (XIGDUO XR) 10-998 MG TB24 Take 2 tablets by mouth daily. Must keep schedule appt for future refills 180 tablet 1  . glucose blood (ONETOUCH VERIO) test strip Use TID 100 each 0  . Insulin Degludec (TRESIBA FLEXTOUCH) 200 UNIT/ML SOPN Inject 80 Units into the skin daily. 6 pen 11  . Insulin Pen Needle (NOVOFINE) 32G X 6 MM MISC 2 Act by Does not apply route daily. 200 each 1  . ONETOUCH DELICA LANCETS 33G MISC USE TO HELP CHECK BLOOD SUGARS 3 TIMES A DAY 100 each 0  . rosuvastatin (CRESTOR) 10 MG tablet TAKE 1 TABLET BY MOUTH ONCE A DAY 90 tablet 0   No facility-administered medications prior to visit.     ROS Review of Systems  Constitutional: Negative for chills, diaphoresis, fatigue and fever.  HENT: Negative.   Eyes: Negative for visual disturbance.  Respiratory: Negative for cough, chest tightness, shortness of breath and wheezing.   Cardiovascular: Negative for chest pain, palpitations and leg swelling.  Gastrointestinal: Negative for abdominal pain, constipation, diarrhea, nausea and  vomiting.  Endocrine: Negative.   Genitourinary: Negative.  Negative for difficulty urinating.  Musculoskeletal: Positive for back pain. Negative for arthralgias, gait problem, myalgias and neck pain.  Skin: Negative.  Negative for color change and rash.  Neurological: Positive for weakness. Negative for dizziness, light-headedness and numbness.  Hematological: Negative for adenopathy. Does not bruise/bleed easily.  Psychiatric/Behavioral: Negative.     Objective:  BP 130/90 (BP Location: Left Arm, Patient Position: Sitting, Cuff Size: Large)   Pulse 84   Temp 98.4 F (36.9 C) (Oral)   Ht 6' 5" (1.956 m)   Wt (!) 329 lb (149.2 kg)   SpO2 99%   BMI 39.01 kg/m   BP Readings from Last 3 Encounters:  02/02/19 130/90  01/28/19 120/82  11/26/18 132/88    Wt Readings from Last 3 Encounters:  02/02/19 (!) 329 lb (149.2 kg)  01/28/19 (!) 329 lb (149.2 kg)  11/26/18 (!) 337 lb (152.9 kg)    Physical Exam Vitals signs reviewed.  Constitutional:      General: He is not in acute distress.    Appearance: He is obese. He is not ill-appearing, toxic-appearing or diaphoretic.  HENT:     Nose: Nose normal.     Mouth/Throat:     Pharynx: No oropharyngeal exudate.  Eyes:     General: No scleral icterus.    Conjunctiva/sclera: Conjunctivae normal.  Neck:     Musculoskeletal: Normal range of motion. No neck rigidity.  Cardiovascular:       Rate and Rhythm: Normal rate and regular rhythm.     Heart sounds: No murmur.  Pulmonary:     Effort: Pulmonary effort is normal. No respiratory distress.     Breath sounds: No stridor. No wheezing, rhonchi or rales.  Abdominal:     General: Abdomen is protuberant. Bowel sounds are normal.     Palpations: There is no hepatomegaly or splenomegaly.     Tenderness: There is no abdominal tenderness.  Musculoskeletal: Normal range of motion.        General: No swelling.     Lumbar back: Normal. He exhibits normal range of motion, no tenderness, no  bony tenderness, no swelling, no edema, no deformity, no pain and no spasm.     Right lower leg: No edema.     Left lower leg: No edema.  Skin:    General: Skin is warm and dry.     Coloration: Skin is not pale.  Neurological:     General: No focal deficit present.     Mental Status: He is oriented to person, place, and time. Mental status is at baseline.     Sensory: No sensory deficit.     Motor: No weakness.     Coordination: Coordination normal.     Gait: Gait normal.     Deep Tendon Reflexes: Reflexes normal.     Reflex Scores:      Tricep reflexes are 0 on the right side and 0 on the left side.      Bicep reflexes are 1+ on the right side and 1+ on the left side.      Brachioradialis reflexes are 1+ on the right side and 1+ on the left side.      Patellar reflexes are 1+ on the right side and 1+ on the left side.      Achilles reflexes are 0 on the right side and 0 on the left side.    Comments: Neg SLR in BLE  Psychiatric:        Mood and Affect: Mood normal.        Behavior: Behavior normal.     Lab Results  Component Value Date   WBC 7.0 12/18/2017   HGB 15.2 12/18/2017   HCT 46.5 12/18/2017   PLT 258.0 12/18/2017   GLUCOSE 214 (H) 11/26/2018   CHOL 163 04/29/2018   TRIG 147.0 04/29/2018   HDL 37.70 (L) 04/29/2018   LDLDIRECT 158.1 02/07/2012   LDLCALC 96 04/29/2018   ALT 18 12/18/2017   AST 11 12/18/2017   NA 134 (L) 11/26/2018   K 3.8 11/26/2018   CL 100 11/26/2018   CREATININE 0.95 11/26/2018   BUN 13 11/26/2018   CO2 21 11/26/2018   TSH 2.05 12/18/2017   HGBA1C 13.6 (A) 01/28/2019   MICROALBUR 1.7 11/26/2018    No results found.  Assessment & Plan:   Dilan was seen today for back pain.  Diagnoses and all orders for this visit:  Left lumbar radiculitis- See below -     methylPREDNISolone (MEDROL DOSEPAK) 4 MG TBPK tablet; TAKE AS DIRECTED -     cyclobenzaprine (FLEXERIL) 5 MG tablet; Take 1 tablet (5 mg total) by mouth 3 (three) times daily  as needed for muscle spasms.  Lumbar disc herniation with myelopathy- He has radiating low back pain but is neurologically intact and his symptoms are improving.  I suspect that he has a resolving lumbar disc herniation.  I recommended that he start a course of methylprednisolone  to decrease the pain and inflammation, to take a muscle relaxer as needed, and to continue ibuprofen as needed. -     methylPREDNISolone (MEDROL DOSEPAK) 4 MG TBPK tablet; TAKE AS DIRECTED -     cyclobenzaprine (FLEXERIL) 5 MG tablet; Take 1 tablet (5 mg total) by mouth 3 (three) times daily as needed for muscle spasms.   I am having Odes August start on methylPREDNISolone and cyclobenzaprine. I am also having him maintain his OneTouch Verio Flex System, OneTouch Delica Lancets 33G, glucose blood, Dapagliflozin-metFORMIN HCl ER, Candesartan Cilexetil-HCTZ, rosuvastatin, NovoFine, and Tresiba FlexTouch.  Meds ordered this encounter  Medications  . methylPREDNISolone (MEDROL DOSEPAK) 4 MG TBPK tablet    Sig: TAKE AS DIRECTED    Dispense:  21 tablet    Refill:  0  . cyclobenzaprine (FLEXERIL) 5 MG tablet    Sig: Take 1 tablet (5 mg total) by mouth 3 (three) times daily as needed for muscle spasms.    Dispense:  90 tablet    Refill:  1     Follow-up: No follow-ups on file.  Thomas Jones, MD 

## 2019-02-02 NOTE — Patient Instructions (Signed)
Acute Back Pain, Adult Acute back pain is sudden and usually short-lived. It is often caused by an injury to the muscles and tissues in the back. The injury may result from:  A muscle or ligament getting overstretched or torn (strained). Ligaments are tissues that connect bones to each other. Lifting something improperly can cause a back strain.  Wear and tear (degeneration) of the spinal disks. Spinal disks are circular tissue that provides cushioning between the bones of the spine (vertebrae).  Twisting motions, such as while playing sports or doing yard work.  A hit to the back.  Arthritis. You may have a physical exam, lab tests, and imaging tests to find the cause of your pain. Acute back pain usually goes away with rest and home care. Follow these instructions at home: Managing pain, stiffness, and swelling  Take over-the-counter and prescription medicines only as told by your health care provider.  Your health care provider may recommend applying ice during the first 24-48 hours after your pain starts. To do this: ? Put ice in a plastic bag. ? Place a towel between your skin and the bag. ? Leave the ice on for 20 minutes, 2-3 times a day.  If directed, apply heat to the affected area as often as told by your health care provider. Use the heat source that your health care provider recommends, such as a moist heat pack or a heating pad. ? Place a towel between your skin and the heat source. ? Leave the heat on for 20-30 minutes. ? Remove the heat if your skin turns bright red. This is especially important if you are unable to feel pain, heat, or cold. You have a greater risk of getting burned. Activity   Do not stay in bed. Staying in bed for more than 1-2 days can delay your recovery.  Sit up and stand up straight. Avoid leaning forward when you sit, or hunching over when you stand. ? If you work at a desk, sit close to it so you do not need to lean over. Keep your chin tucked  in. Keep your neck drawn back, and keep your elbows bent at a right angle. Your arms should look like the letter "L." ? Sit high and close to the steering wheel when you drive. Add lower back (lumbar) support to your car seat, if needed.  Take short walks on even surfaces as soon as you are able. Try to increase the length of time you walk each day.  Do not sit, drive, or stand in one place for more than 30 minutes at a time. Sitting or standing for long periods of time can put stress on your back.  Do not drive or use heavy machinery while taking prescription pain medicine.  Use proper lifting techniques. When you bend and lift, use positions that put less stress on your back: ? Bend your knees. ? Keep the load close to your body. ? Avoid twisting.  Exercise regularly as told by your health care provider. Exercising helps your back heal faster and helps prevent back injuries by keeping muscles strong and flexible.  Work with a physical therapist to make a safe exercise program, as recommended by your health care provider. Do any exercises as told by your physical therapist. Lifestyle  Maintain a healthy weight. Extra weight puts stress on your back and makes it difficult to have good posture.  Avoid activities or situations that make you feel anxious or stressed. Stress and anxiety increase muscle   tension and can make back pain worse. Learn ways to manage anxiety and stress, such as through exercise. General instructions  Sleep on a firm mattress in a comfortable position. Try lying on your side with your knees slightly bent. If you lie on your back, put a pillow under your knees.  Follow your treatment plan as told by your health care provider. This may include: ? Cognitive or behavioral therapy. ? Acupuncture or massage therapy. ? Meditation or yoga. Contact a health care provider if:  You have pain that is not relieved with rest or medicine.  You have increasing pain going down  into your legs or buttocks.  Your pain does not improve after 2 weeks.  You have pain at night.  You lose weight without trying.  You have a fever or chills. Get help right away if:  You develop new bowel or bladder control problems.  You have unusual weakness or numbness in your arms or legs.  You develop nausea or vomiting.  You develop abdominal pain.  You feel faint. Summary  Acute back pain is sudden and usually short-lived.  Use proper lifting techniques. When you bend and lift, use positions that put less stress on your back.  Take over-the-counter and prescription medicines and apply heat or ice as directed by your health care provider. This information is not intended to replace advice given to you by your health care provider. Make sure you discuss any questions you have with your health care provider. Document Released: 06/18/2005 Document Revised: 10/07/2018 Document Reviewed: 01/30/2017 Elsevier Patient Education  2020 [employer]  

## 2019-02-17 MED FILL — CANDESARTAN-HCTZ 32-25 MG T: 32-25 | 30 days supply | Qty: 30 | Fill #1

## 2019-03-30 ENCOUNTER — Other Ambulatory Visit: Payer: Self-pay

## 2019-04-01 ENCOUNTER — Other Ambulatory Visit: Payer: Self-pay

## 2019-04-01 ENCOUNTER — Encounter: Payer: Self-pay | Admitting: Endocrinology

## 2019-04-01 ENCOUNTER — Ambulatory Visit: Payer: BC Managed Care – PPO | Admitting: Endocrinology

## 2019-04-01 VITALS — BP 132/64 | HR 80 | Ht 77.0 in | Wt 340.8 lb

## 2019-04-01 DIAGNOSIS — E669 Obesity, unspecified: Secondary | ICD-10-CM

## 2019-04-01 DIAGNOSIS — E1169 Type 2 diabetes mellitus with other specified complication: Secondary | ICD-10-CM

## 2019-04-01 LAB — POCT GLYCOSYLATED HEMOGLOBIN (HGB A1C): Hemoglobin A1C: 11.2 % — AB (ref 4.0–5.6)

## 2019-04-01 MED ORDER — TRESIBA FLEXTOUCH 200 UNIT/ML ~~LOC~~ SOPN: 100.0000 [IU] | PEN_INJECTOR | Freq: Every day | SUBCUTANEOUS | 11 refills | Status: DC

## 2019-04-01 MED FILL — TRESIBA FLEXTOUCH 200 UNITS: 200 | 36 days supply | Qty: 18 | Fill #0

## 2019-04-01 NOTE — Patient Instructions (Addendum)
Please increase the Tresiba to 100 units daily.  This is a slow time-release insulin, so if you miss it, you can make it up when you remember.   Please continue the same Xigduo.   check your blood sugar twice a day.  vary the time of day when you check, between before the 3 meals, and at bedtime.  also check if you have symptoms of your blood sugar being too high or too low.  please keep a record of the readings and bring it to your next appointment here (or you can bring the meter itself).  You can write it on any piece of paper.  please call us sooner if your blood sugar goes below 70, or if you have a lot of readings over 200.   Please continue to consider having weight loss surgery.  It is good for your health.  Here is some information about it.  If you decide to consider further, please call the phone number in the papers, and register for a free informational meeting Please come back for a follow-up appointment in 2 months.
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
## 2019-04-14 ENCOUNTER — Other Ambulatory Visit: Payer: Self-pay | Admitting: Internal Medicine

## 2019-04-14 ENCOUNTER — Encounter: Payer: Self-pay | Admitting: Internal Medicine

## 2019-04-14 DIAGNOSIS — E1169 Type 2 diabetes mellitus with other specified complication: Secondary | ICD-10-CM

## 2019-04-14 DIAGNOSIS — E785 Hyperlipidemia, unspecified: Secondary | ICD-10-CM

## 2019-04-14 MED FILL — ROSUVASTATIN CALCIUM 10 MG: 10 | 90 days supply | Qty: 90 | Fill #0

## 2019-04-14 MED FILL — CANDESARTAN CILEXETIL-HCTZ: 32-25 | 30 days supply | Qty: 30 | Fill #2

## 2019-04-14 MED FILL — ONETOUCH DELICA PLUS LANCET: 33 days supply | Qty: 100 | Fill #0

## 2019-04-14 MED FILL — ONETOUCH VERIO TEST STRIP: 67 days supply | Qty: 200 | Fill #0

## 2019-06-05 ENCOUNTER — Other Ambulatory Visit: Payer: Self-pay | Admitting: Internal Medicine

## 2019-06-05 ENCOUNTER — Other Ambulatory Visit: Payer: Self-pay

## 2019-06-05 ENCOUNTER — Telehealth: Payer: Self-pay

## 2019-06-05 DIAGNOSIS — I10 Essential (primary) hypertension: Secondary | ICD-10-CM

## 2019-06-05 DIAGNOSIS — E1169 Type 2 diabetes mellitus with other specified complication: Secondary | ICD-10-CM

## 2019-06-05 DIAGNOSIS — E669 Obesity, unspecified: Secondary | ICD-10-CM

## 2019-06-05 MED ORDER — ONETOUCH DELICA LANCETS 33G MISC: 1 refills | Status: DC

## 2019-06-05 MED ORDER — TRESIBA FLEXTOUCH 200 UNIT/ML ~~LOC~~ SOPN: 100.0000 [IU] | PEN_INJECTOR | Freq: Every day | SUBCUTANEOUS | 11 refills | Status: DC

## 2019-06-05 MED FILL — TRESIBA FLEXTOUCH 200 UNITS: 200 | 36 days supply | Qty: 18 | Fill #0

## 2019-06-05 MED FILL — ONETOUCH DELICA PLUS LANCET: 33 days supply | Qty: 100 | Fill #0

## 2019-06-05 MED FILL — CANDESARTAN CILEXETIL-HCTZ: 32-25 | 30 days supply | Qty: 30 | Fill #0

## 2019-06-05 NOTE — Telephone Encounter (Signed)
Please advise about refills:  1.  Insulin Degludec (TRESIBA FLEXTOUCH) 200 UNIT/ML SOPN 2.  OneTouch Delica Lancets 71G MISC 3.  Candesartan Cilexetil-HCTZ 32-25 MG TABS

## 2019-06-05 NOTE — Telephone Encounter (Signed)
Copied from [place] [id number]. Topic: Quick Communication - Rx Refill/Question >> Jun 05, 2019 12:05 PM Leward Quan A wrote: Medication: Candesartan Cilexetil-HCTZ 32-25 MG TABS   Has the patient contacted their pharmacy? Yes.   (Agent: If no, request that the patient contact the pharmacy for the refill.) (Agent: If yes, when and what did the pharmacy advise?)  Preferred Pharmacy (with phone number or street name): [place], [place] - [place] [phone] (Phone) [phone] (Fax)    Agent: Please be advised that RX refills may take up to 3 business days. We ask that you follow-up with your pharmacy.

## 2019-06-05 NOTE — Telephone Encounter (Signed)
Insulin Degludec (TRESIBA FLEXTOUCH) 200 UNIT/ML SOPN 6 pen 11 06/05/2019    Sig - Route: Inject 100 Units into the skin daily. - Subcutaneous   Sent to pharmacy as: Insulin Degludec (TRESIBA FLEXTOUCH) 200 UNIT/ML Solution Pen-injector   E-Prescribing Status: Receipt confirmed by pharmacy (06/05/2019 10:07 AM EST)    OneTouch Delica Lancets 11H MISC 100 each 1 06/05/2019    Sig: USE TO CHECK BLOOD GLUCOSE 3 TIMES A DAY   Sent to pharmacy as: Jonetta Speak Lancets 41D Misc   E-Prescribing Status: Receipt confirmed by pharmacy (06/05/2019 10:07 AM EST)    Called pt and informed about refills submitted above. Also advised that Dr. Loanne Drilling denied refill request for b/p medication. Advised to contact PCP for refills. Using closed-loop communication, pt verbalized complete acceptance and understanding of all information provided. No further questions nor concerns were voiced at this time.

## 2019-06-05 NOTE — Telephone Encounter (Signed)
Insulin Degludec (TRESIBA FLEXTOUCH) 200 UNIT/ML SOPN, and OneTouch Delica Lancets 25E MISC: Please refill PRN  Candesartan Cilexetil-HCTZ 32-25 MG TABS: Please forward refill request to pt's primary care provider.

## 2019-06-05 NOTE — Telephone Encounter (Signed)
MEDICATION: Insulin Degludec (TRESIBA FLEXTOUCH) 200 UNIT/ML SOPN  OneTouch Delica Lancets 91B MISC  Candesartan Cilexetil-HCTZ 32-25 MG TABS  PHARMACY:  [place], [place] A 90 DAY SUPPLY :   IS PATIENT OUT OF MEDICATION:   IF NOT; HOW MUCH IS LEFT:   LAST APPOINTMENT DATE: @9 /30/2020  NEXT APPOINTMENT DATE:@1 /14/2021  DO WE HAVE YOUR PERMISSION TO LEAVE A DETAILED MESSAGE:  OTHER COMMENTS:    **Let patient know to contact pharmacy at the end of the day to make sure medication is ready. **  ** Please notify patient to allow 48-72 hours to process**  **Encourage patient to contact the pharmacy for refills or they can request refills through [facility]**

## 2019-06-10 ENCOUNTER — Ambulatory Visit: Payer: BC Managed Care – PPO | Admitting: Endocrinology

## 2019-07-07 LAB — HM DIABETES EYE EXAM

## 2019-07-14 ENCOUNTER — Other Ambulatory Visit: Payer: Self-pay

## 2019-07-16 ENCOUNTER — Other Ambulatory Visit: Payer: Self-pay

## 2019-07-16 ENCOUNTER — Telehealth: Payer: Self-pay

## 2019-07-16 ENCOUNTER — Encounter: Payer: Self-pay | Admitting: Endocrinology

## 2019-07-16 ENCOUNTER — Ambulatory Visit: Payer: BC Managed Care – PPO | Admitting: Endocrinology

## 2019-07-16 VITALS — BP 144/90 | HR 105 | Ht 77.0 in | Wt 344.6 lb

## 2019-07-16 DIAGNOSIS — E669 Obesity, unspecified: Secondary | ICD-10-CM

## 2019-07-16 DIAGNOSIS — E1169 Type 2 diabetes mellitus with other specified complication: Secondary | ICD-10-CM

## 2019-07-16 LAB — POCT GLYCOSYLATED HEMOGLOBIN (HGB A1C): Hemoglobin A1C: 11.8 % — AB (ref 4.0–5.6)

## 2019-07-16 MED ORDER — TRESIBA FLEXTOUCH 200 UNIT/ML ~~LOC~~ SOPN: 100.0000 [IU] | PEN_INJECTOR | Freq: Every day | SUBCUTANEOUS | 11 refills | Status: DC

## 2019-07-16 MED ORDER — XIGDUO XR 5-1000 MG PO TB24: 2.0000 | ORAL_TABLET | Freq: Every day | ORAL | 0 refills | Status: DC

## 2019-07-16 MED FILL — TRESIBA FLEXTOUCH 200 UNITS: 200 | 36 days supply | Qty: 18 | Fill #0

## 2019-07-16 MED FILL — XIGDUO XR 5 MG-1,000 MG TAB: 5-1000 | 90 days supply | Qty: 180 | Fill #1

## 2019-07-16 NOTE — Patient Instructions (Addendum)
Your blood pressure is high today.  Please see your primary care provider soon, to have it rechecked.   Please continue the same [place].  This is a slow time-release insulin, so if you miss it, you can make it up when you remember.   Please continue the same Xigduo.   check your blood sugar twice a day.  vary the time of day when you check, between before the 3 meals, and at bedtime.  also check if you have symptoms of your blood sugar being too high or too low.  please keep a record of the readings and bring it to your next appointment here (or you can bring the meter itself).  You can write it on any piece of paper.  please call us sooner if your blood sugar goes below 70, or if you have a lot of readings over 200.   Please come back for a follow-up appointment in 2 months.

## 2019-07-16 NOTE — Progress Notes (Signed)
Subjective:    Patient ID: Stephen Hall, male    DOB: 12-28-1980, 39 y.o.   MRN: [id number]  HPI Pt returns for f/u of diabetes mellitus: DM type: Insulin-requiring type 2 Dx'ed: 3545 Complications: none Therapy: insulin since 2017, and 2 oral meds.   DKA: never Severe hypoglycemia: never.   Pancreatitis: never.   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   Interval history: pt says he is still missing insulin doses.  pt states he feels well in general. no cbg record, but states cbg's are in the 200's.   Past Medical History:  Diagnosis Date  . Asthma   . Diabetes mellitus without complication ([place])   . Environmental allergies     No past surgical history on file.  Social History   Socioeconomic History  . Marital status: Married    Spouse name: Not on file  . Number of children: Not on file  . Years of education: Not on file  . Highest education level: Not on file  Occupational History  . Not on file  Tobacco Use  . Smoking status: Never Smoker  . Smokeless tobacco: Never Used  Substance and Sexual Activity  . Alcohol use: No  . Drug use: No  . Sexual activity: Yes  Other Topics Concern  . Not on file  Social History Narrative   Caffienated drinks-yes   Seat belt use often-yes   Regular Exercise-no   Smoke alarm in the home-yes   Firearms/guns in the home-no   History of physical abuse-no               Social Determinants of Health   Financial Resource Strain:   . Difficulty of Paying Living Expenses: Not on file  Food Insecurity:   . Worried About Charity fundraiser in the Last Year: Not on file  . Ran Out of Food in the Last Year: Not on file  Transportation Needs:   . Lack of Transportation (Medical): Not on file  . Lack of Transportation (Non-Medical): Not on file  Physical  Activity:   . Days of Exercise per Week: Not on file  . Minutes of Exercise per Session: Not on file  Stress:   . Feeling of Stress : Not on file  Social Connections:   . Frequency of Communication with Friends and Family: Not on file  . Frequency of Social Gatherings with Friends and Family: Not on file  . Attends Religious Services: Not on file  . Active Member of Clubs or Organizations: Not on file  . Attends Archivist Meetings: Not on file  . Marital Status: Not on file  Intimate Partner Violence:   . Fear of Current or Ex-Partner: Not on file  . Emotionally Abused: Not on file  . Physically Abused: Not on file  . Sexually Abused: Not on file    Current Outpatient Medications on File Prior to Visit  Medication Sig Dispense Refill  . Blood Glucose Monitoring Suppl (ONETOUCH VERIO FLEX SYSTEM) w/Device KIT 1 Act by Does not apply route 3 (three) times daily. 2 kit 0  . Candesartan Cilexetil-HCTZ 32-25 MG TABS TAKE 1 TABLET BY MOUTH DAILY. OVERDUE FOR FOLLOW-UP APPT MUST SEE PROVIDER FOR REFILLS 90 tablet 0  . cyclobenzaprine (FLEXERIL) 5 MG tablet Take 1 tablet (5 mg total) by mouth 3 (three) times daily  as needed for muscle spasms. 90 tablet 1  . Insulin Pen Needle (NOVOFINE) 32G X 6 MM MISC 2 Act by Does not apply route daily. 200 each 1  . OneTouch Delica Lancets 16X MISC USE TO CHECK BLOOD GLUCOSE 3 TIMES A DAY 100 each 1  . ONETOUCH VERIO test strip USE TO TEST BLOOD GLUCOSE 3 TIMES A DAY 200 strip 1  . rosuvastatin (CRESTOR) 10 MG tablet TAKE 1 TABLET BY MOUTH ONCE A DAY 90 tablet 0   No current facility-administered medications on file prior to visit.    No Known Allergies  Family History  Problem Relation Age of Onset  . Alcohol abuse Other   . Arthritis Other   . Hypertension Other   . Diabetes Other   . Obesity Mother   . Hypertension Mother   . Obesity Father   . Hypertension Father   . Cancer Neg Hx   . Heart disease Neg Hx   . Stroke Neg Hx   .  Kidney disease Neg Hx     BP (!) 144/90 (BP Location: Left Arm, Patient Position: Sitting, Cuff Size: Large)   Pulse (!) 105   Ht '6\' 5"'  (1.956 m)   Wt (!) 344 lb 9.6 oz (156.3 kg)   SpO2 98%   BMI 40.86 kg/m    Review of Systems He denies hypoglycemia.      Objective:   Physical Exam VITAL SIGNS:  See vs page GENERAL: no distress Pulses: dorsalis pedis intact bilat.   MSK: no deformity of the feet CV: no leg edema. Skin:  no ulcer on the feet.  normal color and temp on the feet. Neuro: sensation is intact to touch on the feet  Lab Results  Component Value Date   HGBA1C 11.8 (A) 07/16/2019        Assessment & Plan:  Worse.  He declines to increase the insulin.   HTN: is noted today Noncompliance with cbg recording: tresiba offers dosing flexibility  Patient Instructions  Your blood pressure is high today.  Please see your primary care provider soon, to have it rechecked.   Please continue the same [place].  This is a slow time-release insulin, so if you miss it, you can make it up when you remember.   Please continue the same Xigduo.   check your blood sugar twice a day.  vary the time of day when you check, between before the 3 meals, and at bedtime.  also check if you have symptoms of your blood sugar being too high or too low.  please keep a record of the readings and bring it to your next appointment here (or you can bring the meter itself).  You can write it on any piece of paper.  please call us sooner if your blood sugar goes below 70, or if you have a lot of readings over 200.   Please come back for a follow-up appointment in 2 months.

## 2019-07-16 NOTE — Telephone Encounter (Signed)
Outpatient Medication Detail   Disp Refills Start End   Dapagliflozin-metFORMIN HCl ER (XIGDUO XR) 10-998 MG TB24 180 tablet 0 07/16/2019    Sig - Route: Take 2 tablets by mouth daily. - Oral   Sent to pharmacy as: Dapagliflozin-metFORMIN HCl ER (XIGDUO XR) 10-998 MG Tablet SR 24 hr   E-Prescribing Status: Receipt confirmed by pharmacy (07/16/2019 10:25 AM EST)

## 2019-07-16 NOTE — Telephone Encounter (Signed)
MEDICATION: Dapagliflozin-metFORMIN HCl ER (XIGDUO XR) 10-998 MG TB24   PHARMACY:  [facility] - [place], [place] - [address]  IS THIS A 90 DAY SUPPLY :   IS PATIENT OUT OF MEDICATION:   IF NOT; HOW MUCH IS LEFT:   LAST APPOINTMENT DATE: @1 /14/2021  NEXT APPOINTMENT DATE:@3 /18/2021  DO WE HAVE YOUR PERMISSION TO LEAVE A DETAILED MESSAGE:  OTHER COMMENTS:    **Let patient know to contact pharmacy at the end of the day to make sure medication is ready. **  ** Please notify patient to allow 48-72 hours to process**  **Encourage patient to contact the pharmacy for refills or they can request refills through [facility]**

## 2019-08-02 ENCOUNTER — Ambulatory Visit (HOSPITAL_COMMUNITY)
Admission: EM | Admit: 2019-08-02 | Discharge: 2019-08-02 | Disposition: A | Discharge status: Home / Self Care | Payer: BC Managed Care – PPO | Attending: Family Medicine | Admitting: Family Medicine

## 2019-08-02 ENCOUNTER — Other Ambulatory Visit: Payer: Self-pay

## 2019-08-02 ENCOUNTER — Encounter (HOSPITAL_COMMUNITY): Payer: Self-pay

## 2019-08-02 DIAGNOSIS — L03012 Cellulitis of left finger: Secondary | ICD-10-CM | POA: Diagnosis not present

## 2019-08-02 MED ORDER — IBUPROFEN 800 MG PO TABS: 800.0000 mg | ORAL_TABLET | Freq: Three times a day (TID) | ORAL | 0 refills | Status: DC

## 2019-08-02 MED ORDER — IBUPROFEN 800 MG PO TABS
800.0000 mg | ORAL_TABLET | Freq: Once | ORAL | Status: AC
  Administered 2019-08-02: 800 mg via ORAL

## 2019-08-02 MED ORDER — IBUPROFEN 800 MG PO TABS
ORAL_TABLET | ORAL | Status: AC
  Filled 2019-08-02: qty 1

## 2019-08-02 MED ORDER — DOXYCYCLINE HYCLATE 100 MG PO CAPS: 100.0000 mg | ORAL_CAPSULE | Freq: Two times a day (BID) | ORAL | 0 refills | Status: DC

## 2019-08-02 NOTE — ED Triage Notes (Signed)
Pt present left ring finger is infected from biting his nails. Pt notice the swelling and pus on Friday evening.

## 2019-08-02 NOTE — ED Provider Notes (Signed)
[facility]    CSN: [id number] Arrival date & time: 08/02/19  1646      History   Chief Complaint Chief Complaint  Patient presents with  . Finger problem    HPI Stephen Hall is a 39 y.o. male.   Stephen Hall presents with complaints of redness, swelling and pain to left hand ring finger. Originated at lateral aspect of nail bed, he does endorse nail biting. Onset 1/29. Has had similar in the past but typically with care at home they have resolved. Has not had any drainage from the area. Tried pricking area with a safety pin and no production of drainage. Swelling has extended to proximal phalanx of ring finger. No numbness or tingling. Pain is not worse to tuft of finger. Still with full ROM of finger and joints. No injury. Hasn't taken any medications for symptoms. He is diabetic.     ROS per HPI, negative if not otherwise mentioned.      Past Medical History:  Diagnosis Date  . Asthma   . Diabetes mellitus without complication ([place])   . Environmental allergies     Patient Active Problem List   Diagnosis Date Noted  . 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 Vitamin D deficiency disease 12/18/2017  . Excessive daytime sleepiness 01/01/2017  . Essential hypertension 11/20/2016  . Hyperlipidemia with target LDL less than 130 07/12/2014  . 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 Diabetes mellitus type 2 in obese ([place]) 03/12/2013  . Left lumbar radiculitis 02/19/2013  . Morbid obesity ([place]) 02/19/2013  . Routine general medical examination at a health care facility 02/07/2012  . OSA (obstructive sleep apnea) 02/07/2012  . Asthma in remission 02/07/2012    History reviewed. No pertinent surgical history.     Home Medications    Prior to Admission medications   Medication Sig Start Date End Date Taking? Authorizing Provider  Blood Glucose Monitoring Suppl ([place]) w/Device KIT 1 Act by Does not apply route 3 (three) times  daily. 06/20/16   Janith Lima, MD  Candesartan Cilexetil-HCTZ 32-25 MG TABS TAKE 1 TABLET BY MOUTH DAILY. OVERDUE FOR FOLLOW-UP APPT MUST SEE PROVIDER FOR REFILLS 06/05/19   Janith Lima, MD  cyclobenzaprine (FLEXERIL) 5 MG tablet Take 1 tablet (5 mg total) by mouth 3 (three) times daily as needed for muscle spasms. 02/02/19   Janith Lima, MD  Dapagliflozin-metFORMIN HCl ER (XIGDUO XR) 10-998 MG TB24 Take 2 tablets by mouth daily. 07/16/19   Renato Shin, MD  doxycycline (VIBRAMYCIN) 100 MG capsule Take 1 capsule (100 mg total) by mouth 2 (two) times daily. 08/02/19   Zigmund Gottron, NP  ibuprofen (ADVIL) 800 MG tablet Take 1 tablet (800 mg total) by mouth 3 (three) times daily. 08/02/19   Zigmund Gottron, NP  Insulin Degludec (TRESIBA FLEXTOUCH) 200 UNIT/ML SOPN Inject 100 Units into the skin daily. 07/16/19   Renato Shin, MD  Insulin Pen Needle (NOVOFINE) 32G X 6 MM MISC 2 Act by Does not apply route daily. 01/06/19   Janith Lima, MD  OneTouch Delica Lancets 96Q MISC USE TO CHECK BLOOD GLUCOSE 3 TIMES A DAY 06/05/19   Renato Shin, MD  [facility] VERIO test strip USE TO TEST BLOOD GLUCOSE 3 TIMES A DAY 04/14/19   Janith Lima, MD  rosuvastatin (CRESTOR) 10 MG tablet TAKE 1 TABLET BY MOUTH ONCE A DAY 04/14/19   Janith Lima, MD    Family History Family History  Problem Relation Age of Onset  . Alcohol abuse Other   . Arthritis Other   . Hypertension Other   . Diabetes Other   . Obesity Mother   . Hypertension Mother   . Obesity Father   . Hypertension Father   . Cancer Neg Hx   . Heart disease Neg Hx   . Stroke Neg Hx   . Kidney disease Neg Hx     Social History Social History   Tobacco Use  . Smoking status: Never Smoker  . Smokeless tobacco: Never Used  Substance Use Topics  . Alcohol use: No  . Drug use: No     Allergies   Patient has no known allergies.   Review of Systems Review of Systems   Physical Exam Triage Vital Signs ED Triage Vitals    Enc Vitals Group     BP 08/02/19 1658 133/89     Pulse Rate 08/02/19 1658 (!) 114     Resp 08/02/19 1658 18     Temp 08/02/19 1658 98.5 F (36.9 C)     Temp Source 08/02/19 1658 Oral     SpO2 08/02/19 1658 98 %     Weight --      Height --      Head Circumference --      Peak Flow --      Pain Score 08/02/19 1656 2     Pain Loc --      Pain Edu? --      Excl. in [place]? --    No data found.  Updated Vital Signs BP 133/89 (BP Location: Right Arm)   Pulse (!) 114   Temp 98.5 F (36.9 C) (Oral)   Resp 18   SpO2 98%    Physical Exam Constitutional:      Appearance: He is well-developed.  Cardiovascular:     Rate and Rhythm: Normal rate.  Pulmonary:     Effort: Pulmonary effort is normal.  Musculoskeletal:     Left hand: Swelling and tenderness present. No bony tenderness. Normal strength. Normal sensation.     Comments: Left hand ring finger with redness swelling and warmth extending from lateral nail bed to proximal phalanx; tuft remains soft with minimal tenderness; no visible purulence or visible/palpable pocket for incision at this time; cap refill < 2 seconds  ; full ROM of finger   Skin:    General: Skin is warm and dry.  Neurological:     Mental Status: He is alert and oriented to person, place, and time.      UC Treatments / Results  Labs (all labs ordered are listed, but only abnormal results are displayed) Labs Reviewed - No data to display  EKG   Radiology No results found.  Procedures Procedures (including critical care time)  Medications Ordered in UC Medications  ibuprofen (ADVIL) tablet 800 mg (has no administration in time range)    Initial Impression / Assessment and Plan / UC Course  I have reviewed the triage vital signs and the nursing notes.  Pertinent labs & imaging results that were available during my care of the patient were reviewed by me and considered in my medical decision making (see chart for details).     Paronychia, no  obvious region for incision or puncture today. Regular warm soaks/compresses and antibiotics provided with strict return precautions as still may need to be opened if no drainage or improvement. No indication of felon presence. Patient verbalized understanding and agreeable to plan.  Final Clinical Impressions(s) / UC Diagnoses   Final diagnoses:  Paronychia of finger of left hand     Discharge Instructions     Soak in warm water regularly to promote potential drainage.  Complete course of antibiotics. Start tonight.  Ibuprofen every 8 hours as needed for pain, don't take for another 8 hours following dose here tonight.  Elevate hand to help with pain.  If no improvement or if worsening in the next 48-72 hours please return as this may need to be opened and drained.     ED Prescriptions    Medication Sig Dispense Auth. Provider   doxycycline (VIBRAMYCIN) 100 MG capsule Take 1 capsule (100 mg total) by mouth 2 (two) times daily. 20 capsule Augusto Gamble B, NP   ibuprofen (ADVIL) 800 MG tablet Take 1 tablet (800 mg total) by mouth 3 (three) times daily. 21 tablet Zigmund Gottron, NP     PDMP not reviewed this encounter.   Zigmund Gottron, NP 08/03/19 [phone]

## 2019-08-02 NOTE — Discharge Instructions (Signed)
Soak in warm water regularly to promote potential drainage.  Complete course of antibiotics. Start tonight.  Ibuprofen every 8 hours as needed for pain, don't take for another 8 hours following dose here tonight.  Elevate hand to help with pain.  If no improvement or if worsening in the next 48-72 hours please return as this may need to be opened and drained.

## 2019-08-03 ENCOUNTER — Telehealth: Payer: Self-pay | Admitting: Internal Medicine

## 2019-08-03 NOTE — Telephone Encounter (Signed)
Patient spoke with [facility] on 08/02/19 at 4:07pm.  States he was having increasing discomfort on his fingers.  States they were red, warm to touch with fluid accumulating.  States he does have a history of nail biting.  Patient was seen at [facility]. I followed up with patient by phone today.  Patient states he was prescribed an antibiotic.  He is going to give the antibiotic a couple days to work.  If he is not feeling any better by mid week, he will call back to make appt.

## 2019-08-04 ENCOUNTER — Other Ambulatory Visit: Payer: Self-pay

## 2019-08-04 ENCOUNTER — Encounter: Payer: Self-pay | Admitting: Internal Medicine

## 2019-08-04 ENCOUNTER — Other Ambulatory Visit: Payer: BC Managed Care – PPO

## 2019-08-04 ENCOUNTER — Telehealth: Payer: Self-pay | Admitting: *Deleted

## 2019-08-04 ENCOUNTER — Ambulatory Visit: Payer: BC Managed Care – PPO | Admitting: Internal Medicine

## 2019-08-04 VITALS — BP 138/88 | HR 102 | Temp 98.1°F | Ht 77.0 in | Wt 347.0 lb

## 2019-08-04 DIAGNOSIS — I10 Essential (primary) hypertension: Secondary | ICD-10-CM | POA: Diagnosis not present

## 2019-08-04 DIAGNOSIS — Z6841 Body Mass Index (BMI) 40.0 and over, adult: Secondary | ICD-10-CM | POA: Diagnosis not present

## 2019-08-04 DIAGNOSIS — Z0001 Encounter for general adult medical examination with abnormal findings: Secondary | ICD-10-CM | POA: Diagnosis not present

## 2019-08-04 DIAGNOSIS — E785 Hyperlipidemia, unspecified: Secondary | ICD-10-CM

## 2019-08-04 DIAGNOSIS — E669 Obesity, unspecified: Secondary | ICD-10-CM

## 2019-08-04 DIAGNOSIS — E1169 Type 2 diabetes mellitus with other specified complication: Secondary | ICD-10-CM | POA: Diagnosis not present

## 2019-08-04 DIAGNOSIS — E559 Vitamin D deficiency, unspecified: Secondary | ICD-10-CM

## 2019-08-04 DIAGNOSIS — L03012 Cellulitis of left finger: Secondary | ICD-10-CM | POA: Insufficient documentation

## 2019-08-04 DIAGNOSIS — Z0000 Encounter for general adult medical examination without abnormal findings: Secondary | ICD-10-CM

## 2019-08-04 LAB — CBC WITH DIFFERENTIAL/PLATELET
Basophils Absolute: 0.1 10*3/uL (ref 0.0–0.1)
Basophils Relative: 0.7 % (ref 0.0–3.0)
Eosinophils Absolute: 0.1 10*3/uL (ref 0.0–0.7)
Eosinophils Relative: 1.2 % (ref 0.0–5.0)
HCT: 45.7 % (ref 39.0–52.0)
Hemoglobin: 14.9 g/dL (ref 13.0–17.0)
Lymphocytes Relative: 27.3 % (ref 12.0–46.0)
Lymphs Abs: 2.9 10*3/uL (ref 0.7–4.0)
MCHC: 32.6 g/dL (ref 30.0–36.0)
MCV: 78 fl (ref 78.0–100.0)
Monocytes Absolute: 0.8 10*3/uL (ref 0.1–1.0)
Monocytes Relative: 7.7 % (ref 3.0–12.0)
Neutro Abs: 6.8 10*3/uL (ref 1.4–7.7)
Neutrophils Relative %: 63.1 % (ref 43.0–77.0)
Platelets: 273 10*3/uL (ref 150.0–400.0)
RBC: 5.86 Mil/uL — ABNORMAL HIGH (ref 4.22–5.81)
RDW: 15.5 % (ref 11.5–15.5)
WBC: 10.8 10*3/uL — ABNORMAL HIGH (ref 4.0–10.5)

## 2019-08-04 LAB — HEPATIC FUNCTION PANEL
ALT: 16 U/L (ref 0–53)
AST: 10 U/L (ref 0–37)
Albumin: 4.3 g/dL (ref 3.5–5.2)
Alkaline Phosphatase: 98 U/L (ref 39–117)
Bilirubin, Direct: 0.1 mg/dL (ref 0.0–0.3)
Total Bilirubin: 0.5 mg/dL (ref 0.2–1.2)
Total Protein: 7.5 g/dL (ref 6.0–8.3)

## 2019-08-04 LAB — BASIC METABOLIC PANEL
BUN: 12 mg/dL (ref 6–23)
CO2: 26 mEq/L (ref 19–32)
Calcium: 9.6 mg/dL (ref 8.4–10.5)
Chloride: 98 mEq/L (ref 96–112)
Creatinine, Ser: 0.98 mg/dL (ref 0.40–1.50)
GFR: 103.97 mL/min (ref >60.00)
Glucose, Bld: 251 mg/dL — ABNORMAL HIGH (ref 70–99)
Potassium: 3.6 mEq/L (ref 3.5–5.1)
Sodium: 135 mEq/L (ref 135–145)

## 2019-08-04 LAB — LIPID PANEL
Cholesterol: 172 mg/dL (ref 0–200)
HDL: 30.8 mg/dL — ABNORMAL LOW (ref >39.00)
LDL Cholesterol: 111 mg/dL — ABNORMAL HIGH (ref 0–99)
NonHDL: 140.9
Total CHOL/HDL Ratio: 6
Triglycerides: 151 mg/dL — ABNORMAL HIGH (ref 0.0–149.0)
VLDL: 30.2 mg/dL (ref 0.0–40.0)

## 2019-08-04 LAB — TSH: TSH: 1.92 u[IU]/mL (ref 0.35–4.50)

## 2019-08-04 LAB — VITAMIN D 25 HYDROXY (VIT D DEFICIENCY, FRACTURES): VITD: 26.36 ng/mL — ABNORMAL LOW (ref 30.00–100.00)

## 2019-08-04 MED ORDER — CHOLECALCIFEROL 50 MCG (2000 UT) PO TABS: 1.0000 | ORAL_TABLET | Freq: Every day | ORAL | 3 refills | Status: DC

## 2019-08-04 NOTE — Telephone Encounter (Signed)
The specimen for HIV antibody test that was ordered today was not drawn. I called pt and informed him. I asked him if he could come back for re-draw. He states he can not come back in today. He states he will come back another day. Lab order for future visit.

## 2019-08-04 NOTE — Patient Instructions (Signed)
Fingertip Infection °There are two main types of fingertip infections: °· Long-term (chronic) or acute paronychia. This is an infection that happens around your nail. This type of infection can start in one nail or occur gradually over time and affect more than one nail. The fingernails that are infected may become thick and deformed. This condition can also happen suddenly (be acute). °· Felon. This is a bacterial infection in the tip of your finger (pad). A felon infection can cause a painful collection of pus (an abscess) to form inside your fingertip. If the infection is not treated, the infection can spread as deep as the tendon or bone. °What are the causes? °Paronychia infection can be caused by: °· Bacteria. °· Funguses. °· A mix of both bacteria and funguses. °A felon infection is usually caused by the bacteria that are normally found on your skin. An infection can develop if the bacteria spread through your skin to the pad of tissue inside your fingertip. °What increases the risk? °You are more likely to develop a fingertip infection if: °· You have diabetes. °· You have a weak body's defense system (immune system). °· You work with your hands. °· Your hands are exposed to moisture, chemicals, or irritants for long periods of time. °· You have poor circulation. °· You bite, chew, or pick your fingernails. °What are the signs or symptoms? °Symptoms of paronychia infection may affect one or more fingernails and may include: °· Pain, swelling, and redness around the nail. °· Pus-filled pockets at the base or side of the fingernail (cuticle). °· Thick fingernails that separate from the nail bed. °· Pus that drains from the nail bed. °Symptoms of a felon usually affect just one fingertip pad and include: °· Severe, throbbing pain. °· Redness. °· Swelling. °· Warmth. °· Tenderness when the affected fingertip is touched. °How is this diagnosed? °This condition is diagnosed based on: °· Your medical history. °· A  physical exam. °· Testing. If there is pus draining from the infection, it may be swabbed and sent to the lab for a culture. °· An X-ray. This may be done to see if the infection has spread to the bone. °How is this treated? °Treatment for a fingertip infection may include: °· Warm water or salt-water soaks several times per day. °· Antibiotic medicine. This may be an ointment or pills. °· Steroid ointment. °· Antifungal pills. °· Drainage of pus pockets. This is done by making an incision to open the fingertip to drain pus. °· Wearing gloves to protect your nails. °Follow these instructions at home: °Medicines °· Take or apply over-the-counter and prescription medicines only as told by your health care provider. °· If you were prescribed an antibiotic medicine, take or apply it as told by your health care provider. Do not stop using the antibiotic even if you start to feel better. °Wound care °· Follow instructions from your health care provider about how to take care of your wound. Make sure you: °? Wash your hands with soap and water before and after you change your bandage (dressing). If soap and water are not available, use hand sanitizer. °? Change your dressing as told by your health care provider. °? Leave stitches (sutures), skin glue, or adhesive strips in place. These skin closures may need to stay in place for 2 weeks or longer. If adhesive strip edges start to loosen and curl up, you may trim the loose edges. Do not remove adhesive strips completely unless your health care   provider tells you to do that. °· Clean the infected area each day with warm water or salt water, or as told by your health care provider. °? Gently wash the infected area with mild soap and water. °? Rinse the infected area with water to remove all soap. °? Pat the infected area dry with a clean towel. Do not rub it. °? To make a salt-water mixture, completely dissolve ½-1 tsp (3-6 g) of salt in 1 cup (237 mL) of warm water. °· Check  the infected area every day for more signs of infection. Watch for: °? More redness, swelling, or pain. °? More fluid or blood. °? Warmth. °? A bad smell. °Bathing °· Keep the dressing dry until your health care provider says it can be removed. °· Ask your health care provider if you may take baths, swim, shower, or use a hot tub. To help prevent spread of the infection, you may only be allowed to take sponge baths. This is rare. °· Do not let your bandage get wet. Cover it with a watertight covering when you take a bath or shower. °General instructions °· Follow instructions from your health care provider about: °? How to take care of the infection. °? When and how you should change your bandage (dressing). °? When you should remove your dressing. °· Raise (elevate) the infected area above the level of your heart while you are sitting or lying down or as told by your health care provider. This will help reduce inflammation. °· Do not scratch or pick at the infected area. °· Wear gloves as told by your health care provider. °· Keep all follow-up visits as told by your health care provider. This is important. °How is this prevented? °· Wear gloves when you work with your hands. °· Wash your hands often with antibacterial soap. °· Avoid letting your hands stay wet or irritated for long periods of time. °· Do not bite your fingernails. °· Do not suck on your fingers. °· Do not pull on your cuticles. °· Use clean scissors or nail clippers to trim your nails. Do not cut your fingernails very short. °Contact a health care provider if: °· Your pain medicine is not helping. °· You have more redness, swelling, or pain at your fingertip. °· You continue to have fluid, blood, or pus coming from your fingertip. °· Your infection area feels warm to the touch. °· You continue to notice a bad smell coming from your fingertip or your dressing. °Get help right away if: °· The area of redness is spreading, or you notice a red streak  going away from your fingertip. °· You have a fever. °Summary °· Paronychia is an infection that happens around your nail. Paronychia infection can be caused by bacteria, funguses, or a mix of both. °· A felon infection is usually caused by the bacteria that are normally found on your skin. An infection can develop if the bacteria spread through your skin to the pad of tissue inside your fingertip. °· Follow instructions from your health care provider about how to take care of the infection. °· Take or apply over-the-counter and prescription medicines only as told by your health care provider. °· Contact a health care provider if you have more drainage, redness, swelling, or pain at your fingertip. °This information is not intended to replace advice given to you by your health care provider. Make sure you discuss any questions you have with your health care provider. °Document Revised: 03/18/2018 Document   Reviewed: 03/18/2018 °Elsevier Patient Education © 2020 [employer] ° °

## 2019-08-04 NOTE — Progress Notes (Signed)
Subjective:  Patient ID: Stephen Hall, male    DOB: Jun 12, 1981  Age: 39 y.o. MRN: [id number]  CC: Annual Exam, Diabetes, Hypertension, and Hyperlipidemia  This visit occurred during the SARS-CoV-2 public health emergency.  Safety protocols were in place, including screening questions prior to the visit, additional usage of staff PPE, and extensive cleaning of exam room while observing appropriate contact time as indicated for disinfecting solutions.    HPI Stephen Hall presents for a CPX.  He complains of a 4-day history of worsening pain and swelling in the distal aspect of his left ring finger.  He tells me he was seen at an urgent care center about 2 days ago and was prescribed ibuprofen and doxycycline but he says the symptoms are worsening.   Outpatient Medications Prior to Visit  Medication Sig Dispense Refill  . Blood Glucose Monitoring Suppl (Stephen Hall FLEX SYSTEM) w/Device KIT 1 Act by Does not apply route 3 (three) times daily. 2 kit 0  . Dapagliflozin-metFORMIN HCl ER (XIGDUO XR) 10-998 MG TB24 Take 2 tablets by mouth daily. 180 tablet 0  . Insulin Degludec (TRESIBA FLEXTOUCH) 200 UNIT/ML SOPN Inject 100 Units into the skin daily. 6 pen 11  . Insulin Pen Needle (Stephen Hall) 32G X 6 MM MISC 2 Act by Does not apply route daily. 200 each 1  . Stephen Delica Lancets 24M MISC USE TO CHECK BLOOD GLUCOSE 3 TIMES A DAY 100 each 1  . Stephen Hall test strip USE TO TEST BLOOD GLUCOSE 3 TIMES A DAY 200 strip 1  . Candesartan Cilexetil-HCTZ 32-25 MG TABS TAKE 1 TABLET BY MOUTH DAILY. OVERDUE FOR FOLLOW-UP APPT MUST SEE PROVIDER FOR REFILLS 90 tablet 0  . cyclobenzaprine (FLEXERIL) 5 MG tablet Take 1 tablet (5 mg total) by mouth 3 (three) times daily as needed for muscle spasms. 90 tablet 1  . doxycycline (VIBRAMYCIN) 100 MG capsule Take 1 capsule (100 mg total) by mouth 2 (two) times daily. 20 capsule 0  . ibuprofen (ADVIL) 800 MG tablet Take 1 tablet (800 mg total) by mouth 3  (three) times daily. 21 tablet 0  . rosuvastatin (CRESTOR) 10 MG tablet TAKE 1 TABLET BY MOUTH ONCE A DAY 90 tablet 0   No facility-administered medications prior to visit.    ROS Review of Systems  Constitutional: Positive for unexpected weight change (wt gain). Negative for chills, diaphoresis, fatigue and fever.  Eyes: Negative.   Respiratory: Negative for cough, chest tightness and shortness of breath.   Cardiovascular: Negative for chest pain, palpitations and leg swelling.  Gastrointestinal: Negative for abdominal pain, constipation, diarrhea and nausea.  Endocrine: Negative.   Genitourinary: Negative.  Negative for difficulty urinating.  Musculoskeletal: Negative for myalgias.  Skin: Positive for color change. Negative for rash.  Neurological: Negative.  Negative for dizziness, weakness and light-headedness.  Hematological: Negative for adenopathy. Does not bruise/bleed easily.  Psychiatric/Behavioral: Negative.     Objective:  BP 138/88 (BP Location: Left Arm, Patient Position: Sitting, Cuff Size: Large)   Pulse (!) 102   Temp 98.1 F (36.7 C) (Oral)   Ht 6' 5" (1.956 m)   Wt (!) 347 lb (157.4 kg)   SpO2 96%   BMI 41.15 kg/m   BP Readings from Last 3 Encounters:  08/04/19 138/88  08/02/19 133/89  07/16/19 (!) 144/90    Wt Readings from Last 3 Encounters:  08/04/19 (!) 347 lb (157.4 kg)  07/16/19 (!) 344 lb 9.6 oz (156.3 kg)  04/01/19 (!) 340 lb 12.8  oz (154.6 kg)    Physical Exam Constitutional:      General: He is not in acute distress.    Appearance: He is obese. He is not toxic-appearing or diaphoretic.  HENT:     Nose: Nose normal.     Mouth/Throat:     Mouth: Mucous membranes are moist.  Eyes:     General: No scleral icterus.    Conjunctiva/sclera: Conjunctivae normal.  Cardiovascular:     Rate and Rhythm: Normal rate and regular rhythm.     Heart sounds: No murmur.  Pulmonary:     Effort: Pulmonary effort is normal.     Breath sounds: No  stridor. No wheezing, rhonchi or rales.  Abdominal:     General: Abdomen is protuberant. Bowel sounds are normal. There is no distension.     Palpations: Abdomen is soft. There is no hepatomegaly, splenomegaly or mass.     Tenderness: There is no abdominal tenderness.  Musculoskeletal:        General: Normal range of motion.     Cervical back: Neck supple.     Right lower leg: No edema.     Left lower leg: No edema.     Comments: LRF- There is a paronychia on the dorsal aspect of the distal phalanx on the ulnar side.  The erythema, swelling, and tenderness to extends around onto the volar side of the distal phalanx consistent with a felon.  Lymphadenopathy:     Cervical: No cervical adenopathy.  Skin:    General: Skin is warm and dry.     Findings: Erythema present.  Neurological:     General: No focal deficit present.     Mental Status: He is alert.  Psychiatric:        Mood and Affect: Mood normal.        Behavior: Behavior normal.     Lab Results  Component Value Date   WBC 10.8 (H) 08/04/2019   HGB 14.9 08/04/2019   HCT 45.7 08/04/2019   PLT 273.0 08/04/2019   GLUCOSE 251 (H) 08/04/2019   CHOL 172 08/04/2019   TRIG 151.0 (H) 08/04/2019   HDL 30.80 (L) 08/04/2019   LDLDIRECT 158.1 02/07/2012   LDLCALC 111 (H) 08/04/2019   ALT 16 08/04/2019   AST 10 08/04/2019   NA 135 08/04/2019   K 3.6 08/04/2019   CL 98 08/04/2019   CREATININE 0.98 08/04/2019   BUN 12 08/04/2019   CO2 26 08/04/2019   TSH 1.92 08/04/2019   HGBA1C 11.8 (A) 07/16/2019   MICROALBUR 1.7 11/26/2018    No results found.  Assessment & Plan:   Stephen Hall was seen today for annual exam, diabetes, hypertension and hyperlipidemia.  Diagnoses and all orders for this visit:  Essential hypertension- His blood pressure is adequately well controlled.  Electrolytes and renal function are normal.  Will continue the combination of an ARB and thiazide diuretic. -     CBC with Differential/Platelet -     Basic  metabolic panel -     Candesartan Cilexetil-HCTZ 32-25 MG TABS; Take 1 tablet by mouth daily.  Diabetes mellitus type 2 in obese Stephen Hall)- His blood sugars are not adequately well controlled.  He is working with endocrinology to achieve better glycemic control. -     Hepatic function panel -     Basic metabolic panel -     Ambulatory referral to Ophthalmology -     rosuvastatin (CRESTOR) 10 MG tablet; Take 1 tablet (10 mg total) by  mouth daily. -     Candesartan Cilexetil-HCTZ 32-25 MG TABS; Take 1 tablet by mouth daily.  Vitamin D deficiency disease -     VITAMIN D 25 Hydroxy (Vit-D Deficiency, Fractures) -     Cholecalciferol 50 MCG (2000 UT) TABS; Take 1 tablet (2,000 Units total) by mouth daily.  Hyperlipidemia with target LDL less than 130- He has achieved his LDL goal is doing well on the statin. -     Hepatic function panel -     rosuvastatin (CRESTOR) 10 MG tablet; Take 1 tablet (10 mg total) by mouth daily.  Routine general medical examination at a health care facility- Exam completed, labs reviewed, vaccines reviewed and updated, patient education was given. -     Lipid panel -     HIV Antibody (routine testing w rflx)  Morbid obesity (Stephen Hall)- He agrees to be more diligent with his lifestyle modifications. -     Hepatic function panel -     TSH  Felon of finger of left hand- He has worsening signs and symptoms despite taking doxycycline and ibuprofen.  His white cell count is elevated.  I recommended that he see a hand surgeon to see if this needs to undergo incision and drainage. -     Ambulatory referral to Orthopedic Surgery   I have discontinued Stephen Hall's cyclobenzaprine, doxycycline, and ibuprofen. I have also changed his rosuvastatin and Candesartan Cilexetil-HCTZ. Additionally, I am having him start on Cholecalciferol. Lastly, I am having him maintain his Stephen Hall, Stephen Hall, Stephen Hall, Stephen Hall, Tresiba FlexTouch, and Xigduo  XR.  Meds ordered this encounter  Medications  . Cholecalciferol 50 MCG (2000 UT) TABS    Sig: Take 1 tablet (2,000 Units total) by mouth daily.    Dispense:  90 tablet    Refill:  3  . rosuvastatin (CRESTOR) 10 MG tablet    Sig: Take 1 tablet (10 mg total) by mouth daily.    Dispense:  90 tablet    Refill:  1  . Candesartan Cilexetil-HCTZ 32-25 MG TABS    Sig: Take 1 tablet by mouth daily.    Dispense:  90 tablet    Refill:  1     Follow-up: Return if symptoms worsen or fail to improve.  Scarlette Calico, MD

## 2019-08-05 LAB — HIV ANTIBODY: HIV Ag/Ab Combo: NONREACTIVE

## 2019-08-05 LAB — HIV ANTIBODY (ROUTINE TESTING W REFLEX): HIV 1&2 Ab, 4th Generation: NONREACTIVE

## 2019-08-05 LAB — HM HIV SCREENING LAB: HM HIV Screening: NEGATIVE

## 2019-08-05 MED ORDER — ROSUVASTATIN CALCIUM 10 MG PO TABS: 10.0000 mg | ORAL_TABLET | Freq: Every day | ORAL | 1 refills | Status: DC

## 2019-08-05 MED ORDER — CANDESARTAN CILEXETIL-HCTZ 32-25 MG PO TABS: 1.0000 | ORAL_TABLET | Freq: Every day | ORAL | 1 refills | Status: DC

## 2019-08-05 MED FILL — HYDROCODON-APAP 5-325: 5-325 | 3 days supply | Qty: 25 | Fill #0

## 2019-08-05 MED FILL — CANDESARTAN-HCTZ 32-25MG TA: 32-25 | 30 days supply | Qty: 30 | Fill #0

## 2019-08-05 MED FILL — ROSUVASTATIN CALCIUM 10 MG: 10 | 90 days supply | Qty: 90 | Fill #0

## 2019-08-06 ENCOUNTER — Encounter: Payer: Self-pay | Admitting: Internal Medicine

## 2019-08-11 MED FILL — IBUPROFEN 800 MG TAB: 800 | 10 days supply | Qty: 30 | Fill #0

## 2019-08-18 LAB — HM DIABETES EYE EXAM

## 2019-08-26 ENCOUNTER — Encounter: Payer: Self-pay | Admitting: Internal Medicine

## 2019-09-15 ENCOUNTER — Other Ambulatory Visit: Payer: Self-pay

## 2019-09-17 ENCOUNTER — Other Ambulatory Visit: Payer: Self-pay

## 2019-09-17 ENCOUNTER — Encounter: Payer: Self-pay | Admitting: Endocrinology

## 2019-09-17 ENCOUNTER — Ambulatory Visit: Payer: BC Managed Care – PPO | Admitting: Endocrinology

## 2019-09-17 VITALS — BP 136/88 | HR 100 | Ht 77.0 in | Wt 360.0 lb

## 2019-09-17 DIAGNOSIS — E669 Obesity, unspecified: Secondary | ICD-10-CM

## 2019-09-17 DIAGNOSIS — E1169 Type 2 diabetes mellitus with other specified complication: Secondary | ICD-10-CM

## 2019-09-17 LAB — POCT GLYCOSYLATED HEMOGLOBIN (HGB A1C): Hemoglobin A1C: 10.6 % — AB (ref 4.0–5.6)

## 2019-09-17 MED ORDER — TRESIBA FLEXTOUCH 200 UNIT/ML ~~LOC~~ SOPN: 120.0000 [IU] | PEN_INJECTOR | Freq: Every day | SUBCUTANEOUS | 11 refills | Status: DC

## 2019-09-17 MED FILL — TRESIBA FLEXTOUCH 200 UNITS: 200 | 30 days supply | Qty: 18 | Fill #0

## 2019-09-17 NOTE — Progress Notes (Signed)
 Subjective:    Patient ID: Stephen Hall, male    DOB: 01/17/1981, 39 y.o.   MRN: [id number]  HPI Pt returns for f/u of diabetes mellitus: DM type: Insulin-requiring type 2 Dx'ed: 2015 Complications: none Therapy: insulin since 2017, and 2 oral meds.   DKA: never Severe hypoglycemia: never.   Pancreatitis: never.   SDOH: Pt says he sometimes misses DM meds Other: he declines multiple daily injections; he works as construction supervisor; he is pursuing weight loss surgery; he did not tolerate Soliqua (abd bloating).   Interval history: pt says he has not recently missed insulin doses.  pt states he feels well in general. no cbg record, but states cbg's vary from 102-250.   Past Medical History:  Diagnosis Date  . Asthma   . Diabetes mellitus without complication (HCC)   . Environmental allergies     No past surgical history on file.  Social History   Socioeconomic History  . Marital status: Married    Spouse name: Not on file  . Number of children: Not on file  . Years of education: Not on file  . Highest education level: Not on file  Occupational History  . Not on file  Tobacco Use  . Smoking status: Never Smoker  . Smokeless tobacco: Never Used  Substance and Sexual Activity  . Alcohol use: No  . Drug use: No  . Sexual activity: Yes  Other Topics Concern  . Not on file  Social History Narrative   Caffienated drinks-yes   Seat belt use often-yes   Regular Exercise-no   Smoke alarm in the home-yes   Firearms/guns in the home-no   History of physical abuse-no               Social Determinants of Health   Financial Resource Strain:   . Difficulty of Paying Living Expenses:   Food Insecurity:   . Worried About Running Out of Food in the Last Year:   . Ran Out of Food in the Last Year:   Transportation Needs:   . Lack of Transportation (Medical):   . Lack of Transportation (Non-Medical):   Physical Activity:   . Days of Exercise per Week:   .  Minutes of Exercise per Session:   Stress:   . Feeling of Stress :   Social Connections:   . Frequency of Communication with Friends and Family:   . Frequency of Social Gatherings with Friends and Family:   . Attends Religious Services:   . Active Member of Clubs or Organizations:   . Attends Club or Organization Meetings:   . Marital Status:   Intimate Partner Violence:   . Fear of Current or Ex-Partner:   . Emotionally Abused:   . Physically Abused:   . Sexually Abused:     Current Outpatient Medications on File Prior to Visit  Medication Sig Dispense Refill  . Blood Glucose Monitoring Suppl (ONETOUCH VERIO FLEX SYSTEM) w/Device KIT 1 Act by Does not apply route 3 (three) times daily. 2 kit 0  . Candesartan Cilexetil-HCTZ 32-25 MG TABS Take 1 tablet by mouth daily. 90 tablet 1  . Cholecalciferol 50 MCG (2000 UT) TABS Take 1 tablet (2,000 Units total) by mouth daily. 90 tablet 3  . Dapagliflozin-metFORMIN HCl ER (XIGDUO XR) 10-998 MG TB24 Take 2 tablets by mouth daily. 180 tablet 0  . Insulin Pen Needle (NOVOFINE) 32G X 6 MM MISC 2 Act by Does not apply route daily. 200 each 1  .   OneTouch Delica Lancets 16X MISC USE TO CHECK BLOOD GLUCOSE 3 TIMES A DAY 100 each 1  . ONETOUCH VERIO test strip USE TO TEST BLOOD GLUCOSE 3 TIMES A DAY 200 strip 1  . rosuvastatin (CRESTOR) 10 MG tablet Take 1 tablet (10 mg total) by mouth daily. 90 tablet 1   No current facility-administered medications on file prior to visit.    No Known Allergies  Family History  Problem Relation Age of Onset  . Alcohol abuse Other   . Arthritis Other   . Hypertension Other   . Diabetes Other   . Obesity Mother   . Hypertension Mother   . Obesity Father   . Hypertension Father   . Cancer Neg Hx   . Heart disease Neg Hx   . Stroke Neg Hx   . Kidney disease Neg Hx     BP 136/88   Pulse 100   Ht 6' 5" (1.956 m)   Wt (!) 360 lb (163.3 kg)   SpO2 98%   BMI 42.69 kg/m    Review of Systems He denies  hypoglycemia    Objective:   Physical Exam VITAL SIGNS:  See vs page GENERAL: no distress Pulses: dorsalis pedis intact bilat.   MSK: no deformity of the feet CV: trace bilat leg edema Skin:  no ulcer on the feet.  normal color and temp on the feet. Neuro: sensation is intact to touch on the feet   Lab Results  Component Value Date   HGBA1C 10.6 (A) 09/17/2019       Assessment & Plan:  HTN: is noted today Insulin-requiring type 2 DM: he needs increased rx   Patient Instructions  Your blood pressure is high today.  Please see your primary care provider soon, to have it rechecked.   Please increase the Tresiba to 120 units daily.  This is a slow time-release insulin, so if you miss it, you can make it up when you remember.   Please continue the same Xigduo.   check your blood sugar twice a day.  vary the time of day when you check, between before the 3 meals, and at bedtime.  also check if you have symptoms of your blood sugar being too high or too low.  please keep a record of the readings and bring it to your next appointment here (or you can bring the meter itself).  You can write it on any piece of paper.  please call us sooner if your blood sugar goes below 70, or if you have a lot of readings over 200.   Please come back for a follow-up appointment in 2 months.

## 2019-09-17 NOTE — Patient Instructions (Signed)
Your blood pressure is high today.  Please see your primary care provider soon, to have it rechecked.   Please increase the Tresiba to 120 units daily.  This is a slow time-release insulin, so if you miss it, you can make it up when you remember.   Please continue the same Xigduo.   check your blood sugar twice a day.  vary the time of day when you check, between before the 3 meals, and at bedtime.  also check if you have symptoms of your blood sugar being too high or too low.  please keep a record of the readings and bring it to your next appointment here (or you can bring the meter itself).  You can write it on any piece of paper.  please call us sooner if your blood sugar goes below 70, or if you have a lot of readings over 200.   Please come back for a follow-up appointment in 2 months.

## 2019-09-23 MED FILL — CANDESARTAN-HCTZ 32-25MG TA: 32-25 | 90 days supply | Qty: 90 | Fill #1

## 2019-10-29 MED FILL — TRESIBA FLEXTOUCH 200 UNITS: 200 | 30 days supply | Qty: 18 | Fill #1

## 2019-10-29 MED FILL — ROSUVASTATIN CALCIUM 10 MG: 10 | 90 days supply | Qty: 90 | Fill #1

## 2019-10-29 MED FILL — XIGDUO XR 5 MG-1,000 MG TAB: 5-1000 | 90 days supply | Qty: 180 | Fill #0

## 2019-11-03 MED FILL — BD PEN NDL MINI 31GX5MM: 31G X 5 MM | 50 days supply | Qty: 100 | Fill #0

## 2019-11-19 ENCOUNTER — Ambulatory Visit: Payer: BC Managed Care – PPO | Admitting: Endocrinology

## 2019-11-27 ENCOUNTER — Other Ambulatory Visit: Payer: Self-pay

## 2019-12-02 ENCOUNTER — Ambulatory Visit: Payer: BC Managed Care – PPO | Admitting: Endocrinology

## 2019-12-02 ENCOUNTER — Other Ambulatory Visit: Payer: Self-pay

## 2019-12-02 ENCOUNTER — Encounter: Payer: Self-pay | Admitting: Endocrinology

## 2019-12-02 VITALS — BP 130/80 | HR 75 | Ht 77.0 in | Wt 356.0 lb

## 2019-12-02 DIAGNOSIS — E1169 Type 2 diabetes mellitus with other specified complication: Secondary | ICD-10-CM

## 2019-12-02 DIAGNOSIS — E669 Obesity, unspecified: Secondary | ICD-10-CM

## 2019-12-02 LAB — POCT GLYCOSYLATED HEMOGLOBIN (HGB A1C): Hemoglobin A1C: 10.3 % — AB (ref 4.0–5.6)

## 2019-12-02 MED ORDER — TRESIBA FLEXTOUCH 200 UNIT/ML ~~LOC~~ SOPN: 140.0000 [IU] | PEN_INJECTOR | Freq: Every day | SUBCUTANEOUS | 11 refills | Status: DC

## 2019-12-02 MED FILL — TRESIBA FLEXTOUCH 200 UNITS: 200 | 26 days supply | Qty: 18 | Fill #0

## 2019-12-02 NOTE — Progress Notes (Signed)
Subjective:    Patient ID: Stephen Hall, male    DOB: [birth year], 39 y.o.   MRN: [id number]  HPI Pt returns for f/u of diabetes mellitus: DM type: Insulin-requiring type 2 Dx'ed: 4034 Complications: none Therapy: insulin since 2017, and 2 oral meds.   DKA: never Severe hypoglycemia: never.   Pancreatitis: never.   SDOH: Pt says he sometimes misses DM meds Other: he declines multiple daily injections; he works as Futures trader; he is pursuing weight loss surgery; he did not tolerate Soliqua (abd bloating).   Interval history: pt says he seldom misses the insulin.  pt states he feels well in general. no cbg record, but states cbg's vary from 102-250. Past Medical History:  Diagnosis Date  . Asthma   . Diabetes mellitus without complication ([place])   . Environmental allergies     No past surgical history on file.  Social History   Socioeconomic History  . Marital status: Married    Spouse name: Not on file  . Number of children: Not on file  . Years of education: Not on file  . Highest education level: Not on file  Occupational History  . Not on file  Tobacco Use  . Smoking status: Never Smoker  . Smokeless tobacco: Never Used  Substance and Sexual Activity  . Alcohol use: No  . Drug use: No  . Sexual activity: Yes  Other Topics Concern  . Not on file  Social History Narrative   Caffienated drinks-yes   Seat belt use often-yes   Regular Exercise-no   Smoke alarm in the home-yes   Firearms/guns in the home-no   History of physical abuse-no               Social Determinants of Health   Financial Resource Strain:   . Difficulty of Paying Living Expenses:   Food Insecurity:   . Worried About Charity fundraiser in the Last Year:   . Arboriculturist in the Last Year:   Transportation Needs:   . Film/video editor (Medical):   Marland Kitchen Lack of Transportation (Non-Medical):   Physical Activity:   . Days of Exercise per Week:   . Minutes of Exercise  per Session:   Stress:   . Feeling of Stress :   Social Connections:   . Frequency of Communication with Friends and Family:   . Frequency of Social Gatherings with Friends and Family:   . Attends Religious Services:   . Active Member of Clubs or Organizations:   . Attends Archivist Meetings:   Marland Kitchen Marital Status:   Intimate Partner Violence:   . Fear of Current or Ex-Partner:   . Emotionally Abused:   Marland Kitchen Physically Abused:   . Sexually Abused:     Current Outpatient Medications on File Prior to Visit  Medication Sig Dispense Refill  . Blood Glucose Monitoring Suppl (ONETOUCH VERIO FLEX SYSTEM) w/Device KIT 1 Act by Does not apply route 3 (three) times daily. 2 kit 0  . Candesartan Cilexetil-HCTZ 32-25 MG TABS Take 1 tablet by mouth daily. 90 tablet 1  . Cholecalciferol 50 MCG (2000 UT) TABS Take 1 tablet (2,000 Units total) by mouth daily. 90 tablet 3  . Dapagliflozin-metFORMIN HCl ER (XIGDUO XR) 10-998 MG TB24 Take 2 tablets by mouth daily. 180 tablet 0  . Insulin Pen Needle (NOVOFINE) 32G X 6 MM MISC 2 Act by Does not apply route daily. 200 each 1  . OneTouch Delica Lancets 74Q  MISC USE TO CHECK BLOOD GLUCOSE 3 TIMES A DAY 100 each 1  . ONETOUCH VERIO test strip USE TO TEST BLOOD GLUCOSE 3 TIMES A DAY 200 strip 1  . rosuvastatin (CRESTOR) 10 MG tablet Take 1 tablet (10 mg total) by mouth daily. 90 tablet 1   No current facility-administered medications on file prior to visit.    No Known Allergies  Family History  Problem Relation Age of Onset  . Alcohol abuse Other   . Arthritis Other   . Hypertension Other   . Diabetes Other   . Obesity Mother   . Hypertension Mother   . Obesity Father   . Hypertension Father   . Cancer Neg Hx   . Heart disease Neg Hx   . Stroke Neg Hx   . Kidney disease Neg Hx     BP 130/80   Pulse 75   Ht '6\' 5"'  (1.956 m)   Wt (!) 356 lb (161.5 kg)   SpO2 97%   BMI 42.22 kg/m    Review of Systems He denies hypoglycemia.        Objective:   Physical Exam VITAL SIGNS:  See vs page GENERAL: no distress Pulses: dorsalis pedis intact bilat.   MSK: no deformity of the feet CV: trace bilat leg edema Skin:  no ulcer on the feet.  normal color and temp on the feet. Neuro: sensation is intact to touch on the feet     Lab Results  Component Value Date   HGBA1C 10.3 (A) 12/02/2019       Assessment & Plan:  Type 2 DM: he needs increased rx   Patient Instructions  Please increase the Tresiba to 140 units daily.  This is a slow time-release insulin, so if you miss it, you can make it up when you remember.   Please continue the same Xigduo.   check your blood sugar twice a day.  vary the time of day when you check, between before the 3 meals, and at bedtime.  also check if you have symptoms of your blood sugar being too high or too low.  please keep a record of the readings and bring it to your next appointment here (or you can bring the meter itself).  You can write it on any piece of paper.  please call us sooner if your blood sugar goes below 70, or if you have a lot of readings over 200.   Please come back for a follow-up appointment in 2-3 months.

## 2019-12-02 NOTE — Patient Instructions (Addendum)
Please increase the Tresiba to 140 units daily.  This is a slow time-release insulin, so if you miss it, you can make it up when you remember.   Please continue the same Xigduo.   check your blood sugar twice a day.  vary the time of day when you check, between before the 3 meals, and at bedtime.  also check if you have symptoms of your blood sugar being too high or too low.  please keep a record of the readings and bring it to your next appointment here (or you can bring the meter itself).  You can write it on any piece of paper.  please call us sooner if your blood sugar goes below 70, or if you have a lot of readings over 200.   Please come back for a follow-up appointment in 2-3 months.

## 2019-12-09 ENCOUNTER — Other Ambulatory Visit: Payer: Self-pay

## 2019-12-09 ENCOUNTER — Ambulatory Visit (INDEPENDENT_AMBULATORY_CARE_PROVIDER_SITE_OTHER): Payer: BC Managed Care – PPO | Admitting: Internal Medicine

## 2019-12-09 ENCOUNTER — Encounter: Payer: Self-pay | Admitting: Internal Medicine

## 2019-12-09 VITALS — BP 142/100 | HR 112 | Temp 98.4°F | Resp 16 | Ht 77.0 in | Wt 351.0 lb

## 2019-12-09 DIAGNOSIS — I10 Essential (primary) hypertension: Secondary | ICD-10-CM

## 2019-12-09 DIAGNOSIS — E669 Obesity, unspecified: Secondary | ICD-10-CM | POA: Diagnosis not present

## 2019-12-09 DIAGNOSIS — E1169 Type 2 diabetes mellitus with other specified complication: Secondary | ICD-10-CM | POA: Diagnosis not present

## 2019-12-09 LAB — MICROALBUMIN / CREATININE URINE RATIO
Creatinine,U: 46.3 mg/dL
Microalb Creat Ratio: 6.8 mg/g (ref 0.0–30.0)
Microalb, Ur: 3.2 mg/dL — ABNORMAL HIGH (ref 0.0–1.9)

## 2019-12-09 MED ORDER — NEBIVOLOL HCL 10 MG PO TABS: 10.0000 mg | ORAL_TABLET | Freq: Every day | ORAL | 0 refills | Status: DC

## 2019-12-09 NOTE — Patient Instructions (Signed)

## 2019-12-09 NOTE — Progress Notes (Signed)
Subjective:  Patient ID: Stephen Hall, male    DOB: Dec 28, 1980  Age: 39 y.o. MRN: [id number]  CC: Hypertension and Diabetes  This visit occurred during the SARS-CoV-2 public health emergency.  Safety protocols were in place, including screening questions prior to the visit, additional usage of staff PPE, and extensive cleaning of exam room while observing appropriate contact time as indicated for disinfecting solutions.    HPI Stephen Hall presents for f/up - He tells me he has felt well recently and offers no complaints.  He does not routinely monitor his blood pressure and blood sugar.  He tells me he is compliant with the ARB and thiazide diuretic.  He is working on his lifestyle modifications.  He denies any recent episodes of headache, blurred vision, chest pain, shortness of breath, edema, palpitations, or fatigue.  Outpatient Medications Prior to Visit  Medication Sig Dispense Refill  . Blood Glucose Monitoring Suppl (ONETOUCH VERIO FLEX SYSTEM) w/Device KIT 1 Act by Does not apply route 3 (three) times daily. 2 kit 0  . Candesartan Cilexetil-HCTZ 32-25 MG TABS Take 1 tablet by mouth daily. 90 tablet 1  . Cholecalciferol 50 MCG (2000 UT) TABS Take 1 tablet (2,000 Units total) by mouth daily. 90 tablet 3  . Dapagliflozin-metFORMIN HCl ER (XIGDUO XR) 10-998 MG TB24 Take 2 tablets by mouth daily. 180 tablet 0  . insulin degludec (TRESIBA FLEXTOUCH) 200 UNIT/ML FlexTouch Pen Inject 140 Units into the skin daily. 6 pen 11  . Insulin Pen Needle (NOVOFINE) 32G X 6 MM MISC 2 Act by Does not apply route daily. 200 each 1  . OneTouch Delica Lancets 56L MISC USE TO CHECK BLOOD GLUCOSE 3 TIMES A DAY 100 each 1  . ONETOUCH VERIO test strip USE TO TEST BLOOD GLUCOSE 3 TIMES A DAY 200 strip 1  . rosuvastatin (CRESTOR) 10 MG tablet Take 1 tablet (10 mg total) by mouth daily. 90 tablet 1   No facility-administered medications prior to visit.    ROS Review of Systems  Constitutional: Negative  for appetite change, diaphoresis and fatigue.  HENT: Negative.   Eyes: Negative for visual disturbance.  Respiratory: Negative for cough, chest tightness, shortness of breath and wheezing.   Cardiovascular: Negative for chest pain, palpitations and leg swelling.  Gastrointestinal: Negative for abdominal pain, constipation, diarrhea, nausea and vomiting.  Endocrine: Negative.  Negative for polydipsia, polyphagia and polyuria.  Genitourinary: Negative.  Negative for difficulty urinating and dysuria.  Musculoskeletal: Negative for arthralgias and myalgias.  Neurological: Negative.  Negative for dizziness, weakness, light-headedness and headaches.  Hematological: Negative for adenopathy. Does not bruise/bleed easily.  Psychiatric/Behavioral: Negative.     Objective:  BP (!) 142/100 (BP Location: Left Arm, Patient Position: Sitting, Cuff Size: Large)   Pulse (!) 112   Temp 98.4 F (36.9 C) (Oral)   Resp 16   Ht '6\' 5"'  (1.956 m)   Wt (!) 351 lb (159.2 kg)   SpO2 98%   BMI 41.62 kg/m   BP Readings from Last 3 Encounters:  12/09/19 (!) 142/100  12/02/19 130/80  09/17/19 136/88    Wt Readings from Last 3 Encounters:  12/09/19 (!) 351 lb (159.2 kg)  12/02/19 (!) 356 lb (161.5 kg)  09/17/19 (!) 360 lb (163.3 kg)    Physical Exam Constitutional:      Appearance: He is obese.  HENT:     Nose: Nose normal.     Mouth/Throat:     Mouth: Mucous membranes are moist.  Eyes:  General: No scleral icterus.    Conjunctiva/sclera: Conjunctivae normal.  Cardiovascular:     Rate and Rhythm: Regular rhythm. Tachycardia present.     Pulses: Normal pulses.     Heart sounds: No murmur heard.  No gallop.   Pulmonary:     Effort: Pulmonary effort is normal.     Breath sounds: No stridor. No wheezing, rhonchi or rales.  Abdominal:     General: Abdomen is protuberant. Bowel sounds are normal. There is no distension.     Palpations: Abdomen is soft. There is no hepatomegaly, splenomegaly or  mass.     Tenderness: There is no abdominal tenderness.  Musculoskeletal:        General: Normal range of motion.     Cervical back: Neck supple.     Right lower leg: No edema.     Left lower leg: No edema.  Skin:    General: Skin is warm and dry.  Neurological:     General: No focal deficit present.     Mental Status: He is alert.  Psychiatric:        Mood and Affect: Mood normal.        Behavior: Behavior normal.     Lab Results  Component Value Date   WBC 10.8 (H) 08/04/2019   HGB 14.9 08/04/2019   HCT 45.7 08/04/2019   PLT 273.0 08/04/2019   GLUCOSE 251 (H) 08/04/2019   CHOL 172 08/04/2019   TRIG 151.0 (H) 08/04/2019   HDL 30.80 (L) 08/04/2019   LDLDIRECT 158.1 02/07/2012   LDLCALC 111 (H) 08/04/2019   ALT 16 08/04/2019   AST 10 08/04/2019   NA 135 08/04/2019   K 3.6 08/04/2019   CL 98 08/04/2019   CREATININE 0.98 08/04/2019   BUN 12 08/04/2019   CO2 26 08/04/2019   TSH 1.92 08/04/2019   HGBA1C 10.3 (A) 12/02/2019   MICROALBUR 3.2 (H) 12/09/2019    No results found.  Assessment & Plan:   Stephen Hall was seen today for hypertension and diabetes.  Diagnoses and all orders for this visit:  Essential hypertension- His blood pressure is not adequately well controlled and he is tachycardic.  Will add nebivolol to the combination of the ARB and thiazide diuretic. -     nebivolol (BYSTOLIC) 10 MG tablet; Take 1 tablet (10 mg total) by mouth daily.  Diabetes mellitus type 2 in obese [facility])- His recent A1c was 10.3%.  He is working with endocrinology to get better control of his blood sugars. -     Microalbumin / creatinine urine ratio; Future -     Microalbumin / creatinine urine ratio -     HM Diabetes Foot Exam   I am having Janett Billow start on nebivolol. I am also having him maintain his [employer], NovoFine, OneTouch Verio, OneTouch Delica Lancets 63F, Xigduo XR, Cholecalciferol, rosuvastatin, Candesartan Cilexetil-HCTZ, and [employer].  Meds ordered this encounter  Medications  . nebivolol (BYSTOLIC) 10 MG tablet    Sig: Take 1 tablet (10 mg total) by mouth daily.    Dispense:  84 tablet    Refill:  0     Follow-up: Return in about 2 months (around 02/08/2020).  Scarlette Calico, MD

## 2019-12-17 ENCOUNTER — Encounter: Payer: Self-pay | Admitting: Internal Medicine

## 2020-01-18 MED FILL — TRESIBA FLEXTOUCH 200 UNITS: 200 | 26 days supply | Qty: 18 | Fill #1

## 2020-02-03 ENCOUNTER — Ambulatory Visit: Payer: BC Managed Care – PPO | Admitting: Endocrinology

## 2020-02-16 ENCOUNTER — Ambulatory Visit: Payer: BC Managed Care – PPO | Admitting: Internal Medicine

## 2020-02-18 ENCOUNTER — Ambulatory Visit: Payer: BC Managed Care – PPO | Admitting: Endocrinology

## 2020-02-23 ENCOUNTER — Other Ambulatory Visit: Payer: Self-pay | Admitting: Endocrinology

## 2020-02-23 ENCOUNTER — Other Ambulatory Visit: Payer: Self-pay | Admitting: Internal Medicine

## 2020-02-23 DIAGNOSIS — E669 Obesity, unspecified: Secondary | ICD-10-CM

## 2020-02-23 DIAGNOSIS — I10 Essential (primary) hypertension: Secondary | ICD-10-CM

## 2020-02-23 MED FILL — BD PEN NDL MINI 31GX5MM: 31G X 5 MM | 90 days supply | Qty: 100 | Fill #0

## 2020-02-23 MED FILL — TRESIBA FLEXTOUCH 200 UNITS: 200 | 26 days supply | Qty: 18 | Fill #2

## 2020-02-23 MED FILL — CANDESARTAN-HCTZ 32-25MG TA: 32-25 | 90 days supply | Qty: 90 | Fill #0

## 2020-03-10 ENCOUNTER — Ambulatory Visit: Payer: BC Managed Care – PPO | Admitting: Internal Medicine

## 2020-03-16 ENCOUNTER — Encounter: Payer: Self-pay | Admitting: Internal Medicine

## 2020-03-16 ENCOUNTER — Other Ambulatory Visit: Payer: Self-pay

## 2020-03-16 ENCOUNTER — Ambulatory Visit (INDEPENDENT_AMBULATORY_CARE_PROVIDER_SITE_OTHER): Payer: BC Managed Care – PPO | Admitting: Internal Medicine

## 2020-03-16 VITALS — BP 134/82 | HR 86 | Temp 98.7°F | Resp 16 | Ht 77.0 in | Wt 350.0 lb

## 2020-03-16 DIAGNOSIS — E669 Obesity, unspecified: Secondary | ICD-10-CM

## 2020-03-16 DIAGNOSIS — Z23 Encounter for immunization: Secondary | ICD-10-CM | POA: Diagnosis not present

## 2020-03-16 DIAGNOSIS — I10 Essential (primary) hypertension: Secondary | ICD-10-CM | POA: Diagnosis not present

## 2020-03-16 DIAGNOSIS — Z1159 Encounter for screening for other viral diseases: Secondary | ICD-10-CM | POA: Insufficient documentation

## 2020-03-16 DIAGNOSIS — E1169 Type 2 diabetes mellitus with other specified complication: Secondary | ICD-10-CM | POA: Diagnosis not present

## 2020-03-16 NOTE — Patient Instructions (Signed)

## 2020-03-16 NOTE — Progress Notes (Signed)
Subjective:  Patient ID: Stephen Hall, male    DOB: 01/05/1981  Age: 39 y.o. MRN: [id number]  CC: Hypertension and Diabetes  This visit occurred during the SARS-CoV-2 public health emergency.  Safety protocols were in place, including screening questions prior to the visit, additional usage of staff PPE, and extensive cleaning of exam room while observing appropriate contact time as indicated for disinfecting solutions.    HPI Stephen Hall presents for f/up - He continues to complain of polys but it sounds like he is not monitoring his blood sugar.  He tells me he is compliant with all the listed medications.  Outpatient Medications Prior to Visit  Medication Sig Dispense Refill   Blood Glucose Monitoring Suppl ([place]) w/Device KIT 1 Act by Does not apply route 3 (three) times daily. 2 kit 0   Candesartan Cilexetil-HCTZ 32-25 MG TABS TAKE 1 TABLET BY MOUTH ONCE DAILY 90 tablet 0   Cholecalciferol 50 MCG (2000 UT) TABS Take 1 tablet (2,000 Units total) by mouth daily. 90 tablet 3   Dapagliflozin-metFORMIN HCl ER (XIGDUO XR) 10-998 MG TB24 Take 2 tablets by mouth daily. 180 tablet 0   insulin degludec (TRESIBA FLEXTOUCH) 200 UNIT/ML FlexTouch Pen Inject 140 Units into the skin daily. 6 pen 11   Insulin Pen Needle (B-D UF III MINI PEN NEEDLES) 31G X 5 MM MISC 1 each by Other route daily. 100 each 0   Insulin Pen Needle (NOVOFINE) 32G X 6 MM MISC 2 Act by Does not apply route daily. 200 each 1   OneTouch Delica Lancets 37J MISC USE TO CHECK BLOOD GLUCOSE 3 TIMES A DAY 100 each 1   ONETOUCH VERIO test strip USE TO TEST BLOOD GLUCOSE 3 TIMES A DAY 200 strip 1   rosuvastatin (CRESTOR) 10 MG tablet Take 1 tablet (10 mg total) by mouth daily. 90 tablet 1   nebivolol (BYSTOLIC) 10 MG tablet Take 1 tablet (10 mg total) by mouth daily. 84 tablet 0   No facility-administered medications prior to visit.    ROS Review of Systems  Constitutional: Negative for  appetite change, diaphoresis and fatigue.  HENT: Negative.  Negative for trouble swallowing.   Eyes: Negative for visual disturbance.  Respiratory: Negative for cough, chest tightness, shortness of breath and wheezing.   Cardiovascular: Negative for chest pain, palpitations and leg swelling.  Gastrointestinal: Negative for abdominal pain, constipation, diarrhea and nausea.  Endocrine: Positive for polydipsia and polyuria. Negative for polyphagia.  Genitourinary: Negative.  Negative for difficulty urinating.  Musculoskeletal: Negative.   Skin: Negative.   Neurological: Negative for dizziness, weakness, light-headedness and headaches.  Hematological: Negative for adenopathy. Does not bruise/bleed easily.  Psychiatric/Behavioral: Negative.     Objective:  BP 134/82    Pulse 86    Temp 98.7 F (37.1 C) (Oral)    Resp 16    Ht _0  (1.956 m)    Wt (!) 350 lb (158.8 kg)    SpO2 97%    BMI 41.50 kg/m   BP Readings from Last 3 Encounters:  03/16/20 134/82  12/09/19 (!) 142/100  12/02/19 130/80    Wt Readings from Last 3 Encounters:  03/16/20 (!) 350 lb (158.8 kg)  12/09/19 (!) 351 lb (159.2 kg)  12/02/19 (!) 356 lb (161.5 kg)    Physical Exam Vitals reviewed.  Constitutional:      Appearance: Normal appearance.  HENT:     Nose: Nose normal.     Mouth/Throat:  Mouth: Mucous membranes are moist.  Eyes:     General: No scleral icterus.    Conjunctiva/sclera: Conjunctivae normal.  Cardiovascular:     Rate and Rhythm: Normal rate and regular rhythm.     Heart sounds: No murmur heard.   Pulmonary:     Effort: Pulmonary effort is normal.     Breath sounds: No stridor. No wheezing, rhonchi or rales.  Abdominal:     General: Abdomen is protuberant. Bowel sounds are normal. There is no distension.     Palpations: Abdomen is soft. There is no hepatomegaly, splenomegaly or mass.  Musculoskeletal:        General: Normal range of motion.     Cervical back: Neck supple.     Right  lower leg: No edema.     Left lower leg: No edema.  Lymphadenopathy:     Cervical: No cervical adenopathy.  Skin:    General: Skin is warm and dry.  Neurological:     General: No focal deficit present.     Mental Status: He is alert.  Psychiatric:        Mood and Affect: Mood normal.        Behavior: Behavior normal.     Lab Results  Component Value Date   WBC 7.0 03/16/2020   HGB 15.4 03/16/2020   HCT 47.5 03/16/2020   PLT 260 03/16/2020   GLUCOSE 284 (H) 03/16/2020   CHOL 172 08/04/2019   TRIG 151.0 (H) 08/04/2019   HDL 30.80 (L) 08/04/2019   LDLDIRECT 158.1 02/07/2012   LDLCALC 111 (H) 08/04/2019   ALT 16 08/04/2019   AST 10 08/04/2019   NA 134 (L) 03/16/2020   K 4.4 03/16/2020   CL 100 03/16/2020   CREATININE 0.95 03/16/2020   BUN 12 03/16/2020   CO2 23 03/16/2020   TSH 1.92 08/04/2019   HGBA1C 11.3 (H) 03/16/2020   MICROALBUR 3.2 (H) 12/09/2019    No results found.  Assessment & Plan:   [name] was seen today for hypertension and diabetes.  Diagnoses and all orders for this visit:  Essential hypertension- His blood pressure is adequately well controlled.  He has dilutional hyponatremia but the remainder of the kidney function electrolytes are normal. -     CBC with Differential/Platelet; Future -     BASIC METABOLIC PANEL WITH GFR; Future -     BASIC METABOLIC PANEL WITH GFR -     CBC with Differential/Platelet  Diabetes mellitus type 2 in obese ([place])- His A1c is up to 11.3%.  He will see his endocrinologist later this week to discuss remedies to get better control of his blood sugar. -     BASIC METABOLIC PANEL WITH GFR; Future -     Hemoglobin A1c; Future -     Hemoglobin A1c -     BASIC METABOLIC PANEL WITH GFR  Need for hepatitis C screening test -     Hepatitis C antibody; Future -     Hepatitis C antibody  Other orders -     Pneumococcal conjugate vaccine 13-valent   I have discontinued [name]'s nebivolol. I am also having him  maintain his [employer], NovoFine, OneTouch Verio, OneTouch Delica Lancets 23X, Xigduo XR, Cholecalciferol, rosuvastatin, Tresiba FlexTouch, B-D UF III MINI PEN NEEDLES, and Candesartan Cilexetil-HCTZ.  No orders of the defined types were placed in this encounter.    Follow-up: Return in about 6 months (around 09/13/2020).  Scarlette Calico, MD

## 2020-03-17 LAB — CBC WITH DIFFERENTIAL/PLATELET
Absolute Monocytes: 560 cells/uL (ref 200–950)
Basophils Absolute: 49 cells/uL (ref 0–200)
Basophils Relative: 0.7 %
Eosinophils Absolute: 133 cells/uL (ref 15–500)
Eosinophils Relative: 1.9 %
HCT: 47.5 % (ref 38.5–50.0)
Hemoglobin: 15.4 g/dL (ref 13.2–17.1)
Lymphs Abs: 2240 cells/uL (ref 850–3900)
MCH: 25.8 pg — ABNORMAL LOW (ref 27.0–33.0)
MCHC: 32.4 g/dL (ref 32.0–36.0)
MCV: 79.6 fL — ABNORMAL LOW (ref 80.0–100.0)
MPV: 12.2 fL (ref 7.5–12.5)
Monocytes Relative: 8 %
Neutro Abs: 4018 cells/uL (ref 1500–7800)
Neutrophils Relative %: 57.4 %
Platelets: 260 10*3/uL (ref 140–400)
RBC: 5.97 10*6/uL — ABNORMAL HIGH (ref 4.20–5.80)
RDW: 14.4 % (ref 11.0–15.0)
Total Lymphocyte: 32 %
WBC: 7 10*3/uL (ref 3.8–10.8)

## 2020-03-17 LAB — HEMOGLOBIN A1C
Hgb A1c MFr Bld: 11.3 % of total Hgb — ABNORMAL HIGH (ref <5.7)
Mean Plasma Glucose: 278 (calc)
eAG (mmol/L): 15.4 (calc)

## 2020-03-17 LAB — BASIC METABOLIC PANEL WITH GFR
BUN: 12 mg/dL (ref 7–25)
CO2: 23 mmol/L (ref 20–32)
Calcium: 9.5 mg/dL (ref 8.6–10.3)
Chloride: 100 mmol/L (ref 98–110)
Creat: 0.95 mg/dL (ref 0.60–1.35)
GFR, Est African American: 118 mL/min/{1.73_m2} (ref >=60)
GFR, Est Non African American: 102 mL/min/{1.73_m2} (ref >=60)
Glucose, Bld: 284 mg/dL — ABNORMAL HIGH (ref 65–99)
Potassium: 4.4 mmol/L (ref 3.5–5.3)
Sodium: 134 mmol/L — ABNORMAL LOW (ref 135–146)

## 2020-03-17 LAB — HEPATITIS C ANTIBODY
Hepatitis C Ab: NONREACTIVE
SIGNAL TO CUT-OFF: 0.01 (ref <1.00)

## 2020-03-18 ENCOUNTER — Ambulatory Visit: Payer: BC Managed Care – PPO | Admitting: Endocrinology

## 2020-03-18 ENCOUNTER — Other Ambulatory Visit: Payer: Self-pay

## 2020-03-18 VITALS — BP 138/84 | HR 90 | Ht 77.0 in | Wt 354.0 lb

## 2020-03-18 DIAGNOSIS — E1169 Type 2 diabetes mellitus with other specified complication: Secondary | ICD-10-CM | POA: Diagnosis not present

## 2020-03-18 DIAGNOSIS — E669 Obesity, unspecified: Secondary | ICD-10-CM

## 2020-03-18 MED ORDER — XIGDUO XR 5-1000 MG PO TB24: 2.0000 | ORAL_TABLET | Freq: Every day | ORAL | 3 refills | Status: DC

## 2020-03-18 MED ORDER — TRESIBA FLEXTOUCH 200 UNIT/ML ~~LOC~~ SOPN: 170.0000 [IU] | PEN_INJECTOR | Freq: Every day | SUBCUTANEOUS | 3 refills | Status: DC

## 2020-03-18 MED FILL — TRESIBA FLEXTOUCH 200 UNITS: 200 | 88 days supply | Qty: 75 | Fill #0

## 2020-03-18 MED FILL — XIGDUO XR 5 MG-1,000 MG TAB: 5-1000 | 90 days supply | Qty: 180 | Fill #0

## 2020-03-18 NOTE — Progress Notes (Signed)
Subjective:    Patient ID: Stephen Hall, male    DOB: Jan 25, 1981, 39 y.o.   MRN: [id number]  HPI Pt returns for f/u of diabetes mellitus: DM type: Insulin-requiring type 2 Dx'ed: 4975 Complications: none Therapy: insulin since 2017, and 2 oral meds.   DKA: never Severe hypoglycemia: never.   Pancreatitis: never.   SDOH: Pt says he sometimes misses DM meds Other: he declines multiple daily injections; he works as Futures trader; he is pursuing weight loss surgery; he did not tolerate Soliqua (abd bloating).   Interval history: pt says he misses the insulin approx twice per week.  He has a $0 copay.  pt states he feels well in general. no cbg record, but states cbg's vary from 150-271.   Past Medical History:  Diagnosis Date  . Asthma   . Diabetes mellitus without complication ([place])   . Environmental allergies     No past surgical history on file.  Social History   Socioeconomic History  . Marital status: Married    Spouse name: Not on file  . Number of children: Not on file  . Years of education: Not on file  . Highest education level: Not on file  Occupational History  . Not on file  Tobacco Use  . Smoking status: Never Smoker  . Smokeless tobacco: Never Used  Substance and Sexual Activity  . Alcohol use: No  . Drug use: No  . Sexual activity: Yes  Other Topics Concern  . Not on file  Social History Narrative   Caffienated drinks-yes   Seat belt use often-yes   Regular Exercise-no   Smoke alarm in the home-yes   Firearms/guns in the home-no   History of physical abuse-no               Social Determinants of Health   Financial Resource Strain:   . Difficulty of Paying Living Expenses: Not on file  Food Insecurity:   . Worried About Charity fundraiser in the Last Year: Not on file  . Ran Out of Food in the Last Year: Not on file  Transportation Needs:   . Lack of Transportation (Medical): Not on file  . Lack of Transportation  (Non-Medical): Not on file  Physical Activity:   . Days of Exercise per Week: Not on file  . Minutes of Exercise per Session: Not on file  Stress:   . Feeling of Stress : Not on file  Social Connections:   . Frequency of Communication with Friends and Family: Not on file  . Frequency of Social Gatherings with Friends and Family: Not on file  . Attends Religious Services: Not on file  . Active Member of Clubs or Organizations: Not on file  . Attends Archivist Meetings: Not on file  . Marital Status: Not on file  Intimate Partner Violence:   . Fear of Current or Ex-Partner: Not on file  . Emotionally Abused: Not on file  . Physically Abused: Not on file  . Sexually Abused: Not on file    Current Outpatient Medications on File Prior to Visit  Medication Sig Dispense Refill  . Blood Glucose Monitoring Suppl (ONETOUCH VERIO FLEX SYSTEM) w/Device KIT 1 Act by Does not apply route 3 (three) times daily. 2 kit 0  . Candesartan Cilexetil-HCTZ 32-25 MG TABS TAKE 1 TABLET BY MOUTH ONCE DAILY 90 tablet 0  . Cholecalciferol 50 MCG (2000 UT) TABS Take 1 tablet (2,000 Units total) by mouth daily. 90 tablet  3  . Insulin Pen Needle (B-D UF III MINI PEN NEEDLES) 31G X 5 MM MISC 1 each by Other route daily. 100 each 0  . Insulin Pen Needle (NOVOFINE) 32G X 6 MM MISC 2 Act by Does not apply route daily. 200 each 1  . OneTouch Delica Lancets 11W MISC USE TO CHECK BLOOD GLUCOSE 3 TIMES A DAY 100 each 1  . ONETOUCH VERIO test strip USE TO TEST BLOOD GLUCOSE 3 TIMES A DAY 200 strip 1  . rosuvastatin (CRESTOR) 10 MG tablet Take 1 tablet (10 mg total) by mouth daily. 90 tablet 1   No current facility-administered medications on file prior to visit.    No Known Allergies  Family History  Problem Relation Age of Onset  . Alcohol abuse Other   . Arthritis Other   . Hypertension Other   . Diabetes Other   . Obesity Mother   . Hypertension Mother   . Obesity Father   . Hypertension Father     . Cancer Neg Hx   . Heart disease Neg Hx   . Stroke Neg Hx   . Kidney disease Neg Hx     BP 138/84   Pulse 90   Ht '6\' 5"'  (1.956 m)   Wt (!) 354 lb (160.6 kg)   SpO2 95%   BMI 41.98 kg/m    Review of Systems He denies hypoglycemia    Objective:   Physical Exam VITAL SIGNS:  See vs page GENERAL: no distress Pulses: dorsalis pedis intact bilat.   MSK: no deformity of the feet CV: trace bilat leg edema Skin:  no ulcer on the feet.  normal color and temp on the feet. Neuro: sensation is intact to touch on the feet   Lab Results  Component Value Date   HGBA1C 11.3 (H) 03/16/2020       Assessment & Plan:  Insulin-requiring type 2 DM: uncontrolled.  Patient Instructions  Please increase the Tresiba to 170 units daily.  This is a slow time-release insulin, so if you miss it, you can make it up when you remember.   Please continue the same Xigduo.   check your blood sugar twice a day.  vary the time of day when you check, between before the 3 meals, and at bedtime.  also check if you have symptoms of your blood sugar being too high or too low.  please keep a record of the readings and bring it to your next appointment here (or you can bring the meter itself).  You can write it on any piece of paper.  please call us sooner if your blood sugar goes below 70, or if you have a lot of readings over 200.   Please come back for a follow-up appointment in 2 months.

## 2020-03-18 NOTE — Patient Instructions (Addendum)
Please increase the Tresiba to 170 units daily.  This is a slow time-release insulin, so if you miss it, you can make it up when you remember.   Please continue the same Xigduo.   check your blood sugar twice a day.  vary the time of day when you check, between before the 3 meals, and at bedtime.  also check if you have symptoms of your blood sugar being too high or too low.  please keep a record of the readings and bring it to your next appointment here (or you can bring the meter itself).  You can write it on any piece of paper.  please call us sooner if your blood sugar goes below 70, or if you have a lot of readings over 200.   Please come back for a follow-up appointment in 2 months.

## 2020-04-18 MED FILL — TRESIBA FLEXTOUCH 200 UNITS: 200 | 28 days supply | Qty: 24 | Fill #0

## 2020-05-11 ENCOUNTER — Other Ambulatory Visit: Payer: Self-pay | Admitting: Internal Medicine

## 2020-05-11 ENCOUNTER — Encounter: Payer: Self-pay | Admitting: Endocrinology

## 2020-05-11 ENCOUNTER — Other Ambulatory Visit: Payer: Self-pay

## 2020-05-11 ENCOUNTER — Ambulatory Visit: Payer: BC Managed Care – PPO | Admitting: Endocrinology

## 2020-05-11 VITALS — BP 128/82 | HR 95 | Wt 353.0 lb

## 2020-05-11 DIAGNOSIS — E1169 Type 2 diabetes mellitus with other specified complication: Secondary | ICD-10-CM | POA: Diagnosis not present

## 2020-05-11 DIAGNOSIS — E669 Obesity, unspecified: Secondary | ICD-10-CM | POA: Diagnosis not present

## 2020-05-11 DIAGNOSIS — I10 Essential (primary) hypertension: Secondary | ICD-10-CM

## 2020-05-11 LAB — POCT GLYCOSYLATED HEMOGLOBIN (HGB A1C): Hemoglobin A1C: 10.5 % — AB (ref 4.0–5.6)

## 2020-05-11 MED ORDER — XIGDUO XR 5-1000 MG PO TB24: 2.0000 | ORAL_TABLET | Freq: Every day | ORAL | 3 refills | Status: DC

## 2020-05-11 MED ORDER — TRESIBA FLEXTOUCH 200 UNIT/ML ~~LOC~~ SOPN: 180.0000 [IU] | PEN_INJECTOR | Freq: Every day | SUBCUTANEOUS | 3 refills | Status: DC

## 2020-05-11 MED FILL — TRESIBA FLEXTOUCH 200 UNITS: 200 | 30 days supply | Qty: 27 | Fill #0

## 2020-05-11 MED FILL — XIGDUO XR 5 MG-1,000 MG TAB: 5-1000 | 90 days supply | Qty: 180 | Fill #0

## 2020-05-11 MED FILL — CANDESARTAN-HCTZ 32-25MG TA: 32-25 | 90 days supply | Qty: 90 | Fill #0

## 2020-05-11 NOTE — Progress Notes (Signed)
Subjective:    Patient ID: Stephen Hall, male    DOB: 01/25/1981, 39 y.o.   MRN: [id number]  HPI Pt returns for f/u of diabetes mellitus: DM type: Insulin-requiring type 2 Dx'ed: 7412 Complications: none Therapy: insulin since 2017, and 2 oral meds.   DKA: never Severe hypoglycemia: never.   Pancreatitis: never.   SDOH: Pt says he sometimes misses DM meds.   Other: he declines multiple daily injections; he works as Futures trader; he is pursuing weight loss surgery; he did not tolerate Soliqua (abd bloating).   Interval history: pt says he has not recently missed the insulin.  He has a $0 copay.  pt states he feels well in general. no cbg record, but states cbg's vary from 97-250.  He says Stephen Hall is 160/d. Past Medical History:  Diagnosis Date  . Asthma   . Diabetes mellitus without complication ([place])   . Environmental allergies     No past surgical history on file.  Social History   Socioeconomic History  . Marital status: Married    Spouse name: Not on file  . Number of children: Not on file  . Years of education: Not on file  . Highest education level: Not on file  Occupational History  . Not on file  Tobacco Use  . Smoking status: Never Smoker  . Smokeless tobacco: Never Used  Substance and Sexual Activity  . Alcohol use: No  . Drug use: No  . Sexual activity: Yes  Other Topics Concern  . Not on file  Social History Narrative   Caffienated drinks-yes   Seat belt use often-yes   Regular Exercise-no   Smoke alarm in the home-yes   Firearms/guns in the home-no   History of physical abuse-no               Social Determinants of Health   Financial Resource Strain:   . Difficulty of Paying Living Expenses: Not on file  Food Insecurity:   . Worried About Charity fundraiser in the Last Year: Not on file  . Ran Out of Food in the Last Year: Not on file  Transportation Needs:   . Lack of Transportation (Medical): Not on file  . Lack of  Transportation (Non-Medical): Not on file  Physical Activity:   . Days of Exercise per Week: Not on file  . Minutes of Exercise per Session: Not on file  Stress:   . Feeling of Stress : Not on file  Social Connections:   . Frequency of Communication with Friends and Family: Not on file  . Frequency of Social Gatherings with Friends and Family: Not on file  . Attends Religious Services: Not on file  . Active Member of Clubs or Organizations: Not on file  . Attends Archivist Meetings: Not on file  . Marital Status: Not on file  Intimate Partner Violence:   . Fear of Current or Ex-Partner: Not on file  . Emotionally Abused: Not on file  . Physically Abused: Not on file  . Sexually Abused: Not on file    Current Outpatient Medications on File Prior to Visit  Medication Sig Dispense Refill  . Blood Glucose Monitoring Suppl (ONETOUCH VERIO FLEX SYSTEM) w/Device KIT 1 Act by Does not apply route 3 (three) times daily. 2 kit 0  . Cholecalciferol 50 MCG (2000 UT) TABS Take 1 tablet (2,000 Units total) by mouth daily. 90 tablet 3  . Insulin Pen Needle (B-D UF III MINI PEN NEEDLES)  31G X 5 MM MISC 1 each by Other route daily. 100 each 0  . Insulin Pen Needle (NOVOFINE) 32G X 6 MM MISC 2 Act by Does not apply route daily. 200 each 1  . OneTouch Delica Lancets 56L MISC USE TO CHECK BLOOD GLUCOSE 3 TIMES A DAY 100 each 1  . ONETOUCH VERIO test strip USE TO TEST BLOOD GLUCOSE 3 TIMES A DAY 200 strip 1  . rosuvastatin (CRESTOR) 10 MG tablet Take 1 tablet (10 mg total) by mouth daily. 90 tablet 1   No current facility-administered medications on file prior to visit.    No Known Allergies  Family History  Problem Relation Age of Onset  . Alcohol abuse Other   . Arthritis Other   . Hypertension Other   . Diabetes Other   . Obesity Mother   . Hypertension Mother   . Obesity Father   . Hypertension Father   . Cancer Neg Hx   . Heart disease Neg Hx   . Stroke Neg Hx   . Kidney  disease Neg Hx     BP 128/82   Pulse 95   Wt (!) 353 lb (160.1 kg)   SpO2 99%   BMI 41.86 kg/m    Review of Systems He denies hypoglycemia     Objective:   Physical Exam VITAL SIGNS:  See vs page GENERAL: no distress Pulses: dorsalis pedis intact bilat.   MSK: no deformity of the feet CV: trace bilat leg edema Skin:  no ulcer on the feet.  normal color and temp on the feet. Neuro: sensation is intact to touch on the feet   Lab Results  Component Value Date   HGBA1C 10.5 (A) 05/11/2020       Assessment & Plan:  Insulin-requiring type 2 DM: uncontrolled  Patient Instructions  Please increase the Tresiba to 180 units daily.  This is a slow time-release insulin, so if you miss it, you can make it up when you remember.   Please continue the same Xigduo.   check your blood sugar twice a day.  vary the time of day when you check, between before the 3 meals, and at bedtime.  also check if you have symptoms of your blood sugar being too high or too low.  please keep a record of the readings and bring it to your next appointment here (or you can bring the meter itself).  You can write it on any piece of paper.  please call us sooner if your blood sugar goes below 70, or if you have a lot of readings over 200.   Please come back for a follow-up appointment in 2 months.

## 2020-05-11 NOTE — Patient Instructions (Addendum)
Please increase the Tresiba to 180 units daily.  This is a slow time-release insulin, so if you miss it, you can make it up when you remember.   Please continue the same Xigduo.   check your blood sugar twice a day.  vary the time of day when you check, between before the 3 meals, and at bedtime.  also check if you have symptoms of your blood sugar being too high or too low.  please keep a record of the readings and bring it to your next appointment here (or you can bring the meter itself).  You can write it on any piece of paper.  please call us sooner if your blood sugar goes below 70, or if you have a lot of readings over 200.   Please come back for a follow-up appointment in 2 months.

## 2020-05-23 ENCOUNTER — Other Ambulatory Visit: Payer: Self-pay | Admitting: Internal Medicine

## 2020-05-23 DIAGNOSIS — E669 Obesity, unspecified: Secondary | ICD-10-CM

## 2020-05-23 DIAGNOSIS — E785 Hyperlipidemia, unspecified: Secondary | ICD-10-CM

## 2020-05-23 DIAGNOSIS — E1169 Type 2 diabetes mellitus with other specified complication: Secondary | ICD-10-CM

## 2020-05-23 MED FILL — ROSUVASTATIN CALCIUM 10 MG: 10 | 90 days supply | Qty: 90 | Fill #0

## 2020-06-30 MED FILL — TRESIBA FLEXTOUCH 200 UNITS: 200 | 30 days supply | Qty: 27 | Fill #1

## 2020-07-06 ENCOUNTER — Ambulatory Visit: Payer: BC Managed Care – PPO | Admitting: Endocrinology

## 2020-07-06 ENCOUNTER — Encounter: Payer: Self-pay | Admitting: Endocrinology

## 2020-07-06 ENCOUNTER — Other Ambulatory Visit: Payer: Self-pay | Admitting: Endocrinology

## 2020-07-06 ENCOUNTER — Other Ambulatory Visit: Payer: Self-pay

## 2020-07-06 VITALS — BP 118/64 | HR 90 | Ht 77.0 in | Wt 356.0 lb

## 2020-07-06 DIAGNOSIS — E1169 Type 2 diabetes mellitus with other specified complication: Secondary | ICD-10-CM

## 2020-07-06 DIAGNOSIS — E669 Obesity, unspecified: Secondary | ICD-10-CM

## 2020-07-06 LAB — POCT GLYCOSYLATED HEMOGLOBIN (HGB A1C): Hemoglobin A1C: 9.6 % — AB (ref 4.0–5.6)

## 2020-07-06 MED ORDER — ONETOUCH VERIO VI STRP: 1.0000 | ORAL_STRIP | Freq: Two times a day (BID) | 3 refills | Status: DC

## 2020-07-06 MED ORDER — TRULICITY 0.75 MG/0.5ML ~~LOC~~ SOAJ: 0.7500 mg | SUBCUTANEOUS | 3 refills | Status: DC

## 2020-07-06 MED ORDER — TRESIBA FLEXTOUCH 200 UNIT/ML ~~LOC~~ SOPN: 200.0000 [IU] | PEN_INJECTOR | Freq: Every day | SUBCUTANEOUS | 3 refills | Status: DC

## 2020-07-06 MED FILL — ONETOUCH VERIO TEST STRIP: 90 days supply | Qty: 200 | Fill #0

## 2020-07-06 MED FILL — TRULICITY 0.75 MG/0.5 ML PE: 0.75 | 84 days supply | Qty: 6 | Fill #0

## 2020-07-06 MED FILL — BD PEN NDL MINI 31GX5MM: 31G X 5 MM | 90 days supply | Qty: 100 | Fill #0

## 2020-07-06 NOTE — Progress Notes (Signed)
Subjective:    Patient ID: Stephen Hall, male    DOB: [birth year], 40 y.o.   MRN: [id number]  HPI Pt returns for f/u of diabetes mellitus: DM type: Insulin-requiring type 2 Dx'ed: 8979 Complications: none Therapy: insulin since 2017, and 2 oral meds.   DKA: never Severe hypoglycemia: never.   Pancreatitis: never.   SDOH: Pt says he sometimes misses DM meds, although he has a $0 copay.   Other: he declines multiple daily injections; he works as Futures trader; he is pursuing weight loss surgery; he did not tolerate Soliqua (abd bloating).   Interval history: pt says he has not recently missed the insulin.    pt states he feels well in general. no cbg record, but states cbg's vary from 90-250.   Past Medical History:  Diagnosis Date  . Asthma   . Diabetes mellitus without complication ([place])   . Environmental allergies     No past surgical history on file.  Social History   Socioeconomic History  . Marital status: Married    Spouse name: Not on file  . Number of children: Not on file  . Years of education: Not on file  . Highest education level: Not on file  Occupational History  . Not on file  Tobacco Use  . Smoking status: Never Smoker  . Smokeless tobacco: Never Used  Substance and Sexual Activity  . Alcohol use: No  . Drug use: No  . Sexual activity: Yes  Other Topics Concern  . Not on file  Social History Narrative   Caffienated drinks-yes   Seat belt use often-yes   Regular Exercise-no   Smoke alarm in the home-yes   Firearms/guns in the home-no   History of physical abuse-no               Social Determinants of Health   Financial Resource Strain: Not on file  Food Insecurity: Not on file  Transportation Needs: Not on file  Physical Activity: Not on file  Stress: Not on file  Social Connections: Not on file  Intimate Partner Violence: Not on file    Current Outpatient Medications on File Prior to Visit  Medication Sig Dispense Refill   . Blood Glucose Monitoring Suppl (ONETOUCH VERIO FLEX SYSTEM) w/Device KIT 1 Act by Does not apply route 3 (three) times daily. 2 kit 0  . Candesartan Cilexetil-HCTZ 32-25 MG TABS TAKE 1 TABLET BY MOUTH ONCE DAILY 90 tablet 0  . Cholecalciferol 50 MCG (2000 UT) TABS Take 1 tablet (2,000 Units total) by mouth daily. 90 tablet 3  . Dapagliflozin-metFORMIN HCl ER (XIGDUO XR) 10-998 MG TB24 Take 2 tablets by mouth daily. 180 tablet 3  . Insulin Pen Needle (NOVOFINE) 32G X 6 MM MISC 2 Act by Does not apply route daily. 200 each 1  . rosuvastatin (CRESTOR) 10 MG tablet TAKE 1 TABLET BY MOUTH ONCE DAILY 90 tablet 1   No current facility-administered medications on file prior to visit.    No Known Allergies  Family History  Problem Relation Age of Onset  . Alcohol abuse Other   . Arthritis Other   . Hypertension Other   . Diabetes Other   . Obesity Mother   . Hypertension Mother   . Obesity Father   . Hypertension Father   . Cancer Neg Hx   . Heart disease Neg Hx   . Stroke Neg Hx   . Kidney disease Neg Hx     BP 118/64   Pulse  90   Ht '6\' 5"'  (1.956 m)   Wt (!) 356 lb (161.5 kg)   SpO2 98%   BMI 42.22 kg/m    Review of Systems He denies hypoglycemia.      Objective:   Physical Exam VITAL SIGNS:  See vs page GENERAL: no distress Pulses: dorsalis pedis intact bilat.   MSK: no deformity of the feet CV: no leg edema Skin:  no ulcer on the feet.  normal color and temp on the feet.   Neuro: sensation is intact to touch on the feet.    A1c=9.6%     Assessment & Plan:  Insulin-requiring type 2 DM: uncontrolled.  Obesity, worse.  We discussed re-trying GLP rx.  He agrees.    Patient Instructions  Please increase the Tresiba to 200 units daily.  This is a slow time-release insulin, so if you miss it, you can make it up when you remember.   I have sent a prescription to your pharmacy, to try adding Trulicity.  If this causes stomach bloating, we should give up on it.  Please  continue the same Xigduo.   check your blood sugar twice a day.  vary the time of day when you check, between before the 3 meals, and at bedtime.  also check if you have symptoms of your blood sugar being too high or too low.  please keep a record of the readings and bring it to your next appointment here (or you can bring the meter itself).  You can write it on any piece of paper.  please call us sooner if your blood sugar goes below 70, or if you have a lot of readings over 200.   Please come back for a follow-up appointment in 2 months.

## 2020-07-06 NOTE — Patient Instructions (Addendum)
Please increase the Tresiba to 200 units daily.  This is a slow time-release insulin, so if you miss it, you can make it up when you remember.   I have sent a prescription to your pharmacy, to try adding Trulicity.  If this causes stomach bloating, we should give up on it.  Please continue the same Xigduo.   check your blood sugar twice a day.  vary the time of day when you check, between before the 3 meals, and at bedtime.  also check if you have symptoms of your blood sugar being too high or too low.  please keep a record of the readings and bring it to your next appointment here (or you can bring the meter itself).  You can write it on any piece of paper.  please call us sooner if your blood sugar goes below 70, or if you have a lot of readings over 200.   Please come back for a follow-up appointment in 2 months.

## 2020-07-07 MED FILL — ONETOUCH DELICA PLUS LANCET: 33 days supply | Qty: 100 | Fill #0

## 2020-07-09 ENCOUNTER — Other Ambulatory Visit (HOSPITAL_COMMUNITY): Payer: Self-pay | Admitting: Endocrinology

## 2020-08-16 MED FILL — TRESIBA FLEXTOUCH 200 UNITS: 200 | 30 days supply | Qty: 30 | Fill #0

## 2020-09-14 ENCOUNTER — Ambulatory Visit: Payer: BC Managed Care – PPO | Admitting: Endocrinology

## 2020-09-15 ENCOUNTER — Other Ambulatory Visit: Payer: Self-pay | Admitting: Internal Medicine

## 2020-09-15 DIAGNOSIS — E669 Obesity, unspecified: Secondary | ICD-10-CM

## 2020-09-15 DIAGNOSIS — I10 Essential (primary) hypertension: Secondary | ICD-10-CM

## 2020-09-15 DIAGNOSIS — E1169 Type 2 diabetes mellitus with other specified complication: Secondary | ICD-10-CM

## 2020-10-06 ENCOUNTER — Other Ambulatory Visit: Payer: Self-pay | Admitting: Internal Medicine

## 2020-10-06 ENCOUNTER — Other Ambulatory Visit (HOSPITAL_COMMUNITY): Payer: Self-pay

## 2020-10-06 DIAGNOSIS — E1169 Type 2 diabetes mellitus with other specified complication: Secondary | ICD-10-CM

## 2020-10-06 DIAGNOSIS — E669 Obesity, unspecified: Secondary | ICD-10-CM

## 2020-10-06 DIAGNOSIS — I10 Essential (primary) hypertension: Secondary | ICD-10-CM

## 2020-10-06 LAB — HM DIABETES EYE EXAM

## 2020-10-06 MED FILL — Insulin Degludec Soln Pen-Injector 200 Unit/ML: SUBCUTANEOUS | 90 days supply | Qty: 90 | Fill #0 | Status: AC

## 2020-10-06 MED FILL — Dapagliflozin Prop-Metformin HCl Tab ER 24HR 5-1000 MG: ORAL | 90 days supply | Qty: 180 | Fill #0 | Status: AC

## 2020-10-06 MED FILL — Dulaglutide Soln Auto-injector 0.75 MG/0.5ML: SUBCUTANEOUS | 84 days supply | Qty: 6 | Fill #0 | Status: AC

## 2020-10-07 ENCOUNTER — Other Ambulatory Visit (HOSPITAL_COMMUNITY): Payer: Self-pay

## 2020-10-10 ENCOUNTER — Other Ambulatory Visit (HOSPITAL_COMMUNITY): Payer: Self-pay

## 2020-10-11 ENCOUNTER — Other Ambulatory Visit (HOSPITAL_COMMUNITY): Payer: Self-pay

## 2020-10-18 ENCOUNTER — Other Ambulatory Visit: Payer: Self-pay | Admitting: Internal Medicine

## 2020-10-18 ENCOUNTER — Ambulatory Visit: Payer: BC Managed Care – PPO | Admitting: Internal Medicine

## 2020-10-18 ENCOUNTER — Other Ambulatory Visit (HOSPITAL_COMMUNITY): Payer: Self-pay

## 2020-10-18 ENCOUNTER — Encounter: Payer: Self-pay | Admitting: Internal Medicine

## 2020-10-18 ENCOUNTER — Other Ambulatory Visit: Payer: Self-pay

## 2020-10-18 VITALS — BP 142/106 | HR 86 | Temp 98.5°F | Resp 16 | Ht 77.0 in | Wt 357.8 lb

## 2020-10-18 DIAGNOSIS — E559 Vitamin D deficiency, unspecified: Secondary | ICD-10-CM | POA: Diagnosis not present

## 2020-10-18 DIAGNOSIS — E669 Obesity, unspecified: Secondary | ICD-10-CM

## 2020-10-18 DIAGNOSIS — R809 Proteinuria, unspecified: Secondary | ICD-10-CM

## 2020-10-18 DIAGNOSIS — Z0000 Encounter for general adult medical examination without abnormal findings: Secondary | ICD-10-CM | POA: Diagnosis not present

## 2020-10-18 DIAGNOSIS — E1129 Type 2 diabetes mellitus with other diabetic kidney complication: Secondary | ICD-10-CM

## 2020-10-18 DIAGNOSIS — M545 Low back pain, unspecified: Secondary | ICD-10-CM

## 2020-10-18 DIAGNOSIS — I10 Essential (primary) hypertension: Secondary | ICD-10-CM | POA: Diagnosis not present

## 2020-10-18 DIAGNOSIS — E1169 Type 2 diabetes mellitus with other specified complication: Secondary | ICD-10-CM

## 2020-10-18 DIAGNOSIS — E785 Hyperlipidemia, unspecified: Secondary | ICD-10-CM | POA: Diagnosis not present

## 2020-10-18 DIAGNOSIS — G4733 Obstructive sleep apnea (adult) (pediatric): Secondary | ICD-10-CM

## 2020-10-18 LAB — TSH: TSH: 2.83 u[IU]/mL (ref 0.35–4.50)

## 2020-10-18 LAB — LIPID PANEL
Cholesterol: 143 mg/dL (ref 0–200)
HDL: 34.4 mg/dL — ABNORMAL LOW (ref >39.00)
LDL Cholesterol: 90 mg/dL (ref 0–99)
NonHDL: 108.66
Total CHOL/HDL Ratio: 4
Triglycerides: 93 mg/dL (ref 0.0–149.0)
VLDL: 18.6 mg/dL (ref 0.0–40.0)

## 2020-10-18 LAB — URINALYSIS, ROUTINE W REFLEX MICROSCOPIC
Bilirubin Urine: NEGATIVE
Hgb urine dipstick: NEGATIVE
Ketones, ur: NEGATIVE
Leukocytes,Ua: NEGATIVE
Nitrite: NEGATIVE
RBC / HPF: NONE SEEN (ref 0–?)
Specific Gravity, Urine: 1.01 (ref 1.000–1.030)
Total Protein, Urine: NEGATIVE
Urine Glucose: >=1000 — AB
Urobilinogen, UA: 0.2 (ref 0.0–1.0)
WBC, UA: NONE SEEN (ref 0–?)
pH: 6 (ref 5.0–8.0)

## 2020-10-18 LAB — CBC WITH DIFFERENTIAL/PLATELET
Basophils Absolute: 0.1 10*3/uL (ref 0.0–0.1)
Basophils Relative: 0.6 % (ref 0.0–3.0)
Eosinophils Absolute: 0.2 10*3/uL (ref 0.0–0.7)
Eosinophils Relative: 2.9 % (ref 0.0–5.0)
HCT: 45.7 % (ref 39.0–52.0)
Hemoglobin: 14.9 g/dL (ref 13.0–17.0)
Lymphocytes Relative: 30.8 % (ref 12.0–46.0)
Lymphs Abs: 2.6 10*3/uL (ref 0.7–4.0)
MCHC: 32.7 g/dL (ref 30.0–36.0)
MCV: 75.9 fl — ABNORMAL LOW (ref 78.0–100.0)
Monocytes Absolute: 0.7 10*3/uL (ref 0.1–1.0)
Monocytes Relative: 8 % (ref 3.0–12.0)
Neutro Abs: 4.9 10*3/uL (ref 1.4–7.7)
Neutrophils Relative %: 57.7 % (ref 43.0–77.0)
Platelets: 282 10*3/uL (ref 150.0–400.0)
RBC: 6.02 Mil/uL — ABNORMAL HIGH (ref 4.22–5.81)
RDW: 15.4 % (ref 11.5–15.5)
WBC: 8.5 10*3/uL (ref 4.0–10.5)

## 2020-10-18 LAB — BASIC METABOLIC PANEL
BUN: 9 mg/dL (ref 6–23)
CO2: 28 mEq/L (ref 19–32)
Calcium: 9.9 mg/dL (ref 8.4–10.5)
Chloride: 100 mEq/L (ref 96–112)
Creatinine, Ser: 0.89 mg/dL (ref 0.40–1.50)
GFR: 108.71 mL/min (ref >60.00)
Glucose, Bld: 120 mg/dL — ABNORMAL HIGH (ref 70–99)
Potassium: 3.8 mEq/L (ref 3.5–5.1)
Sodium: 138 mEq/L (ref 135–145)

## 2020-10-18 LAB — HEPATIC FUNCTION PANEL
ALT: 23 U/L (ref 0–53)
AST: 15 U/L (ref 0–37)
Albumin: 4.1 g/dL (ref 3.5–5.2)
Alkaline Phosphatase: 88 U/L (ref 39–117)
Bilirubin, Direct: 0.1 mg/dL (ref 0.0–0.3)
Total Bilirubin: 0.4 mg/dL (ref 0.2–1.2)
Total Protein: 7.7 g/dL (ref 6.0–8.3)

## 2020-10-18 LAB — MICROALBUMIN / CREATININE URINE RATIO
Creatinine,U: 48.6 mg/dL
Microalb Creat Ratio: 1.4 mg/g (ref 0.0–30.0)
Microalb, Ur: <0.7 mg/dL (ref 0.0–1.9)

## 2020-10-18 LAB — VITAMIN D 25 HYDROXY (VIT D DEFICIENCY, FRACTURES): VITD: 24.92 ng/mL — ABNORMAL LOW (ref 30.00–100.00)

## 2020-10-18 LAB — HEMOGLOBIN A1C: Hgb A1c MFr Bld: 10 % — ABNORMAL HIGH (ref 4.6–6.5)

## 2020-10-18 MED ORDER — CANDESARTAN CILEXETIL-HCTZ 32-25 MG PO TABS
1.0000 | ORAL_TABLET | Freq: Every day | ORAL | 0 refills | Status: DC
Start: 2020-10-18 — End: 2021-03-10
  Filled 2020-10-18: qty 90, 90d supply, fill #0

## 2020-10-18 MED ORDER — FREESTYLE LIBRE 2 READER DEVI
1.0000 | Freq: Every day | 5 refills | Status: DC
  Filled 2020-10-18: qty 2, 28d supply, fill #0

## 2020-10-18 MED ORDER — HUMALOG KWIKPEN 200 UNIT/ML ~~LOC~~ SOPN
10.0000 [IU] | PEN_INJECTOR | Freq: Three times a day (TID) | SUBCUTANEOUS | 1 refills | Status: DC
  Filled 2020-10-18: qty 9, 30d supply, fill #0

## 2020-10-18 MED ORDER — INSULIN PEN NEEDLE 32G X 6 MM MISC
1.0000 | Freq: Four times a day (QID) | 1 refills | Status: DC
  Filled 2020-10-18: qty 300, 75d supply, fill #0
  Filled 2020-12-23 – 2020-12-26 (×2): qty 300, 90d supply, fill #0

## 2020-10-18 MED ORDER — DEXCOM G6 TRANSMITTER MISC: 1.0000 | Freq: Every day | 5 refills | Status: DC

## 2020-10-18 MED ORDER — DEXCOM G6 SENSOR MISC: 1.0000 | Freq: Every day | 5 refills | Status: DC

## 2020-10-18 MED ORDER — TRULICITY 1.5 MG/0.5ML ~~LOC~~ SOAJ
1.5000 mg | SUBCUTANEOUS | 0 refills | Status: DC
  Filled 2020-10-18 – 2020-12-23 (×2): qty 2, 28d supply, fill #0

## 2020-10-18 MED ORDER — DEXCOM G6 RECEIVER DEVI: 1.0000 | Freq: Every day | 5 refills | Status: DC

## 2020-10-18 MED ORDER — FREESTYLE LIBRE 2 SENSOR MISC
1.0000 | Freq: Every day | 5 refills | Status: DC
  Filled 2020-10-18: qty 2, 28d supply, fill #0

## 2020-10-18 NOTE — Patient Instructions (Signed)

## 2020-10-18 NOTE — Progress Notes (Signed)
Subjective:  Patient ID: Stephen Hall, male    DOB: 01-Sep-1980  Age: 40 y.o. MRN: [id number]  CC: Annual Exam, Hypertension, Hyperlipidemia, and Diabetes  This visit occurred during the SARS-CoV-2 public health emergency.  Safety protocols were in place, including screening questions prior to the visit, additional usage of staff PPE, and extensive cleaning of exam room while observing appropriate contact time as indicated for disinfecting solutions.    HPI Stephen Hall presents for a CPX.  He recently climbed [facility] and did not experience CP, DOE, palpitations, or edema.  According to prescription refills he is not currently taking an antihypertensive.  He has not been monitoring his blood pressure or his blood sugar.  Outpatient Medications Prior to Visit  Medication Sig Dispense Refill  . Blood Glucose Monitoring Suppl (ONETOUCH VERIO FLEX SYSTEM) w/Device KIT 1 Act by Does not apply route 3 (three) times daily. 2 kit 0  . Dapagliflozin-metFORMIN HCl ER 10-998 MG TB24 TAKE 2 TABLETS BY MOUTH DAILY. 180 tablet 3  . glucose blood test strip USE AS DIRECTED 2 TIMES DAILY IN THE MORNING AND AT BEDTIME (Patient taking differently: USE AS DIRECTED 2 TIMES DAILY IN THE MORNING AND AT BEDTIME) 200 strip 3  . insulin degludec (TRESIBA) 200 UNIT/ML FlexTouch Pen INJECT 200 UNITS INTO THE SKIN DAILY. 90 mL 3  . Lancets (ONETOUCH DELICA PLUS [id number]) MISC USE TO CHECK BLOOD GLUCOSE THREE TIMES DAILY 100 each 1  . OneTouch Delica Lancets 11H MISC USE AS DIRECTED 2 TIMES DAILY 100 each 1  . rosuvastatin (CRESTOR) 10 MG tablet TAKE 1 TABLET BY MOUTH ONCE DAILY 90 tablet 1  . Candesartan Cilexetil-HCTZ 32-25 MG TABS TAKE 1 TABLET BY MOUTH ONCE DAILY 90 tablet 0  . Cholecalciferol 50 MCG (2000 UT) TABS Take 1 tablet (2,000 Units total) by mouth daily. 90 tablet 3  . Dulaglutide 0.75 MG/0.5ML SOPN INJECT 0.75 MG INTO THE SKIN ONCE A WEEK. 6 mL 3  . Insulin Pen Needle (NOVOFINE) 32G X 6 MM  MISC 2 Act by Does not apply route daily. 200 each 1  . Insulin Pen Needle 31G X 5 MM MISC USE DAILY AS DIRECTED 100 each 0   No facility-administered medications prior to visit.    ROS Review of Systems  Constitutional: Positive for fatigue. Negative for appetite change, chills, diaphoresis and fever.  HENT: Negative.   Eyes: Negative for visual disturbance.  Respiratory: Positive for apnea. Negative for cough, chest tightness, shortness of breath and wheezing.   Cardiovascular: Negative for chest pain, palpitations and leg swelling.  Gastrointestinal: Negative for abdominal pain, constipation, diarrhea, nausea and vomiting.  Endocrine: Negative.  Negative for polydipsia, polyphagia and polyuria.  Genitourinary: Negative.  Negative for dysuria, flank pain, hematuria and urgency.  Musculoskeletal: Positive for back pain. Negative for arthralgias, myalgias and neck pain.       NR LBP  Skin: Negative.   Neurological: Negative.  Negative for dizziness, weakness and numbness.  Hematological: Negative for adenopathy. Does not bruise/bleed easily.  Psychiatric/Behavioral: Positive for sleep disturbance. Negative for dysphoric mood. The patient is not nervous/anxious.     Objective:  BP (!) 142/106 (BP Location: Left Arm, Patient Position: Sitting, Cuff Size: Large)   Pulse 86   Temp 98.5 F (36.9 C) (Oral)   Resp 16   Ht '6\' 5"'  (1.956 m)   Wt (!) 357 lb 12.8 oz (162.3 kg)   SpO2 97%   BMI 42.43 kg/m   BP Readings from Last  3 Encounters:  10/18/20 (!) 142/106  07/06/20 118/64  05/11/20 128/82    Wt Readings from Last 3 Encounters:  10/18/20 (!) 357 lb 12.8 oz (162.3 kg)  07/06/20 (!) 356 lb (161.5 kg)  05/11/20 (!) 353 lb (160.1 kg)    Physical Exam Vitals reviewed.  Constitutional:      Appearance: Normal appearance. He is obese.  HENT:     Nose: Nose normal.     Mouth/Throat:     Mouth: Mucous membranes are moist.  Eyes:     General: No scleral icterus.     Conjunctiva/sclera: Conjunctivae normal.  Cardiovascular:     Rate and Rhythm: Normal rate and regular rhythm.     Heart sounds: No murmur heard.   Pulmonary:     Effort: Pulmonary effort is normal.     Breath sounds: No stridor. No wheezing, rhonchi or rales.  Abdominal:     General: Abdomen is flat.     Palpations: There is no mass.     Tenderness: There is no abdominal tenderness. There is no guarding.     Hernia: No hernia is present.  Musculoskeletal:        General: Normal range of motion.     Cervical back: Normal and neck supple.     Thoracic back: Normal.     Lumbar back: Normal. No bony tenderness. Negative right straight leg raise test and negative left straight leg raise test.     Right lower leg: No edema.     Left lower leg: No edema.  Lymphadenopathy:     Cervical: No cervical adenopathy.  Skin:    General: Skin is warm and dry.     Coloration: Skin is not pale.  Neurological:     General: No focal deficit present.     Mental Status: He is alert.  Psychiatric:        Mood and Affect: Mood normal.        Behavior: Behavior normal.     Lab Results  Component Value Date   WBC 8.5 10/18/2020   HGB 14.9 10/18/2020   HCT 45.7 10/18/2020   PLT 282.0 10/18/2020   GLUCOSE 120 (H) 10/18/2020   CHOL 143 10/18/2020   TRIG 93.0 10/18/2020   HDL 34.40 (L) 10/18/2020   LDLDIRECT 158.1 02/07/2012   LDLCALC 90 10/18/2020   ALT 23 10/18/2020   AST 15 10/18/2020   NA 138 10/18/2020   K 3.8 10/18/2020   CL 100 10/18/2020   CREATININE 0.89 10/18/2020   BUN 9 10/18/2020   CO2 28 10/18/2020   TSH 2.83 10/18/2020   HGBA1C 10.0 (H) 10/18/2020   MICROALBUR <0.7 10/18/2020    No results found.  Assessment & Plan:   Stephen Hall was seen today for annual exam, hypertension, hyperlipidemia and diabetes.  Diagnoses and all orders for this visit:  Essential hypertension- His blood pressure is not well controlled.  Will restart the thiazide diuretic and ARB. -     CBC  with Differential/Platelet; Future -     Basic metabolic panel; Future -     TSH; Future -     Urinalysis, Routine w reflex microscopic; Future -     Candesartan Cilexetil-HCTZ 32-25 MG TABS; TAKE 1 TABLET BY MOUTH ONCE DAILY -     Urinalysis, Routine w reflex microscopic -     TSH -     Basic metabolic panel -     CBC with Differential/Platelet  Diabetes mellitus type 2 in obese (  [place])- His A1c is at 10.0%.  Will increase the dose of the GLP-1 agonist and will add bolus insulin to the basal insulin. -     Basic metabolic panel; Future -     Hemoglobin A1c; Future -     Microalbumin / creatinine urine ratio; Future -     HM Diabetes Foot Exam -     Continuous Blood Gluc Sensor (FREESTYLE LIBRE 2 SENSOR) MISC; Use daily as directed. -     Discontinue: Continuous Blood Gluc Receiver (FREESTYLE LIBRE 2 READER) DEVI; Use daily as directed. -     Candesartan Cilexetil-HCTZ 32-25 MG TABS; TAKE 1 TABLET BY MOUTH ONCE DAILY -     Dulaglutide (TRULICITY) 1.5 QP/6.1PJ SOPN; Inject 1.5 mg into the skin once a week. -     Microalbumin / creatinine urine ratio -     Hemoglobin A1c -     Basic metabolic panel -     Continuous Blood Gluc Receiver (DEXCOM G6 RECEIVER) DEVI; 1 Act by Does not apply route daily. -     Continuous Blood Gluc Sensor (DEXCOM G6 SENSOR) MISC; 1 Act by Does not apply route daily. -     Continuous Blood Gluc Transmit (DEXCOM G6 TRANSMITTER) MISC; 1 Act by Does not apply route daily.  Hyperlipidemia with target LDL less than 130- He has achieved his LDL goal and is doing well on the statin. -     Hepatic function panel; Future -     TSH; Future -     TSH -     Hepatic function panel  Routine general medical examination at a health care facility- Exam completed, labs reviewed, vaccines reviewed and updated, patient education was given. -     Lipid panel; Future -     Lipid panel  Vitamin D deficiency disease -     VITAMIN D 25 Hydroxy (Vit-D Deficiency, Fractures);  Future -     VITAMIN D 25 Hydroxy (Vit-D Deficiency, Fractures) -     Cholecalciferol 50 MCG (2000 UT) TABS; Take 1 tablet (2,000 Units total) by mouth daily.  Morbid obesity ([place])- Will increase the dose of the GLP-1 agonist. -     TSH; Future -     TSH  OSA (obstructive sleep apnea) -     Ambulatory referral to Sleep Studies  Type 2 diabetes mellitus with microalbuminuria, without long-term current use of insulin ([facility]) -     Continuous Blood Gluc Receiver (DEXCOM G6 RECEIVER) DEVI; 1 Act by Does not apply route daily. -     Continuous Blood Gluc Sensor (DEXCOM G6 SENSOR) MISC; 1 Act by Does not apply route daily. -     Continuous Blood Gluc Transmit (DEXCOM G6 TRANSMITTER) MISC; 1 Act by Does not apply route daily. -     insulin lispro (HUMALOG KWIKPEN) 200 UNIT/ML KwikPen; Inject 10 Units into the skin 3 (three) times daily before meals. -     Insulin Pen Needle 32G X 6 MM MISC; Use as directed morning, noon, in the evening and at bedtime. -     Amb Referral to Nutrition and Diabetic Education  Acute bilateral low back pain without sciatica -     tiZANidine (ZANAFLEX) 2 MG tablet; Take 1 tablet (2 mg total) by mouth every 8 (eight) hours as needed for muscle spasms.   I have discontinued Stephen Hall's NovoFine, Insulin Pen Needle, Dulaglutide, and FreeStyle Libre 2 Reader. I am also having him start on FreeStyle [place] 2  Sensor, Trulicity, Dexcom G6 Receiver, Dexcom G6 Sensor, Dexcom G6 Transmitter, HumaLOG KwikPen, Insulin Pen Needle, and tiZANidine. Additionally, I am having him maintain his OneTouch [employer], OneTouch Delica Plus [id number], OneTouch Delica Lancets 57D, glucose blood, insulin degludec, rosuvastatin, Dapagliflozin-metFORMIN HCl ER, Candesartan Cilexetil-HCTZ, and Cholecalciferol.  Meds ordered this encounter  Medications  . Continuous Blood Gluc Sensor (FREESTYLE LIBRE 2 SENSOR) MISC    Sig: Use daily as directed.    Dispense:  2 each    Refill:  5  .  DISCONTD: Continuous Blood Gluc Receiver (FREESTYLE LIBRE 2 READER) DEVI    Sig: Use daily as directed.    Dispense:  2 each    Refill:  5  . Candesartan Cilexetil-HCTZ 32-25 MG TABS    Sig: TAKE 1 TABLET BY MOUTH ONCE DAILY    Dispense:  90 tablet    Refill:  0  . Dulaglutide (TRULICITY) 1.5 UK/0.2RK SOPN    Sig: Inject 1.5 mg into the skin once a week.    Dispense:  2 mL    Refill:  0  . Continuous Blood Gluc Receiver (DEXCOM G6 RECEIVER) DEVI    Sig: 1 Act by Does not apply route daily.    Dispense:  1 each    Refill:  5  . Continuous Blood Gluc Sensor (DEXCOM G6 SENSOR) MISC    Sig: 1 Act by Does not apply route daily.    Dispense:  1 each    Refill:  5  . Continuous Blood Gluc Transmit (DEXCOM G6 TRANSMITTER) MISC    Sig: 1 Act by Does not apply route daily.    Dispense:  1 each    Refill:  5  . insulin lispro (HUMALOG KWIKPEN) 200 UNIT/ML KwikPen    Sig: Inject 10 Units into the skin 3 (three) times daily before meals.    Dispense:  9 mL    Refill:  1  . Insulin Pen Needle 32G X 6 MM MISC    Sig: Use as directed morning, noon, in the evening and at bedtime.    Dispense:  300 each    Refill:  1  . Cholecalciferol 50 MCG (2000 UT) TABS    Sig: Take 1 tablet (2,000 Units total) by mouth daily.    Dispense:  90 tablet    Refill:  3  . tiZANidine (ZANAFLEX) 2 MG tablet    Sig: Take 1 tablet (2 mg total) by mouth every 8 (eight) hours as needed for muscle spasms.    Dispense:  90 tablet    Refill:  1     Follow-up: Return in about 3 months (around 01/17/2021).  Scarlette Calico, MD

## 2020-10-19 ENCOUNTER — Other Ambulatory Visit (HOSPITAL_COMMUNITY): Payer: Self-pay

## 2020-10-19 ENCOUNTER — Encounter: Payer: Self-pay | Admitting: Internal Medicine

## 2020-10-19 MED ORDER — CHOLECALCIFEROL 50 MCG (2000 UT) PO TABS
1.0000 | ORAL_TABLET | Freq: Every day | ORAL | 3 refills | Status: DC
  Filled 2020-10-19: qty 90, 90d supply, fill #0

## 2020-10-20 ENCOUNTER — Other Ambulatory Visit (HOSPITAL_COMMUNITY): Payer: Self-pay

## 2020-10-20 DIAGNOSIS — M545 Low back pain, unspecified: Secondary | ICD-10-CM | POA: Insufficient documentation

## 2020-10-20 MED ORDER — TIZANIDINE HCL 2 MG PO TABS
2.0000 mg | ORAL_TABLET | Freq: Three times a day (TID) | ORAL | 1 refills | Status: DC | PRN
  Filled 2020-10-20: qty 90, 30d supply, fill #0

## 2020-10-21 ENCOUNTER — Other Ambulatory Visit (HOSPITAL_COMMUNITY): Payer: Self-pay

## 2020-10-26 ENCOUNTER — Ambulatory Visit: Payer: BC Managed Care – PPO | Admitting: Endocrinology

## 2020-10-26 ENCOUNTER — Other Ambulatory Visit: Payer: Self-pay

## 2020-10-26 VITALS — BP 116/80 | HR 81 | Ht 77.0 in | Wt 358.6 lb

## 2020-10-26 DIAGNOSIS — I10 Essential (primary) hypertension: Secondary | ICD-10-CM | POA: Diagnosis not present

## 2020-10-26 DIAGNOSIS — R809 Proteinuria, unspecified: Secondary | ICD-10-CM

## 2020-10-26 DIAGNOSIS — E1129 Type 2 diabetes mellitus with other diabetic kidney complication: Secondary | ICD-10-CM | POA: Diagnosis not present

## 2020-10-26 LAB — POCT GLYCOSYLATED HEMOGLOBIN (HGB A1C): Hemoglobin A1C: 9.5 % — AB (ref 4.0–5.6)

## 2020-10-26 NOTE — Patient Instructions (Addendum)
Please increase the Trulicity to 1.5 mg weekly.  This is a slow time-release insulin, so if you miss it, you can make it up when you remember.   Please continue the same [place].   A different type of diabetes blood test is requested for you today.  We'll let you know about the results.  check your blood sugar twice a day.  vary the time of day when you check, between before the 3 meals, and at bedtime.  also check if you have symptoms of your blood sugar being too high or too low.  please keep a record of the readings and bring it to your next appointment here (or you can bring the meter itself).  You can write it on any piece of paper.  please call us sooner if your blood sugar goes below 70, or if you have a lot of readings over 200.   Please come back for a follow-up appointment in 2 months.

## 2020-10-26 NOTE — Progress Notes (Signed)
Subjective:    Patient ID: Stephen Hall, male    DOB: Dec 09, 1980, 40 y.o.   MRN: [id number]  HPI Pt returns for f/u of diabetes mellitus: DM type: Insulin-requiring type 2 Dx'ed: 4097 Complications: none Therapy: insulin since 2017, and 2 oral meds.   DKA: never Severe hypoglycemia: never.   Pancreatitis: never.   SDOH: Pt says he sometimes misses DM meds, although he has a $0 copay.   Other: he declines multiple daily injections; he works as Futures trader; he is pursuing weight loss surgery; he did not tolerate Soliqua (abd bloating).   Interval history: pt says he has not recently missed the insulin.  pt states he feels well in general. no cbg record, but states cbg's vary from 94-230.  He says Trulicity is just 3.53 mg weekly.   Past Medical History:  Diagnosis Date  . Asthma   . Diabetes mellitus without complication ([place])   . Environmental allergies     No past surgical history on file.  Social History   Socioeconomic History  . Marital status: Married    Spouse name: Not on file  . Number of children: Not on file  . Years of education: Not on file  . Highest education level: Not on file  Occupational History  . Not on file  Tobacco Use  . Smoking status: Never Smoker  . Smokeless tobacco: Never Used  Substance and Sexual Activity  . Alcohol use: No  . Drug use: No  . Sexual activity: Yes  Other Topics Concern  . Not on file  Social History Narrative   Caffienated drinks-yes   Seat belt use often-yes   Regular Exercise-no   Smoke alarm in the home-yes   Firearms/guns in the home-no   History of physical abuse-no               Social Determinants of Health   Financial Resource Strain: Not on file  Food Insecurity: Not on file  Transportation Needs: Not on file  Physical Activity: Not on file  Stress: Not on file  Social Connections: Not on file  Intimate Partner Violence: Not on file    Current Outpatient Medications on File Prior  to Visit  Medication Sig Dispense Refill  . Blood Glucose Monitoring Suppl (ONETOUCH VERIO FLEX SYSTEM) w/Device KIT 1 Act by Does not apply route 3 (three) times daily. 2 kit 0  . Candesartan Cilexetil-HCTZ 32-25 MG TABS TAKE 1 TABLET BY MOUTH ONCE DAILY 90 tablet 0  . Cholecalciferol 50 MCG (2000 UT) TABS Take 1 tablet (2,000 Units total) by mouth daily. 90 tablet 3  . Continuous Blood Gluc Receiver (DEXCOM G6 RECEIVER) DEVI 1 Act by Does not apply route daily. 1 each 5  . Continuous Blood Gluc Sensor (DEXCOM G6 SENSOR) MISC 1 Act by Does not apply route daily. 1 each 5  . Continuous Blood Gluc Transmit (DEXCOM G6 TRANSMITTER) MISC 1 Act by Does not apply route daily. 1 each 5  . Dapagliflozin-metFORMIN HCl ER 10-998 MG TB24 TAKE 2 TABLETS BY MOUTH DAILY. 180 tablet 3  . Dulaglutide (TRULICITY) 1.5 GD/9.2EQ SOPN Inject 1.5 mg into the skin once a week. 2 mL 0  . glucose blood test strip USE AS DIRECTED 2 TIMES DAILY IN THE MORNING AND AT BEDTIME (Patient taking differently: USE AS DIRECTED 2 TIMES DAILY IN THE MORNING AND AT BEDTIME) 200 strip 3  . insulin degludec (TRESIBA) 200 UNIT/ML FlexTouch Pen INJECT 200 UNITS INTO THE SKIN DAILY. [place]  mL 3  . insulin lispro (HUMALOG KWIKPEN) 200 UNIT/ML KwikPen Inject 10 Units into the skin 3 (three) times daily before meals. 9 mL 1  . Insulin Pen Needle 32G X 6 MM MISC Use as directed morning, noon, in the evening and at bedtime. 300 each 1  . Lancets (ONETOUCH DELICA PLUS CXFQHK25J) MISC USE TO CHECK BLOOD GLUCOSE THREE TIMES DAILY 100 each 1  . OneTouch Delica Lancets 50N MISC USE AS DIRECTED 2 TIMES DAILY 100 each 1  . rosuvastatin (CRESTOR) 10 MG tablet TAKE 1 TABLET BY MOUTH ONCE DAILY 90 tablet 1  . tiZANidine (ZANAFLEX) 2 MG tablet Take 1 tablet (2 mg total) by mouth every 8 (eight) hours as needed for muscle spasms. 90 tablet 1   No current facility-administered medications on file prior to visit.    No Known Allergies  Family History   Problem Relation Age of Onset  . Alcohol abuse Other   . Arthritis Other   . Hypertension Other   . Diabetes Other   . Obesity Mother   . Hypertension Mother   . Obesity Father   . Hypertension Father   . Cancer Neg Hx   . Heart disease Neg Hx   . Stroke Neg Hx   . Kidney disease Neg Hx     BP 116/80 (BP Location: Right Arm, Patient Position: Sitting, Cuff Size: Large)   Pulse 81   Ht '6\' 5"'  (1.956 m)   Wt (!) 358 lb 9.6 oz (162.7 kg)   SpO2 99%   BMI 42.52 kg/m    Review of Systems Denies n/v.      Objective:   Physical Exam VITAL SIGNS:  See vs page GENERAL: no distress Pulses: dorsalis pedis intact bilat.   MSK: no deformity of the feet CV: trace bilat leg edema Skin:  no ulcer on the feet.  normal color and temp on the feet. Neuro: sensation is intact to touch on the feet   Lab Results  Component Value Date   HGBA1C 9.5 (A) 10/26/2020       Assessment & Plan:  Insulin-requiring type 2 DM: uncontrolled.   Discordance between cbg's and A1c: check fructosamine.    Patient Instructions  Please increase the Trulicity to 1.5 mg weekly.  This is a slow time-release insulin, so if you miss it, you can make it up when you remember.   Please continue the same [place].   A different type of diabetes blood test is requested for you today.  We'll let you know about the results.  check your blood sugar twice a day.  vary the time of day when you check, between before the 3 meals, and at bedtime.  also check if you have symptoms of your blood sugar being too high or too low.  please keep a record of the readings and bring it to your next appointment here (or you can bring the meter itself).  You can write it on any piece of paper.  please call us sooner if your blood sugar goes below 70, or if you have a lot of readings over 200.   Please come back for a follow-up appointment in 2 months.

## 2020-10-28 LAB — FRUCTOSAMINE: Fructosamine: 275 umol/L (ref 205–285)

## 2020-11-02 ENCOUNTER — Telehealth: Payer: Self-pay | Admitting: Internal Medicine

## 2020-11-02 NOTE — Telephone Encounter (Signed)
[facility] called and was checking the status of the PA for the Dexcom. Please advise   Phone: [phone]  Sensor Key: Public affairs consultant Key: [id number] Receiver Key: [id number]

## 2020-11-03 NOTE — Telephone Encounter (Addendum)
Sensor Key: [id number]  Your PA request has been Holiday representative Key: [id number]  Your PA has been resolved, no additional PA is required.  Receiver Key: [id number]  Your PA has been resolved, no additional PA is required.

## 2020-11-07 ENCOUNTER — Other Ambulatory Visit (HOSPITAL_COMMUNITY): Payer: Self-pay

## 2020-11-07 MED ORDER — DEXCOM G6 SENSOR MISC
5 refills | Status: DC
  Filled 2020-11-07 – 2020-11-18 (×2): qty 3, 30d supply, fill #0
  Filled 2020-12-23: qty 3, 30d supply, fill #1

## 2020-11-07 MED ORDER — DEXCOM G6 RECEIVER DEVI
5 refills | Status: DC
  Filled 2020-11-07 – 2020-11-18 (×2): qty 1, 90d supply, fill #0

## 2020-11-07 MED ORDER — DEXCOM G6 TRANSMITTER MISC
5 refills | Status: DC
  Filled 2020-11-07 – 2020-11-18 (×2): qty 1, 90d supply, fill #0
  Filled 2021-03-14: qty 1, 90d supply, fill #1

## 2020-11-08 ENCOUNTER — Other Ambulatory Visit (HOSPITAL_COMMUNITY): Payer: Self-pay

## 2020-11-09 ENCOUNTER — Other Ambulatory Visit (HOSPITAL_COMMUNITY): Payer: Self-pay

## 2020-11-10 ENCOUNTER — Other Ambulatory Visit (HOSPITAL_COMMUNITY): Payer: Self-pay

## 2020-11-14 ENCOUNTER — Other Ambulatory Visit (HOSPITAL_COMMUNITY): Payer: Self-pay

## 2020-11-17 ENCOUNTER — Other Ambulatory Visit (HOSPITAL_COMMUNITY): Payer: Self-pay

## 2020-11-18 ENCOUNTER — Other Ambulatory Visit (HOSPITAL_COMMUNITY): Payer: Self-pay

## 2020-12-23 ENCOUNTER — Other Ambulatory Visit (HOSPITAL_COMMUNITY): Payer: Self-pay

## 2020-12-26 ENCOUNTER — Other Ambulatory Visit (HOSPITAL_COMMUNITY): Payer: Self-pay

## 2020-12-27 ENCOUNTER — Other Ambulatory Visit (HOSPITAL_COMMUNITY): Payer: Self-pay

## 2020-12-28 ENCOUNTER — Other Ambulatory Visit (HOSPITAL_COMMUNITY): Payer: Self-pay

## 2020-12-29 ENCOUNTER — Other Ambulatory Visit: Payer: Self-pay

## 2020-12-29 ENCOUNTER — Ambulatory Visit: Payer: BC Managed Care – PPO | Admitting: Neurology

## 2020-12-29 ENCOUNTER — Other Ambulatory Visit (HOSPITAL_COMMUNITY): Payer: Self-pay

## 2020-12-29 ENCOUNTER — Encounter: Payer: Self-pay | Admitting: Neurology

## 2020-12-29 VITALS — BP 132/76 | HR 100 | Ht 77.0 in | Wt 363.0 lb

## 2020-12-29 DIAGNOSIS — F518 Other sleep disorders not due to a substance or known physiological condition: Secondary | ICD-10-CM | POA: Diagnosis not present

## 2020-12-29 DIAGNOSIS — G4719 Other hypersomnia: Secondary | ICD-10-CM

## 2020-12-29 DIAGNOSIS — Z6841 Body Mass Index (BMI) 40.0 and over, adult: Secondary | ICD-10-CM

## 2020-12-29 DIAGNOSIS — E669 Obesity, unspecified: Secondary | ICD-10-CM

## 2020-12-29 DIAGNOSIS — E1169 Type 2 diabetes mellitus with other specified complication: Secondary | ICD-10-CM

## 2020-12-29 DIAGNOSIS — G4753 Recurrent isolated sleep paralysis: Secondary | ICD-10-CM

## 2020-12-29 MED ORDER — ARMODAFINIL 250 MG PO TABS
250.0000 mg | ORAL_TABLET | Freq: Every day | ORAL | 1 refills | Status: DC
  Filled 2020-12-29: qty 30, 30d supply, fill #0

## 2020-12-29 NOTE — Progress Notes (Signed)
SLEEP MEDICINE CLINIC    Provider:  Larey Seat, MD  Primary Care Physician:  Janith Lima, MD [place] [address]     Referring Provider: Janith Lima, [address],  [place] [address]          Chief Complaint according to patient   Patient presents with:     New Patient (Initial Visit)     Pt last SS 12/2016. Here because he is still having problems with broken sleep and having to take naps throughout the day. He states he will go to sleep 11 and wake up around 2 then have a hard time falling back asleep. Can go back tosleep 4-6 am and that seems to be his best sleep. He takes up to 3 naps a day.       HISTORY OF PRESENT ILLNESS:  Clive Parcel is a 40 y.o. year old 30 or African American male patient seen here as a referral on 12/29/2020 from Dr Ronnald Ramp for a sleep consult.  Chief concern according to patient :  Pt has had last SS 12/2016. Had an AHI of only 3/h. Here because he is still having problems with broken sleep and having to take naps throughout the day. He states he will go to sleep 11 and wake up around 2 then have a hard time falling back asleep. Can go back to sleep 4-6 AM  and that seems to be his best sleep. He takes up to 3 naps a day.    I have the pleasure of seeing Graig Hessling today, a right-handed [place] or [place] American male with a possible sleep disorder, who has a past medical history of Asthma, Diabetes mellitus without complication ([place]), and Environmental allergies.Marland Kitchen  He was never diagnosed with any apnea, and never been prescribed CPAP. Wife as not noted any snoring . He sleeps deeply.      Sleep relevant medical history: Nocturia 1-3, no Tonsillectomy- still has wisdom teeth, No deviated septum.   Family medical /sleep history: one other family member on CPAP with OSA,. His maternal aunt.    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 he lives in a  household with 4 persons/ alone. Family status is married , with 2 children.  The patient currently works/ in an office.   Tobacco use, never .  ETOH use never , Caffeine intake in form of Coffee( /) Soda( 2) Tea ( 2-3) or energy drinks. Regular exercise -none- sedentary .        Sleep habits are as follows: The patient's dinner time is between 7-9 PM. The patient goes to bed at 12 PM and continues to sleep for 5 hours, wakes for 1-3 bathroom breaks. Some additional arousals from vivid dream.  The preferred sleep position is on his side - he chokes when sleeping in  supine , with the support of 1 pillow-  Dreams are reportedly frequent. 6  AM is the usual rise time. The patient wakes up with an alarm.  He reports initially feeling refreshed or restored in AM, with symptoms such as dry mouth, morning headaches, and residual fatigue.  Naps are taken frequently, on weekends,  lasting from 45 to 60 minutes and are more refreshing than nocturnal sleep. On work days he may take power-naps.    Review of Systems: Out of a complete 14 system review, the patient complains of only the following  symptoms, and all other reviewed systems are negative.:  Fatigue, sleepiness , fragmented sleep, no snoring-    How likely are you to doze in the following situations: 0 = not likely, 1 = slight chance, 2 = moderate chance, 3 = high chance   Sitting and Reading? Watching Television? Sitting inactive in a public place (theater or meeting)? As a passenger in a car for an hour without a break? Lying down in the afternoon when circumstances permit? Sitting and talking to someone? Sitting quietly after lunch without alcohol? In a car, while stopped for a few minutes in traffic?   Total = 14/ 24 points  with many naps in daytime, has now DM and is on insulin- cant drink soda without getting sleepy, hyperglycemia. ? After each meal he is very tired.   FSS endorsed at 30/ 63 points.   Social History    Socioeconomic History   Marital status: Married    Spouse name: Not on file   Number of children: Not on file   Years of education: Not on file   Highest education level: Not on file  Occupational History   Not on file  Tobacco Use   Smoking status: Never   Smokeless tobacco: Never  Substance and Sexual Activity   Alcohol use: No   Drug use: No   Sexual activity: Yes  Other Topics Concern   Not on file  Social History Narrative   Caffienated drinks-yes   Seat belt use often-yes   Regular Exercise-no   Smoke alarm in the home-yes   Firearms/guns in the home-no   History of physical abuse-no               Social Determinants of Health   Financial Resource Strain: Not on file  Food Insecurity: Not on file  Transportation Needs: Not on file  Physical Activity: Not on file  Stress: Not on file  Social Connections: Not on file    Family History  Problem Relation Age of Onset   Alcohol abuse Other    Arthritis Other    Hypertension Other    Diabetes Other    Obesity Mother    Hypertension Mother    Obesity Father    Hypertension Father    Cancer Neg Hx    Heart disease Neg Hx    Stroke Neg Hx    Kidney disease Neg Hx     Past Medical History:  Diagnosis Date   Asthma    Diabetes mellitus without complication ([place])    Environmental allergies     No past surgical history on file.   Current Outpatient Medications on File Prior to Visit  Medication Sig Dispense Refill   Blood Glucose Monitoring Suppl ([place]) w/Device KIT 1 Act by Does not apply route 3 (three) times daily. 2 kit 0   Candesartan Cilexetil-HCTZ 32-25 MG TABS TAKE 1 TABLET BY MOUTH ONCE DAILY 90 tablet 0   Cholecalciferol 50 MCG (2000 UT) TABS Take 1 tablet (2,000 Units total) by mouth daily. 90 tablet 3   Continuous Blood Gluc Receiver (DEXCOM G6 RECEIVER) DEVI 1 Act by Does not apply route daily. 1 each 5   Continuous Blood Gluc Sensor (DEXCOM G6 SENSOR) MISC 1 Act by  Does not apply route daily. 1 each 5   Continuous Blood Gluc Transmit (DEXCOM G6 TRANSMITTER) MISC Use as directed 1 each 5   Dapagliflozin-metFORMIN HCl ER 10-998 MG TB24 TAKE 2 TABLETS BY MOUTH DAILY. 180 tablet  3   Dulaglutide (TRULICITY) 1.5 PI/9.5JO SOPN Inject 1.5 mg into the skin once a week. 2 mL 0   glucose blood test strip USE AS DIRECTED 2 TIMES DAILY IN THE MORNING AND AT BEDTIME (Patient taking differently: USE AS DIRECTED 2 TIMES DAILY IN THE MORNING AND AT BEDTIME) 200 strip 3   insulin degludec (TRESIBA) 200 UNIT/ML FlexTouch Pen INJECT 200 UNITS INTO THE SKIN DAILY. 90 mL 3   Insulin Pen Needle 32G X 6 MM MISC Use as directed morning, noon, in the evening and at bedtime. 300 each 1   Lancets (ONETOUCH DELICA PLUS [id number]) MISC USE TO CHECK BLOOD GLUCOSE THREE TIMES DAILY 100 each 1   OneTouch Delica Lancets 16W MISC USE AS DIRECTED 2 TIMES DAILY 100 each 1   rosuvastatin (CRESTOR) 10 MG tablet TAKE 1 TABLET BY MOUTH ONCE DAILY 90 tablet 1   tiZANidine (ZANAFLEX) 2 MG tablet Take 1 tablet (2 mg total) by mouth every 8 (eight) hours as needed for muscle spasms. 90 tablet 1   No current facility-administered medications on file prior to visit.    No Known Allergies  Physical exam:  Today's Vitals   12/29/20 1525  BP: 132/76  Pulse: 100  Weight: (!) 363 lb (164.7 kg)  Height: _0  (1.956 m)   Body mass index is 43.05 kg/m.   Wt Readings from Last 3 Encounters:  12/29/20 (!) 363 lb (164.7 kg)  10/26/20 (!) 358 lb 9.6 oz (162.7 kg)  10/18/20 (!) 357 lb 12.8 oz (162.3 kg)     Ht Readings from Last 3 Encounters:  12/29/20 _1  (1.956 m)  10/26/20 _2  (1.956 m)  10/18/20 _3  (1.956 m)      General: The patient is awake, alert and appears not in acute distress. The patient is well groomed. Head: Normocephalic, atraumatic. Neck is supple. Mallampati 2,  neck circumference:19 inches . Nasal airflow  patent.  Retrognathia is not seen.  Dental status:   Cardiovascular:  Regular rate and cardiac rhythm by pulse,  without distended neck veins. Respiratory: Lungs are clear to auscultation.  Skin:  Without evidence of ankle edema, or rash. Trunk: The patient's posture is erect.   Neurologic exam : The patient is awake and alert, oriented to place and time.   Memory subjective described as intact.  Attention span & concentration ability appears normal.  Speech is fluent,  without  dysarthria, dysphonia or aphasia.  Mood and affect are appropriate.   Cranial nerves: no loss of smell or taste reported  Pupils are equal and briskly reactive to light. Funduscopic exam deferred. .  Extraocular movements in vertical and horizontal planes were intact and without nystagmus. No Diplopia. Visual fields by finger perimetry are intact. Hearing was intact to soft voice and finger rubbing.    Facial sensation intact to fine touch.  Facial motor strength is symmetric and tongue and uvula move midline.  Neck ROM : rotation, tilt and flexion extension were normal for age and shoulder shrug was symmetrical.    Motor exam:  Symmetric bulk, tone and ROM.   Normal tone without cog wheeling, symmetric grip strength .   Sensory:  Fine touch, pinprick and vibration were tested  and  normal.  Proprioception tested in the upper extremities was normal.   Coordination: Rapid alternating movements in the fingers/hands were of normal speed.  The Finger-to-nose maneuver was intact without evidence of ataxia, dysmetria or tremor.   Gait and station: Patient could rise  unassisted from a seated position, walked without assistive device.  Stance is of normal width/ base and the patient turned with 3 steps.  Toe and heel walk were deferred.  Deep tendon reflexes: in the  upper and lower extremities are symmetric and intact.  Babinski response was deferred .       After spending a total time of  30  minutes face to face and additional time for physical and neurologic  examination, review of laboratory studies,  personal review of imaging studies, reports and results of other testing and review of referral information / records as far as provided in visit, I have established the following assessments:  1) Mr. Anthis has the same risk factors for OSA that were present in 2018, and he has not been snoring- confirmed by wife. 2) he has slept well when he was in lab 2018- I will offer a HST this time for confirmatory testing.  3) hypersomnia is not average - He takes daily power naps, and by 3.30 Pm he is drowsy enough to nap in his office before he drives home. His commute is 45 minutes.  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 Vivid dreams. EDS-    My Plan is to proceed with:  1) may need medication to keep awake in daytime- not of the classic stimulants.  2)  HLA narcolepsy testing.  3) HST or PSG for new apnea screening.   I would like to thank Janith Lima, MD and Janith Lima, [address] [place],  [place] [address] for allowing me to meet with and to take care of this pleasant patient.   In short, Jayion Schneck is presenting with hypersomnia. I plan to follow up either personally or through our NP within 2-4  month.   CC: I will share my notes with PCP.   Electronically signed by: Larey Seat, MD 12/29/2020 3:29 PM  [facility] Board certified by The [employer] of Sleep Medicine and Diplomate of the [employer] of Sleep Medicine. Board certified In Neurology through the [place], Fellow of the [employer] of Neurology. Medical Director of [facility].

## 2020-12-29 NOTE — Addendum Note (Signed)
Addended by: Melvyn Novas on: 12/29/2020 04:02 PM   Modules accepted: Orders

## 2020-12-29 NOTE — Patient Instructions (Signed)
Hypersomnia Hypersomnia is a condition in which a person feels very tired during the day even though he or she gets plenty of sleep at night. A person with this condition may take naps during the day and may find it very difficult to wake up from sleep. Hypersomnia may affect a person's ability to think, concentrate,drive, or remember things. What are the causes? The cause of this condition may not be known. Possible causes include: Certain medicines. Sleep disorders, such as narcolepsy and sleep apnea. Injury to the head, brain, or spinal cord. Drug or alcohol use. Gastroesophageal reflux disease (GERD). Tumors. Certain medical conditions, such as depression, diabetes, or an underactive thyroid gland (hypothyroidism). What are the signs or symptoms? The main symptoms of hypersomnia include: Feeling very tired throughout the day, regardless of how much sleep you got the night before. Having trouble waking up. Others may find it difficult to wake you up when you are sleeping. Sleeping for longer and longer periods at a time. Taking naps throughout the day. Other symptoms may include: Feeling restless, anxious, or annoyed. Lacking energy. Having trouble with: Remembering. Speaking. Thinking. Loss of appetite. Seeing, hearing, tasting, smelling, or feeling things that are not real (hallucinations). How is this diagnosed? This condition may be diagnosed based on: Your symptoms and medical history. Your sleeping habits. Your health care provider may ask you to write down your sleeping habits in a daily sleep log, along with any symptoms you have. A series of tests that are done while you sleep (sleep study or polysomnogram). A test that measures how quickly you can fall asleep during the day (daytime nap study or multiple sleep latency test). How is this treated? Treatment can help you manage your condition. Treatment may include: Following a regular sleep routine. Lifestyle changes,  such as changing your eating habits, getting regular exercise, and avoiding alcohol or caffeinated beverages. Taking medicines to make you more alert (stimulants) during the day. Treating any underlying medical causes of hypersomnia. Follow these instructions at home: Sleep routine  Schedule the same bedtime and wake-up time each day. Practice a relaxing bedtime routine. This may include reading, meditation, deep breathing, or taking a warm bath before going to sleep. Get regular exercise each day. Avoid strenuous exercise in the evening hours. Keep your sleep environment at a cooler temperature, darkened, and quiet. Sleep with pillows and a mattress that are comfortable and supportive. Schedule short 20-minute naps for when you feel sleepiest during the day. Talk with your employer or teachers about your hypersomnia. If possible, adjust your schedule so that: You have a regular daytime work schedule. You can take a scheduled nap during the day. You do not have to work or be active at night. Do not eat a heavy meal for a few hours before bedtime. Eat your meals at about the same times every day. Avoid drinking alcohol or caffeinated beverages.  Safety  Do not drive or use heavy machinery if you are sleepy. Ask your health care provider if it is safe for you to drive. Wear a life jacket when swimming or spending time near water.  General instructions Take supplements and over-the-counter and prescription medicines only as told by your health care provider. Keep a sleep log that will help your doctor manage your condition. This may include information about: What time you go to bed each night. How often you wake up at night. How many hours you sleep at night. How often and for how long you nap during the   day. Any observations from others, such as leg movements during sleep, sleep walking, or snoring. Keep all follow-up visits as told by your health care provider. This is  important. Contact a health care provider if: You have new symptoms. Your symptoms get worse. Get help right away if: You have serious thoughts about hurting yourself or someone else. If you ever feel like you may hurt yourself or others, or have thoughts about taking your own life, get help right away. You can go to your nearest emergency department or call: Your local emergency services (911 in the [place] A suicide crisis helpline, such as the National Suicide Prevention Lifeline at 1-800-273-8255. This is open 24 hours a day. Summary Hypersomnia refers to a condition in which you feel very tired during the day even though you get plenty of sleep at night. A person with this condition may take naps during the day and may find it very difficult to wake up from sleep. Hypersomnia may affect a person's ability to think, concentrate, drive, or remember things. Treatment, such as following a regular sleep routine and making some lifestyle changes, can help you manage your condition. This information is not intended to replace advice given to you by your health care provider. Make sure you discuss any questions you have with your healthcare provider. Document Revised: 04/28/2020 Document Reviewed: 04/28/2020 Elsevier Patient Education  2022 [employer] Inc.  

## 2021-01-04 ENCOUNTER — Other Ambulatory Visit: Payer: Self-pay

## 2021-01-04 ENCOUNTER — Other Ambulatory Visit (HOSPITAL_COMMUNITY): Payer: Self-pay

## 2021-01-04 ENCOUNTER — Ambulatory Visit: Payer: BC Managed Care – PPO | Admitting: Endocrinology

## 2021-01-04 VITALS — BP 140/80 | HR 85 | Ht 77.0 in | Wt 364.4 lb

## 2021-01-04 DIAGNOSIS — E1129 Type 2 diabetes mellitus with other diabetic kidney complication: Secondary | ICD-10-CM | POA: Diagnosis not present

## 2021-01-04 DIAGNOSIS — E1169 Type 2 diabetes mellitus with other specified complication: Secondary | ICD-10-CM

## 2021-01-04 DIAGNOSIS — R809 Proteinuria, unspecified: Secondary | ICD-10-CM | POA: Diagnosis not present

## 2021-01-04 DIAGNOSIS — E669 Obesity, unspecified: Secondary | ICD-10-CM | POA: Diagnosis not present

## 2021-01-04 LAB — POCT GLYCOSYLATED HEMOGLOBIN (HGB A1C): Hemoglobin A1C: 9.3 % — AB (ref 4.0–5.6)

## 2021-01-04 MED ORDER — REPAGLINIDE 1 MG PO TABS
1.0000 mg | ORAL_TABLET | Freq: Two times a day (BID) | ORAL | 3 refills | Status: DC
  Filled 2021-01-04: qty 180, 90d supply, fill #0

## 2021-01-04 MED ORDER — TRULICITY 1.5 MG/0.5ML ~~LOC~~ SOAJ
1.5000 mg | SUBCUTANEOUS | 3 refills | Status: DC
  Filled 2021-01-04: qty 6, 84d supply, fill #0
  Filled 2021-01-21: qty 2, 28d supply, fill #0

## 2021-01-04 NOTE — Patient Instructions (Addendum)
I have sent a prescription to your pharmacy, to add repaglinide with breakfast and supper. Please continue the same Xigduo and Trulicity.   A different type of diabetes blood test is requested for you today.  We'll let you know about the results.  check your blood sugar twice a day.  vary the time of day when you check, between before the 3 meals, and at bedtime.  also check if you have symptoms of your blood sugar being too high or too low.  please keep a record of the readings and bring it to your next appointment here (or you can bring the meter itself).  You can write it on any piece of paper.  please call us sooner if your blood sugar goes below 70, or if you have a lot of readings over 200.   Please come back for a follow-up appointment in 2 months.

## 2021-01-04 NOTE — Progress Notes (Signed)
Subjective:    Patient ID: Stephen Hall, male    DOB: 1980-10-14, 40 y.o.   MRN: [id number]  HPI Pt returns for f/u of diabetes mellitus: DM type: Insulin-requiring type 2 Dx'ed: 7628 Complications: none Therapy: insulin since 2017, and 2 oral meds.   DKA: never Severe hypoglycemia: never.   Pancreatitis: never.   SDOH: Pt says he sometimes misses DM meds, although he has a $0 copay.   Other: he declines multiple daily injections; he works as Futures trader; he is pursuing weight loss surgery; he did not tolerate Soliqua (abd bloating); he uses dexcom CGM.   Interval history: pt says he never misses the insulin.  pt states he feels well in general. I reviewed continuous glucose monitor data.  Glucose varies from 40-290.  It is in general highest at 11AM and 8PM.  It is lowest at 6AM.  He says Trulicity is just 3.15 mg weekly.   Past Medical History:  Diagnosis Date   Asthma    Diabetes mellitus without complication ([place])    Environmental allergies     No past surgical history on file.  Social History   Socioeconomic History   Marital status: Married    Spouse name: Not on file   Number of children: Not on file   Years of education: Not on file   Highest education level: Not on file  Occupational History   Not on file  Tobacco Use   Smoking status: Never   Smokeless tobacco: Never  Substance and Sexual Activity   Alcohol use: No   Drug use: No   Sexual activity: Yes  Other Topics Concern   Not on file  Social History Narrative   Caffienated drinks-yes   Seat belt use often-yes   Regular Exercise-no   Smoke alarm in the home-yes   Firearms/guns in the home-no   History of physical abuse-no               Social Determinants of Health   Financial Resource Strain: Not on file  Food Insecurity: Not on file  Transportation Needs: Not on file  Physical Activity: Not on file  Stress: Not on file  Social Connections: Not on file  Intimate Partner  Violence: Not on file    Current Outpatient Medications on File Prior to Visit  Medication Sig Dispense Refill   Armodafinil 250 MG tablet Take 1 tablet (250 mg total) by mouth daily. 30 tablet 1   Blood Glucose Monitoring Suppl ([place]) w/Device KIT 1 Act by Does not apply route 3 (three) times daily. 2 kit 0   Candesartan Cilexetil-HCTZ 32-25 MG TABS TAKE 1 TABLET BY MOUTH ONCE DAILY 90 tablet 0   Cholecalciferol 50 MCG (2000 UT) TABS Take 1 tablet (2,000 Units total) by mouth daily. 90 tablet 3   Continuous Blood Gluc Receiver (DEXCOM G6 RECEIVER) DEVI 1 Act by Does not apply route daily. 1 each 5   Continuous Blood Gluc Sensor (DEXCOM G6 SENSOR) MISC 1 Act by Does not apply route daily. 1 each 5   Continuous Blood Gluc Transmit (DEXCOM G6 TRANSMITTER) MISC Use as directed 1 each 5   Dapagliflozin-metFORMIN HCl ER 10-998 MG TB24 TAKE 2 TABLETS BY MOUTH DAILY. 180 tablet 3   glucose blood test strip USE AS DIRECTED 2 TIMES DAILY IN THE MORNING AND AT BEDTIME (Patient taking differently: USE AS DIRECTED 2 TIMES DAILY IN THE MORNING AND AT BEDTIME) 200 strip 3   insulin degludec (TRESIBA)  200 UNIT/ML FlexTouch Pen INJECT 200 UNITS INTO THE SKIN DAILY. 90 mL 3   Insulin Pen Needle 32G X 6 MM MISC Use as directed morning, noon, in the evening and at bedtime. 300 each 1   Lancets (ONETOUCH DELICA PLUS [id number]) MISC USE TO CHECK BLOOD GLUCOSE THREE TIMES DAILY 100 each 1   OneTouch Delica Lancets 38V MISC USE AS DIRECTED 2 TIMES DAILY 100 each 1   rosuvastatin (CRESTOR) 10 MG tablet TAKE 1 TABLET BY MOUTH ONCE DAILY 90 tablet 1   tiZANidine (ZANAFLEX) 2 MG tablet Take 1 tablet (2 mg total) by mouth every 8 (eight) hours as needed for muscle spasms. 90 tablet 1   No current facility-administered medications on file prior to visit.    No Known Allergies  Family History  Problem Relation Age of Onset   Alcohol abuse Other    Arthritis Other    Hypertension Other     Diabetes Other    Obesity Mother    Hypertension Mother    Obesity Father    Hypertension Father    Cancer Neg Hx    Heart disease Neg Hx    Stroke Neg Hx    Kidney disease Neg Hx     BP 140/80 (BP Location: Right Arm, Patient Position: Sitting, Cuff Size: Large)   Pulse 85   Ht '6\' 5"'  (1.956 m)   Wt (!) 364 lb 6.4 oz (165.3 kg)   SpO2 98%   BMI 43.21 kg/m    Review of Systems He says trulicity causes abd bloating.     Objective:   Physical Exam Pulses: dorsalis pedis intact bilat.   MSK: no deformity of the feet CV: no leg edema Skin:  no ulcer on the feet.  normal color and temp on the feet. Neuro: sensation is intact to touch on the feet    Lab Results  Component Value Date   HGBA1C 9.3 (A) 01/04/2021       Assessment & Plan:  Insulin-requiring type 2 DM:  The pattern of his cbg's indicates he needs to add a fast-acting insulin, but he declines.   Abd bloating, due to trulicity: we can't increase now.    Patient Instructions  I have sent a prescription to your pharmacy, to add repaglinide with breakfast and supper. Please continue the same Xigduo and Trulicity.   A different type of diabetes blood test is requested for you today.  We'll let you know about the results.  check your blood sugar twice a day.  vary the time of day when you check, between before the 3 meals, and at bedtime.  also check if you have symptoms of your blood sugar being too high or too low.  please keep a record of the readings and bring it to your next appointment here (or you can bring the meter itself).  You can write it on any piece of paper.  please call us sooner if your blood sugar goes below 70, or if you have a lot of readings over 200.   Please come back for a follow-up appointment in 2 months.

## 2021-01-05 ENCOUNTER — Other Ambulatory Visit (HOSPITAL_COMMUNITY): Payer: Self-pay

## 2021-01-09 LAB — NARCOLEPSY EVALUATION
DQA1*01:02: NEGATIVE
DQB1*06:02: NEGATIVE

## 2021-01-10 ENCOUNTER — Telehealth: Payer: Self-pay | Admitting: Neurology

## 2021-01-10 NOTE — Telephone Encounter (Signed)
-----  Message from Larey Seat, MD sent at 01/10/2021  9:26 AM EDT ----- Negative HLA for narcolepsy trait.

## 2021-01-10 NOTE — Telephone Encounter (Signed)
Called the patient to advise that the lab results indicated that he was negative for the narcolepsy gene.

## 2021-01-10 NOTE — Progress Notes (Signed)
Negative HLA for narcolepsy trait.

## 2021-01-16 ENCOUNTER — Other Ambulatory Visit (HOSPITAL_COMMUNITY): Payer: Self-pay

## 2021-01-17 ENCOUNTER — Other Ambulatory Visit (HOSPITAL_COMMUNITY): Payer: Self-pay

## 2021-01-18 ENCOUNTER — Telehealth: Payer: Self-pay | Admitting: Neurology

## 2021-01-18 NOTE — Telephone Encounter (Signed)
Received a PA request for the patient for Armodafinil.  At this time this can not be filled through the pt's insurance because there is no diagnosis that would get the medication covered.  If pt calls in inquiring, please advise we can send the script to [facility] or [facility] for him to use a goodrx coupon. That is where he would get the better price for the medication

## 2021-01-21 ENCOUNTER — Other Ambulatory Visit (HOSPITAL_COMMUNITY): Payer: Self-pay

## 2021-01-21 ENCOUNTER — Other Ambulatory Visit: Payer: Self-pay | Admitting: Internal Medicine

## 2021-01-21 DIAGNOSIS — E1169 Type 2 diabetes mellitus with other specified complication: Secondary | ICD-10-CM

## 2021-01-21 DIAGNOSIS — E785 Hyperlipidemia, unspecified: Secondary | ICD-10-CM

## 2021-01-21 MED ORDER — ROSUVASTATIN CALCIUM 10 MG PO TABS
ORAL_TABLET | Freq: Every day | ORAL | 1 refills | Status: DC
Start: 2021-01-21 — End: 2021-07-19
  Filled 2021-01-21: qty 90, 90d supply, fill #0
  Filled 2021-06-21: qty 90, 90d supply, fill #1

## 2021-01-21 MED FILL — Insulin Degludec Soln Pen-Injector 200 Unit/ML: SUBCUTANEOUS | 30 days supply | Qty: 30 | Fill #1 | Status: CN

## 2021-01-21 MED FILL — Insulin Degludec Soln Pen-Injector 200 Unit/ML: SUBCUTANEOUS | 30 days supply | Qty: 30 | Fill #1 | Status: AC

## 2021-01-23 ENCOUNTER — Other Ambulatory Visit (HOSPITAL_COMMUNITY): Payer: Self-pay

## 2021-01-23 ENCOUNTER — Ambulatory Visit: Payer: BC Managed Care – PPO | Admitting: Neurology

## 2021-01-23 DIAGNOSIS — F518 Other sleep disorders not due to a substance or known physiological condition: Secondary | ICD-10-CM

## 2021-01-23 DIAGNOSIS — G4753 Recurrent isolated sleep paralysis: Secondary | ICD-10-CM

## 2021-01-23 DIAGNOSIS — E1169 Type 2 diabetes mellitus with other specified complication: Secondary | ICD-10-CM

## 2021-01-23 DIAGNOSIS — G4719 Other hypersomnia: Secondary | ICD-10-CM

## 2021-01-24 ENCOUNTER — Other Ambulatory Visit (HOSPITAL_COMMUNITY): Payer: Self-pay

## 2021-01-30 ENCOUNTER — Other Ambulatory Visit (HOSPITAL_COMMUNITY): Payer: Self-pay

## 2021-02-01 ENCOUNTER — Other Ambulatory Visit: Payer: Self-pay

## 2021-02-01 ENCOUNTER — Other Ambulatory Visit (HOSPITAL_COMMUNITY): Payer: Self-pay

## 2021-02-01 ENCOUNTER — Encounter: Payer: Self-pay | Admitting: Internal Medicine

## 2021-02-01 ENCOUNTER — Ambulatory Visit: Payer: BC Managed Care – PPO | Admitting: Internal Medicine

## 2021-02-01 VITALS — BP 134/86 | HR 95 | Temp 98.4°F | Ht 77.0 in | Wt 363.0 lb

## 2021-02-01 DIAGNOSIS — E1129 Type 2 diabetes mellitus with other diabetic kidney complication: Secondary | ICD-10-CM | POA: Diagnosis not present

## 2021-02-01 DIAGNOSIS — E119 Type 2 diabetes mellitus without complications: Secondary | ICD-10-CM

## 2021-02-01 DIAGNOSIS — I10 Essential (primary) hypertension: Secondary | ICD-10-CM | POA: Diagnosis not present

## 2021-02-01 DIAGNOSIS — R809 Proteinuria, unspecified: Secondary | ICD-10-CM

## 2021-02-01 DIAGNOSIS — Z794 Long term (current) use of insulin: Secondary | ICD-10-CM

## 2021-02-01 MED ORDER — TRULICITY 3 MG/0.5ML ~~LOC~~ SOAJ
3.0000 mg | SUBCUTANEOUS | 0 refills | Status: DC
  Filled 2021-02-01: qty 2, 28d supply, fill #0

## 2021-02-01 NOTE — Progress Notes (Signed)
Subjective:  Patient ID: Stephen Hall, male    DOB: December 23, 1980  Age: 40 y.o. MRN: [id number]  CC: Hypertension and Diabetes  This visit occurred during the SARS-CoV-2 public health emergency.  Safety protocols were in place, including screening questions prior to the visit, additional usage of staff PPE, and extensive cleaning of exam room while observing appropriate contact time as indicated for disinfecting solutions.    HPI Stephen Hall presents for f/up -  He is frustrated that his blood sugars are not better controlled.  In the last few weeks he has had 1 low blood sugar warning from his Dexcom.  It was 5:30 in the morning and his blood sugar was 55.  He is also had some spikes up to 250.  He denies polys.  He is active and denies any recent episodes of headache, blurred vision, chest pain, shortness of breath, edema, or fatigue.  Outpatient Medications Prior to Visit  Medication Sig Dispense Refill   Armodafinil 250 MG tablet Take 1 tablet (250 mg total) by mouth daily. 30 tablet 1   Blood Glucose Monitoring Suppl ([place]) w/Device KIT 1 Act by Does not apply route 3 (three) times daily. 2 kit 0   Candesartan Cilexetil-HCTZ 32-25 MG TABS TAKE 1 TABLET BY MOUTH ONCE DAILY 90 tablet 0   Cholecalciferol 50 MCG (2000 UT) TABS Take 1 tablet (2,000 Units total) by mouth daily. 90 tablet 3   Continuous Blood Gluc Receiver (DEXCOM G6 RECEIVER) DEVI 1 Act by Does not apply route daily. 1 each 5   Continuous Blood Gluc Sensor (DEXCOM G6 SENSOR) MISC 1 Act by Does not apply route daily. 1 each 5   Continuous Blood Gluc Transmit (DEXCOM G6 TRANSMITTER) MISC Use as directed 1 each 5   Dapagliflozin-metFORMIN HCl ER 10-998 MG TB24 TAKE 2 TABLETS BY MOUTH DAILY. 180 tablet 3   glucose blood test strip USE AS DIRECTED 2 TIMES DAILY IN THE MORNING AND AT BEDTIME (Patient taking differently: USE AS DIRECTED 2 TIMES DAILY IN THE MORNING AND AT BEDTIME) 200 strip 3   insulin  degludec (TRESIBA) 200 UNIT/ML FlexTouch Pen INJECT 200 UNITS INTO THE SKIN DAILY. 90 mL 3   Insulin Pen Needle 32G X 6 MM MISC Use as directed morning, noon, in the evening and at bedtime. 300 each 1   Lancets (ONETOUCH DELICA PLUS [id number]) MISC USE TO CHECK BLOOD GLUCOSE THREE TIMES DAILY 100 each 1   OneTouch Delica Lancets 53G MISC USE AS DIRECTED 2 TIMES DAILY 100 each 1   repaglinide (PRANDIN) 1 MG tablet Take 1 tablet (1 mg total) by mouth 2 (two) times daily before a meal. (breakfast and supper) 180 tablet 3   rosuvastatin (CRESTOR) 10 MG tablet TAKE 1 TABLET BY MOUTH ONCE DAILY 90 tablet 1   tiZANidine (ZANAFLEX) 2 MG tablet Take 1 tablet (2 mg total) by mouth every 8 (eight) hours as needed for muscle spasms. 90 tablet 1   Dulaglutide (TRULICITY) 1.5 UY/4.0HK SOPN Inject 1.5 mg into the skin once a week. 6 mL 3   No facility-administered medications prior to visit.    ROS Review of Systems  Constitutional:  Negative for diaphoresis and fatigue.  HENT: Negative.    Eyes: Negative.   Respiratory:  Negative for cough, chest tightness, shortness of breath and wheezing.   Cardiovascular:  Negative for chest pain, palpitations and leg swelling.  Gastrointestinal:  Negative for abdominal pain, constipation, diarrhea and nausea.  Endocrine: Negative.  Negative for polyphagia and polyuria.  Genitourinary: Negative.  Negative for difficulty urinating.  Musculoskeletal: Negative.  Negative for arthralgias and myalgias.  Skin: Negative.   Neurological:  Negative for dizziness, weakness and light-headedness.  Hematological:  Negative for adenopathy. Does not bruise/bleed easily.  Psychiatric/Behavioral: Negative.     Objective:  BP 134/86 (BP Location: Left Arm, Patient Position: Sitting, Cuff Size: Large)   Pulse 95   Temp 98.4 F (36.9 C) (Oral)   Ht '6\' 5"'  (1.956 m)   Wt (!) 363 lb (164.7 kg)   SpO2 97%   BMI 43.05 kg/m   BP Readings from Last 3 Encounters:  02/01/21 134/86   01/04/21 140/80  12/29/20 132/76    Wt Readings from Last 3 Encounters:  02/01/21 (!) 363 lb (164.7 kg)  01/04/21 (!) 364 lb 6.4 oz (165.3 kg)  12/29/20 (!) 363 lb (164.7 kg)    Physical Exam Vitals reviewed.  HENT:     Nose: Nose normal.     Mouth/Throat:     Mouth: Mucous membranes are moist.  Eyes:     Conjunctiva/sclera: Conjunctivae normal.  Cardiovascular:     Rate and Rhythm: Normal rate and regular rhythm.     Heart sounds: No murmur heard. Pulmonary:     Effort: Pulmonary effort is normal.     Breath sounds: No stridor. No wheezing, rhonchi or rales.  Abdominal:     General: Abdomen is flat.     Palpations: There is no mass.     Tenderness: There is no abdominal tenderness. There is no guarding.  Musculoskeletal:        General: Normal range of motion.     Cervical back: Neck supple.     Right lower leg: No edema.     Left lower leg: No edema.  Lymphadenopathy:     Cervical: No cervical adenopathy.  Skin:    General: Skin is warm and dry.  Neurological:     General: No focal deficit present.     Mental Status: He is alert.    Lab Results  Component Value Date   WBC 8.5 10/18/2020   HGB 14.9 10/18/2020   HCT 45.7 10/18/2020   PLT 282.0 10/18/2020   GLUCOSE 120 (H) 10/18/2020   CHOL 143 10/18/2020   TRIG 93.0 10/18/2020   HDL 34.40 (L) 10/18/2020   LDLDIRECT 158.1 02/07/2012   LDLCALC 90 10/18/2020   ALT 23 10/18/2020   AST 15 10/18/2020   NA 138 10/18/2020   K 3.8 10/18/2020   CL 100 10/18/2020   CREATININE 0.89 10/18/2020   BUN 9 10/18/2020   CO2 28 10/18/2020   TSH 2.83 10/18/2020   HGBA1C 9.3 (A) 01/04/2021   MICROALBUR <0.7 10/18/2020    No results found.  Assessment & Plan:   Stephen Hall was seen today for hypertension and diabetes.  Diagnoses and all orders for this visit:  Type 2 diabetes mellitus with microalbuminuria, without long-term current use of insulin ([place])- His A1c remains too high.  Will increase the dose of the GLP-1  agonist.  I have also recommended that he consider bariatric surgery. -     Dulaglutide (TRULICITY) 3 OJ/5.0KX SOPN; Inject 3 mg once a week as directed.  Morbid obesity ([place]) -     Ambulatory referral to General Surgery  Essential hypertension- He has not achieved his blood pressure goal of 130/80.  Will continue the current antihypertensives and I have encouraged him to improve his lifestyle modifications.  I have discontinued  Rexton Castille's Trulicity. I am also having him start on Trulicity and Gvoke HypoPen 2-Pack. Additionally, I am having him maintain his [place], OneTouch Delica Plus [id number], OneTouch Delica Lancets 62M, glucose blood, insulin degludec, Dapagliflozin-metFORMIN HCl ER, Candesartan Cilexetil-HCTZ, Dexcom G6 Receiver, Dexcom G6 Sensor, Insulin Pen Needle, Cholecalciferol, tiZANidine, Dexcom G6 Transmitter, Armodafinil, repaglinide, and rosuvastatin.  Meds ordered this encounter  Medications   Dulaglutide (TRULICITY) 3 MN/8.1RR SOPN    Sig: Inject 3 mg once a week as directed.    Dispense:  2 mL    Refill:  0   Glucagon (GVOKE HYPOPEN 2-PACK) 1 MG/0.2ML SOAJ    Sig: Inject 1 Act into the skin daily as needed.    Dispense:  2 mL    Refill:  5     Follow-up: No follow-ups on file.  Scarlette Calico, MD

## 2021-02-02 ENCOUNTER — Other Ambulatory Visit (HOSPITAL_COMMUNITY): Payer: Self-pay

## 2021-02-02 MED ORDER — GVOKE HYPOPEN 2-PACK 1 MG/0.2ML ~~LOC~~ SOAJ
1.0000 | Freq: Every day | SUBCUTANEOUS | 5 refills | Status: DC | PRN
  Filled 2021-02-02: qty 0.4, 30d supply, fill #0

## 2021-02-03 ENCOUNTER — Other Ambulatory Visit (HOSPITAL_COMMUNITY): Payer: Self-pay

## 2021-02-06 ENCOUNTER — Other Ambulatory Visit (HOSPITAL_COMMUNITY): Payer: Self-pay

## 2021-02-14 ENCOUNTER — Other Ambulatory Visit: Payer: Self-pay | Admitting: Internal Medicine

## 2021-02-14 ENCOUNTER — Other Ambulatory Visit (HOSPITAL_COMMUNITY): Payer: Self-pay

## 2021-02-14 DIAGNOSIS — E1129 Type 2 diabetes mellitus with other diabetic kidney complication: Secondary | ICD-10-CM

## 2021-02-14 DIAGNOSIS — E669 Obesity, unspecified: Secondary | ICD-10-CM

## 2021-02-14 MED FILL — Continuous Glucose System Sensor: 30 days supply | Qty: 3 | Fill #0 | Status: AC

## 2021-02-15 ENCOUNTER — Other Ambulatory Visit (HOSPITAL_COMMUNITY): Payer: Self-pay

## 2021-02-20 ENCOUNTER — Ambulatory Visit (INDEPENDENT_AMBULATORY_CARE_PROVIDER_SITE_OTHER): Payer: BC Managed Care – PPO | Admitting: Neurology

## 2021-02-20 DIAGNOSIS — G4733 Obstructive sleep apnea (adult) (pediatric): Secondary | ICD-10-CM

## 2021-02-20 DIAGNOSIS — E1169 Type 2 diabetes mellitus with other specified complication: Secondary | ICD-10-CM

## 2021-02-20 DIAGNOSIS — G4719 Other hypersomnia: Secondary | ICD-10-CM

## 2021-02-20 DIAGNOSIS — F518 Other sleep disorders not due to a substance or known physiological condition: Secondary | ICD-10-CM

## 2021-02-20 DIAGNOSIS — G4753 Recurrent isolated sleep paralysis: Secondary | ICD-10-CM

## 2021-02-20 DIAGNOSIS — E669 Obesity, unspecified: Secondary | ICD-10-CM

## 2021-02-22 ENCOUNTER — Telehealth: Payer: Self-pay

## 2021-02-22 NOTE — Telephone Encounter (Signed)
Called patient to find out why he has not returned home sleep test. Pt forgot to wear device on Monday night and Tuesday night. Instructed pt that he had to wear it tonight and HST has to be returned no later than 9am tmrw morning.

## 2021-02-27 NOTE — Progress Notes (Signed)
      [facility] SLEEP TEST REPORT ( by [facility] PAT)   STUDY DATE:  8-24/ 02-27-21 DOB:  [birth year] MRN: [id number]   ORDERING CLINICIAN: Melvyn Novas, MD  REFERRING CLINICIAN: Etta Grandchild, MD    CLINICAL INFORMATION/HISTORY: Stephen Hall is a 40 year -old  African American male patient and was seen in a referral on 12/29/2020 from Dr Yetta Barre for a sleep consult.  Pt has had a previous sleep study in 12/2016 with an AHI of only 3/h. Here because he is still having problems with  sleep and having to nap throughout the day. He states he will go to sleep 11 and wake up around 2 then have a hard time falling back asleep. Can go back to sleep 4-6 AM  and that seems to be his best sleep. He takes up to 3 naps a day.    Stephen Hall has a past medical history of Asthma, Diabetes mellitus without complication (H[facility]), and Environmental allergies.Marland Kitchen He was never diagnosed with any apnea, and never been prescribed CPAP. Wife has not noted any snoring . He sleeps deep and dreams in AM.    Epworth sleepiness score: 14/24.   BMI: 43 kg/m   Neck Circumference: 19"   Sleep Summary: Total Recording Time (hours, min): The total recording time amounted to 7 hours 10 minutes of which 6 hours 13 minutes were total sleep time.  REM sleep was 20.6% of total sleep time.       Respiratory Indices:  The calculated AHI was 14.7/h and almost equally distributed between REM and non-REM sleep.  The RDI was 18.1/h so there is an additional component of snoring related arousals.   Sleep position also mattered improved sleep the patient had significantly more apnea than in supine sleep or on his right side.   Prone sleep AHI was 28.8/h and supine AHI 14/h.  Snoring level averaged 41 dB.     Oxygen Saturation Statistics:   Oxygen Saturation (%) Mean: Oxygen saturation varied between a nadir of 88% and a maximum saturation of 99% with a mean oxygen level of 95%.           Total time in hypoxia  under 89% saturation was only 0.2 minutes and therefore clinically irrelevant.  Pulse Rate Statistics: Pulse Range:   Pulse range between 67 bpm and a maximum of 107 bpm with a mean heart rate of 86 bpm.  Please note that this home sleep test cannot differentiate cardiac rhythm , only heart rate.              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  Avoiding prone sleep would reduce the apnea but not to the level where other treatment options are insignificant.   RECOMMENDATION: The overall apnea degree is mild but associated with loud snoring I would definitely recommend CPAP auto titration and would use a device that starts at 6 cmH2O goes up to 18 cm water pressure with 2 cm EPR, heated humidification and mask of patient's choice.  He should try this mask on while in supine position or reclined. Weight loss will be of utmost importance in reducing the apnea degree or converting apnea to hypopnea.    INTERPRETING PHYSICIAN: Melvyn Novas, MD   Medical Director of [facility].

## 2021-03-10 ENCOUNTER — Other Ambulatory Visit: Payer: Self-pay | Admitting: Internal Medicine

## 2021-03-10 ENCOUNTER — Other Ambulatory Visit (HOSPITAL_COMMUNITY): Payer: Self-pay

## 2021-03-10 DIAGNOSIS — E1169 Type 2 diabetes mellitus with other specified complication: Secondary | ICD-10-CM

## 2021-03-10 DIAGNOSIS — I10 Essential (primary) hypertension: Secondary | ICD-10-CM

## 2021-03-10 MED ORDER — CANDESARTAN CILEXETIL-HCTZ 32-25 MG PO TABS
1.0000 | ORAL_TABLET | Freq: Every day | ORAL | 0 refills | Status: DC
  Filled 2021-03-10: qty 90, 90d supply, fill #0

## 2021-03-11 ENCOUNTER — Other Ambulatory Visit (HOSPITAL_COMMUNITY): Payer: Self-pay

## 2021-03-13 ENCOUNTER — Other Ambulatory Visit: Payer: Self-pay | Admitting: Neurology

## 2021-03-13 ENCOUNTER — Other Ambulatory Visit (HOSPITAL_COMMUNITY): Payer: Self-pay

## 2021-03-13 DIAGNOSIS — G4753 Recurrent isolated sleep paralysis: Secondary | ICD-10-CM | POA: Insufficient documentation

## 2021-03-13 DIAGNOSIS — F518 Other sleep disorders not due to a substance or known physiological condition: Secondary | ICD-10-CM | POA: Insufficient documentation

## 2021-03-13 DIAGNOSIS — G4733 Obstructive sleep apnea (adult) (pediatric): Secondary | ICD-10-CM

## 2021-03-13 DIAGNOSIS — G4719 Other hypersomnia: Secondary | ICD-10-CM

## 2021-03-13 DIAGNOSIS — Z6841 Body Mass Index (BMI) 40.0 and over, adult: Secondary | ICD-10-CM

## 2021-03-13 DIAGNOSIS — E1169 Type 2 diabetes mellitus with other specified complication: Secondary | ICD-10-CM

## 2021-03-13 NOTE — Progress Notes (Signed)
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  Avoiding prone sleep would reduce the apnea but not to the level where other treatment options are insignificant.  RECOMMENDATION: The overall apnea degree is mild but associated with loud snoring I would definitely recommend CPAP auto titration and would use a device that starts at 6 cmH2O goes up to 18 cm water pressure with 2 cm EPR, heated humidification and mask of patient's choice.  He should try this mask on while in supine position or reclined. Weight loss will be of utmost importance in reducing the apnea degree or converting apnea to hypopnea.   INTERPRETING PHYSICIAN: Melvyn Novas, MD

## 2021-03-14 ENCOUNTER — Other Ambulatory Visit (HOSPITAL_COMMUNITY): Payer: Self-pay

## 2021-03-14 ENCOUNTER — Other Ambulatory Visit: Payer: Self-pay | Admitting: Internal Medicine

## 2021-03-14 ENCOUNTER — Telehealth: Payer: Self-pay | Admitting: Neurology

## 2021-03-14 DIAGNOSIS — R809 Proteinuria, unspecified: Secondary | ICD-10-CM

## 2021-03-14 DIAGNOSIS — E1129 Type 2 diabetes mellitus with other diabetic kidney complication: Secondary | ICD-10-CM

## 2021-03-14 MED ORDER — TRULICITY 3 MG/0.5ML ~~LOC~~ SOAJ
3.0000 mg | SUBCUTANEOUS | 0 refills | Status: DC
  Filled 2021-03-14: qty 2, 28d supply, fill #0

## 2021-03-14 MED FILL — Continuous Glucose System Sensor: 30 days supply | Qty: 3 | Fill #1 | Status: AC

## 2021-03-14 MED FILL — Insulin Degludec Soln Pen-Injector 200 Unit/ML: SUBCUTANEOUS | 30 days supply | Qty: 30 | Fill #2 | Status: AC

## 2021-03-14 NOTE — Telephone Encounter (Signed)
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  Avoiding prone sleep would reduce the apnea but not to the level where other treatment options are insignificant.  RECOMMENDATION: The overall apnea degree is mild but associated with loud snoring I would definitely recommend CPAP auto titration and would use a device that starts at 6 cmH2O goes up to 18 cm water pressure with 2 cm EPR, heated humidification and mask of patient's choice.  He should try this mask on while in supine position or reclined. Weight loss will be of utmost importance in reducing the apnea degree or converting apnea to hypopnea.   INTERPRETING PHYSICIAN: Melvyn Novas, MD

## 2021-03-14 NOTE — Telephone Encounter (Signed)
I called pt. I advised pt that Dr. Vickey Huger reviewed their sleep study results and found that pt has mild sleep apnea. Dr. Vickey Huger recommends that pt starts auto CPAP. I reviewed PAP compliance expectations with the pt. Pt is agreeable to starting a CPAP. I advised pt that an order will be sent to a DME, [facility], and [facility] will call the pt within about one week after they file with the pt's insurance. [facility] will show the pt how to use the machine, fit for masks, and troubleshoot the CPAP if needed. A follow up appt was made for insurance purposes with Dr. Vickey Huger on 06/13/2021 at 2:30 pm. Pt verbalized understanding to arrive 15 minutes early and bring their CPAP. A letter with all of this information in it will be mailed to the pt as a reminder. I verified with the pt that the address we have on file is correct. Pt verbalized understanding of results. Pt had no questions at this time but was encouraged to call back if questions arise. I have sent the order to [facility] and have received confirmation that they have received the order.

## 2021-03-16 ENCOUNTER — Other Ambulatory Visit (HOSPITAL_COMMUNITY): Payer: Self-pay

## 2021-03-22 ENCOUNTER — Ambulatory Visit: Payer: BC Managed Care – PPO | Admitting: Endocrinology

## 2021-04-26 ENCOUNTER — Ambulatory Visit: Payer: BC Managed Care – PPO | Admitting: Endocrinology

## 2021-05-16 ENCOUNTER — Other Ambulatory Visit: Payer: Self-pay | Admitting: Internal Medicine

## 2021-05-16 ENCOUNTER — Other Ambulatory Visit (HOSPITAL_COMMUNITY): Payer: Self-pay

## 2021-05-16 DIAGNOSIS — E1129 Type 2 diabetes mellitus with other diabetic kidney complication: Secondary | ICD-10-CM

## 2021-05-16 MED ORDER — TRULICITY 3 MG/0.5ML ~~LOC~~ SOAJ
3.0000 mg | SUBCUTANEOUS | 0 refills | Status: DC
  Filled 2021-05-16 (×6): qty 2, 28d supply, fill #0

## 2021-05-16 MED ORDER — OSELTAMIVIR PHOSPHATE 75 MG PO CAPS
75.0000 mg | ORAL_CAPSULE | Freq: Two times a day (BID) | ORAL | 0 refills | Status: DC
  Filled 2021-05-16: qty 10, 5d supply, fill #0

## 2021-05-16 MED FILL — Insulin Degludec Soln Pen-Injector 200 Unit/ML: SUBCUTANEOUS | 30 days supply | Qty: 30 | Fill #3 | Status: AC

## 2021-05-18 ENCOUNTER — Encounter: Payer: Self-pay | Admitting: Nurse Practitioner

## 2021-05-18 ENCOUNTER — Other Ambulatory Visit: Payer: Self-pay

## 2021-05-18 ENCOUNTER — Telehealth (INDEPENDENT_AMBULATORY_CARE_PROVIDER_SITE_OTHER): Payer: BC Managed Care – PPO | Admitting: Nurse Practitioner

## 2021-05-18 DIAGNOSIS — U071 COVID-19: Secondary | ICD-10-CM | POA: Diagnosis not present

## 2021-05-18 MED ORDER — NIRMATRELVIR/RITONAVIR (PAXLOVID)TABLET: 3.0000 | ORAL_TABLET | Freq: Two times a day (BID) | ORAL | 0 refills | Status: AC

## 2021-05-18 MED ORDER — NIRMATRELVIR/RITONAVIR (PAXLOVID)TABLET: 3.0000 | ORAL_TABLET | Freq: Two times a day (BID) | ORAL | 0 refills | Status: DC

## 2021-05-18 NOTE — Patient Instructions (Signed)
Hold your crestor (rosuvastatin) while taking this medication

## 2021-05-18 NOTE — Assessment & Plan Note (Signed)
Patient diagnosed with COVID.  Has been vaccinated without any boosters.  Given patient's body habitus and comorbidities will elect to treat him for him for having complications or severe disease.  Did discuss treatment options with patient in regards to both medications or EUA did discuss common side effects about both medications after joint discussion patient decided to use paxlovid.  Did have discussion about drug drug interactions with his rosuvastatin.  Patient will hold rosuvastatin while taking pack Slo-Bid told him he can continue the rosuvastatin the following day.  Patient acknowledged.  Continue to monitor.  Signs and symptoms reviewed as when to seek urgent or emergent health care.

## 2021-05-18 NOTE — Progress Notes (Signed)
Patient ID: Stephen Hall, male    DOB: 25-Oct-1980, 40 y.o.   MRN: [id number]  Virtual visit completed through [employer], a video enabled telemedicine application. Due to national recommendations of social distancing due to COVID-19, a virtual visit is felt to be most appropriate for this patient at this time. Reviewed limitations, risks, security and privacy concerns of performing a virtual visit and the availability of in person appointments. I also reviewed that there may be a patient responsible charge related to this service. The patient agreed to proceed.   Patient location: home Provider location: [place] at [facility], office Persons participating in this virtual visit: patient, provider   If any vitals were documented, they were collected by patient at home unless specified below.    There were no vitals taken for this visit.   CC: Covid 19 Subjective:   HPI: Stephen Hall is a 40 y.o. male presenting on 05/18/2021 for Covid Positive (On 05/17/21, sx started on 05/15/21-had slight sore throat, cough-coughing up clear and yellowish phlegm, some SOB after talking, slight body ache on 05/16/21, sneezing, sinus congestion. No fever. )  Symptoms started 05/15/2021 Tested positive 05/17/2021 Pfizer x2 Father has been sick [place] at night that helps    Relevant past medical, surgical, family and social history reviewed and updated as indicated. Interim medical history since our last visit reviewed. Allergies and medications reviewed and updated. Outpatient Medications Prior to Visit  Medication Sig Dispense Refill   Blood Glucose Monitoring Suppl ([place]) w/Device KIT 1 Act by Does not apply route 3 (three) times daily. 2 kit 0   Candesartan Cilexetil-HCTZ 32-25 MG TABS TAKE 1 TABLET BY MOUTH ONCE DAILY 90 tablet 0   Cholecalciferol 50 MCG (2000 UT) TABS Take 1 tablet (2,000 Units total) by mouth daily. 90 tablet 3   Continuous Blood Gluc Receiver  (DEXCOM G6 RECEIVER) DEVI 1 Act by Does not apply route daily. 1 each 5   Continuous Blood Gluc Sensor (DEXCOM G6 SENSOR) MISC Use as directed 3 each 5   Continuous Blood Gluc Transmit (DEXCOM G6 TRANSMITTER) MISC Use as directed 1 each 5   Dapagliflozin-metFORMIN HCl ER 10-998 MG TB24 TAKE 2 TABLETS BY MOUTH DAILY. 180 tablet 3   Dulaglutide (TRULICITY) 3 EF/0.0FH SOPN Inject 3 mg once a week as directed. 2 mL 0   glucose blood test strip USE AS DIRECTED 2 TIMES DAILY IN THE MORNING AND AT BEDTIME (Patient taking differently: USE AS DIRECTED 2 TIMES DAILY IN THE MORNING AND AT BEDTIME) 200 strip 3   insulin degludec (TRESIBA) 200 UNIT/ML FlexTouch Pen INJECT 200 UNITS INTO THE SKIN DAILY. 90 mL 3   Insulin Pen Needle 32G X 6 MM MISC Use as directed morning, noon, in the evening and at bedtime. 300 each 1   Lancets (ONETOUCH DELICA PLUS [id number]) MISC USE TO CHECK BLOOD GLUCOSE THREE TIMES DAILY 100 each 1   OneTouch Delica Lancets 54D MISC USE AS DIRECTED 2 TIMES DAILY 100 each 1   repaglinide (PRANDIN) 1 MG tablet Take 1 tablet (1 mg total) by mouth 2 (two) times daily before a meal. (breakfast and supper) 180 tablet 3   rosuvastatin (CRESTOR) 10 MG tablet TAKE 1 TABLET BY MOUTH ONCE DAILY 90 tablet 1   Glucagon (GVOKE HYPOPEN 2-PACK) 1 MG/0.2ML SOAJ Inject 1 pen into the skin daily as needed. (Patient not taking: Reported on 05/18/2021) 2 mL 5   Armodafinil 250 MG tablet Take 1 tablet (250 mg  total) by mouth daily. 30 tablet 1   oseltamivir (TAMIFLU) 75 MG capsule Take 1 capsule (75 mg total) by mouth 2 (two) times daily. 10 capsule 0   tiZANidine (ZANAFLEX) 2 MG tablet Take 1 tablet (2 mg total) by mouth every 8 (eight) hours as needed for muscle spasms. 90 tablet 1   No facility-administered medications prior to visit.     Per HPI unless specifically indicated in ROS section below Review of Systems  Constitutional:  Positive for fatigue. Negative for chills and fever.  HENT:  Positive  for congestion, postnasal drip and sore throat. Negative for ear discharge, ear pain, sinus pressure and sinus pain.   Respiratory:  Positive for cough (yellow) and shortness of breath (with talking).   Cardiovascular:  Negative for chest pain.  Gastrointestinal:  Positive for diarrhea. Negative for nausea and vomiting.  Musculoskeletal:  Negative for arthralgias and myalgias.  Neurological:  Negative for headaches.  Objective:  There were no vitals taken for this visit.  Wt Readings from Last 3 Encounters:  02/01/21 (!) 363 lb (164.7 kg)  01/04/21 (!) 364 lb 6.4 oz (165.3 kg)  12/29/20 (!) 363 lb (164.7 kg)       Physical exam: Gen: alert, NAD, not ill appearing Pulm: speaks in complete sentences without increased work of breathing Psych: normal mood, normal thought content      Results for orders placed or performed in visit on 01/04/21  POCT glycosylated hemoglobin (Hb A1C)  Result Value Ref Range   Hemoglobin A1C 9.3 (A) 4.0 - 5.6 %   HbA1c POC (<> result, manual entry)     HbA1c, POC (prediabetic range)     HbA1c, POC (controlled diabetic range)     Assessment & Plan:   Problem List Items Addressed This Visit       Other   COVID-19 - Primary    Patient diagnosed with COVID.  Has been vaccinated without any boosters.  Given patient's body habitus and comorbidities will elect to treat him for him for having complications or severe disease.  Did discuss treatment options with patient in regards to both medications or EUA did discuss common side effects about both medications after joint discussion patient decided to use paxlovid.  Did have discussion about drug drug interactions with his rosuvastatin.  Patient will hold rosuvastatin while taking pack Slo-Bid told him he can continue the rosuvastatin the following day.  Patient acknowledged.  Continue to monitor.  Signs and symptoms reviewed as when to seek urgent or emergent health care.      Relevant Medications    nirmatrelvir/ritonavir EUA (PAXLOVID) 20 x 150 MG & 10 x 100MG TABS     No orders of the defined types were placed in this encounter.  No orders of the defined types were placed in this encounter.   I discussed the assessment and treatment plan with the patient. The patient was provided an opportunity to ask questions and all were answered. The patient agreed with the plan and demonstrated an understanding of the instructions. The patient was advised to call back or seek an in-person evaluation if the symptoms worsen or if the condition fails to improve as anticipated.  Follow up plan: No follow-ups on file.  Romilda Garret, NP

## 2021-06-06 ENCOUNTER — Other Ambulatory Visit: Payer: Self-pay

## 2021-06-06 ENCOUNTER — Other Ambulatory Visit (HOSPITAL_COMMUNITY): Payer: Self-pay

## 2021-06-06 ENCOUNTER — Telehealth: Payer: Self-pay | Admitting: Endocrinology

## 2021-06-06 DIAGNOSIS — R809 Proteinuria, unspecified: Secondary | ICD-10-CM

## 2021-06-06 DIAGNOSIS — E669 Obesity, unspecified: Secondary | ICD-10-CM

## 2021-06-06 DIAGNOSIS — E1169 Type 2 diabetes mellitus with other specified complication: Secondary | ICD-10-CM

## 2021-06-06 MED ORDER — DEXCOM G6 SENSOR MISC
5 refills | Status: DC
  Filled 2021-06-06 – 2021-06-21 (×2): qty 3, 30d supply, fill #0
  Filled 2021-08-08: qty 3, 30d supply, fill #1
  Filled 2021-09-22: qty 3, 30d supply, fill #2
  Filled 2021-11-08: qty 3, 30d supply, fill #3

## 2021-06-06 MED ORDER — DEXCOM G6 TRANSMITTER MISC
5 refills | Status: DC
  Filled 2021-06-06 – 2021-06-21 (×2): qty 1, 90d supply, fill #0
  Filled 2021-10-06: qty 1, 90d supply, fill #1
  Filled 2022-03-06: qty 1, 90d supply, fill #2

## 2021-06-06 NOTE — Telephone Encounter (Signed)
Rx sent 

## 2021-06-06 NOTE — Telephone Encounter (Signed)
MEDICATION: Continuous Blood Gluc Sensor (DEXCOM G6 SENSOR) MISC AND Continuous Blood Gluc Transmit (DEXCOM G6 TRANSMITTER) MISC  PHARMACY:   [facility] Phone:  [phone]  Fax:  [phone]      HAS THE PATIENT CONTACTED THEIR PHARMACY?  No  IS THIS A 90 DAY SUPPLY : Transmitter-Yes  IS PATIENT OUT OF MEDICATION: Yes  IF NOT; HOW MUCH IS LEFT: 0  LAST APPOINTMENT DATE: @7 /11/2020  NEXT APPOINTMENT DATE:@1 /25/2023  DO WE HAVE YOUR PERMISSION TO LEAVE A DETAILED MESSAGE?: Yes  OTHER COMMENTS:    **Let patient know to contact pharmacy at the end of the day to make sure medication is ready. **  ** Please notify patient to allow 48-72 hours to process**  **Encourage patient to contact the pharmacy for refills or they can request refills through [facility]**

## 2021-06-07 ENCOUNTER — Ambulatory Visit: Payer: BC Managed Care – PPO | Admitting: Endocrinology

## 2021-06-08 NOTE — Telephone Encounter (Signed)
Received a notification from the DME company= [employer] that the patient has not been set up.  "Patient Stephen Hall DOB 04/18/2020 came in for setup on 10/18 patient went through most of the appointment but decided not to take machine that day because he wanted to think about it, Our office has reached out numerous times via phone to see what the patient has decided and we have not gotten a response, we also mailed a letter. The order will now be voided and the patient can reach out to [place] in the future if he decides to move forward with CPAP therapy."

## 2021-06-13 ENCOUNTER — Ambulatory Visit: Payer: BC Managed Care – PPO | Admitting: Neurology

## 2021-06-16 ENCOUNTER — Other Ambulatory Visit (HOSPITAL_COMMUNITY): Payer: Self-pay

## 2021-06-21 ENCOUNTER — Other Ambulatory Visit: Payer: Self-pay | Admitting: Internal Medicine

## 2021-06-21 ENCOUNTER — Other Ambulatory Visit (HOSPITAL_COMMUNITY): Payer: Self-pay

## 2021-06-21 ENCOUNTER — Other Ambulatory Visit: Payer: Self-pay | Admitting: Endocrinology

## 2021-06-21 DIAGNOSIS — E1169 Type 2 diabetes mellitus with other specified complication: Secondary | ICD-10-CM

## 2021-06-21 DIAGNOSIS — R809 Proteinuria, unspecified: Secondary | ICD-10-CM

## 2021-06-21 MED ORDER — TRULICITY 3 MG/0.5ML ~~LOC~~ SOAJ
3.0000 mg | SUBCUTANEOUS | 0 refills | Status: DC
  Filled 2021-06-21: qty 2, 28d supply, fill #0

## 2021-06-21 MED ORDER — XIGDUO XR 5-1000 MG PO TB24
2.0000 | ORAL_TABLET | Freq: Every day | ORAL | 3 refills | Status: DC
  Filled 2021-06-21 (×2): qty 180, 90d supply, fill #0
  Filled 2022-04-23: qty 180, 90d supply, fill #1

## 2021-07-19 ENCOUNTER — Other Ambulatory Visit: Payer: Self-pay | Admitting: Endocrinology

## 2021-07-19 ENCOUNTER — Other Ambulatory Visit: Payer: Self-pay | Admitting: Internal Medicine

## 2021-07-19 ENCOUNTER — Other Ambulatory Visit (HOSPITAL_COMMUNITY): Payer: Self-pay

## 2021-07-19 DIAGNOSIS — E1169 Type 2 diabetes mellitus with other specified complication: Secondary | ICD-10-CM

## 2021-07-19 DIAGNOSIS — E1129 Type 2 diabetes mellitus with other diabetic kidney complication: Secondary | ICD-10-CM

## 2021-07-19 DIAGNOSIS — E785 Hyperlipidemia, unspecified: Secondary | ICD-10-CM

## 2021-07-19 MED ORDER — TRULICITY 3 MG/0.5ML ~~LOC~~ SOAJ
3.0000 mg | SUBCUTANEOUS | 0 refills | Status: DC
  Filled 2021-07-19: qty 2, 28d supply, fill #0

## 2021-07-19 MED ORDER — ROSUVASTATIN CALCIUM 10 MG PO TABS
ORAL_TABLET | Freq: Every day | ORAL | 1 refills | Status: DC
  Filled 2021-07-19 – 2021-12-18 (×3): qty 90, 90d supply, fill #0
  Filled 2022-04-23: qty 90, 90d supply, fill #1

## 2021-07-20 ENCOUNTER — Other Ambulatory Visit (HOSPITAL_COMMUNITY): Payer: Self-pay

## 2021-07-20 ENCOUNTER — Other Ambulatory Visit: Payer: Self-pay | Admitting: Endocrinology

## 2021-07-20 DIAGNOSIS — E1169 Type 2 diabetes mellitus with other specified complication: Secondary | ICD-10-CM

## 2021-07-26 ENCOUNTER — Other Ambulatory Visit (HOSPITAL_COMMUNITY): Payer: Self-pay

## 2021-07-26 ENCOUNTER — Other Ambulatory Visit: Payer: Self-pay

## 2021-07-26 ENCOUNTER — Ambulatory Visit: Payer: BC Managed Care – PPO | Admitting: Endocrinology

## 2021-07-26 ENCOUNTER — Encounter: Payer: Self-pay | Admitting: Endocrinology

## 2021-07-26 VITALS — BP 140/96 | HR 97 | Ht 77.0 in | Wt 349.6 lb

## 2021-07-26 DIAGNOSIS — E1129 Type 2 diabetes mellitus with other diabetic kidney complication: Secondary | ICD-10-CM | POA: Diagnosis not present

## 2021-07-26 DIAGNOSIS — R809 Proteinuria, unspecified: Secondary | ICD-10-CM | POA: Diagnosis not present

## 2021-07-26 LAB — POCT GLYCOSYLATED HEMOGLOBIN (HGB A1C): Hemoglobin A1C: 11.1 % — AB (ref 4.0–5.6)

## 2021-07-26 MED ORDER — TRESIBA FLEXTOUCH 200 UNIT/ML ~~LOC~~ SOPN
240.0000 [IU] | PEN_INJECTOR | Freq: Every day | SUBCUTANEOUS | 3 refills | Status: DC
  Filled 2021-07-26: qty 84, 70d supply, fill #0

## 2021-07-26 NOTE — Progress Notes (Signed)
Subjective:    Patient ID: Stephen Hall, male    DOB: 04-Sep-1980, 41 y.o.   MRN: [id number]  HPI Pt returns for f/u of diabetes mellitus: DM type: Insulin-requiring type 2 Dx'ed: 0768 Complications: none Therapy: insulin since 2017, and 2 oral meds.   DKA: never Severe hypoglycemia: never.   Pancreatitis: never.   SDOH: Pt says he sometimes misses DM meds, although he has a $0 copay.   Other: he declines multiple daily injections; he works as Futures trader; he is pursuing weight loss surgery; he did not tolerate Soliqua (abd bloating); he uses dexcom CGM.   Interval history: pt says he sometimes misses meds, including the [place].  pt states he feels well in general. I reviewed continuous glucose monitor data.  Glucose varies from 135-400.  It is in general highest at 10AM and 10PM.  It again is lowest at 6AM, after it decreases overnight.  Past Medical History:  Diagnosis Date   Asthma    Diabetes mellitus without complication ([place])    Environmental allergies     No past surgical history on file.  Social History   Socioeconomic History   Marital status: Married    Spouse name: Not on file   Number of children: Not on file   Years of education: Not on file   Highest education level: Not on file  Occupational History   Not on file  Tobacco Use   Smoking status: Never   Smokeless tobacco: Never  Substance and Sexual Activity   Alcohol use: No   Drug use: No   Sexual activity: Yes  Other Topics Concern   Not on file  Social History Narrative   Caffienated drinks-yes   Seat belt use often-yes   Regular Exercise-no   Smoke alarm in the home-yes   Firearms/guns in the home-no   History of physical abuse-no               Social Determinants of Health   Financial Resource Strain: Not on file  Food Insecurity: Not on file  Transportation Needs: Not on file  Physical Activity: Not on file  Stress: Not on file  Social Connections: Not on file   Intimate Partner Violence: Not on file    Current Outpatient Medications on File Prior to Visit  Medication Sig Dispense Refill   Blood Glucose Monitoring Suppl ([place]) w/Device KIT 1 Act by Does not apply route 3 (three) times daily. 2 kit 0   Candesartan Cilexetil-HCTZ 32-25 MG TABS TAKE 1 TABLET BY MOUTH ONCE DAILY 90 tablet 0   Cholecalciferol 50 MCG (2000 UT) TABS Take 1 tablet (2,000 Units total) by mouth daily. 90 tablet 3   Continuous Blood Gluc Receiver (DEXCOM G6 RECEIVER) DEVI 1 Act by Does not apply route daily. 1 each 5   Continuous Blood Gluc Sensor (DEXCOM G6 SENSOR) MISC Use as directed 3 each 5   Continuous Blood Gluc Transmit (DEXCOM G6 TRANSMITTER) MISC Use as directed 1 each 5   Dapagliflozin-metFORMIN HCl ER (XIGDUO XR) 10-998 MG TB24 Take 2 tablets by mouth daily. 180 tablet 3   Dulaglutide (TRULICITY) 3 GS/8.1JS SOPN Inject 3 mg once a week as directed. 2 mL 0   Glucagon (GVOKE HYPOPEN 2-PACK) 1 MG/0.2ML SOAJ Inject 1 pen into the skin daily as needed. 2 mL 5   Insulin Pen Needle 32G X 6 MM MISC Use as directed morning, noon, in the evening and at bedtime. 300 each 1  Lancets (ONETOUCH DELICA PLUS [id number]) MISC USE TO CHECK BLOOD GLUCOSE THREE TIMES DAILY 100 each 1   rosuvastatin (CRESTOR) 10 MG tablet TAKE 1 TABLET BY MOUTH ONCE DAILY 90 tablet 1   No current facility-administered medications on file prior to visit.    No Known Allergies  Family History  Problem Relation Age of Onset   Alcohol abuse Other    Arthritis Other    Hypertension Other    Diabetes Other    Obesity Mother    Hypertension Mother    Obesity Father    Hypertension Father    Cancer Neg Hx    Heart disease Neg Hx    Stroke Neg Hx    Kidney disease Neg Hx     BP (!) 140/96    Pulse 97    Ht '6\' 5"'  (1.956 m)    Wt (!) 349 lb 9.6 oz (158.6 kg)    SpO2 97%    BMI 41.46 kg/m    Review of Systems Denies N/V/HB    Objective:   Physical Exam    Lab  Results  Component Value Date   HGBA1C 11.1 (A) 07/26/2021      Assessment & Plan:  Insulin-requiring type 2 DM: uncontrolled.  Usually due to noncompliance.  We discussed.  He declines to increase Trulicity.    Patient Instructions  Your blood pressure is high today.  Please see your primary care provider soon, to have it rechecked I have sent a prescription to your pharmacy, to increase the Tresiba to 240 units per day. Please continue the same Xigduo and Trulicity.   check your blood sugar twice a day.  vary the time of day when you check, between before the 3 meals, and at bedtime.  also check if you have symptoms of your blood sugar being too high or too low.  please keep a record of the readings and bring it to your next appointment here (or you can bring the meter itself).  You can write it on any piece of paper.  please call us sooner if your blood sugar goes below 70, or if you have a lot of readings over 200.   Please come back for a follow-up appointment in 2 months.

## 2021-07-26 NOTE — Patient Instructions (Addendum)
Your blood pressure is high today.  Please see your primary care provider soon, to have it rechecked I have sent a prescription to your pharmacy, to increase the Tresiba to 240 units per day. Please continue the same Xigduo and Trulicity.   check your blood sugar twice a day.  vary the time of day when you check, between before the 3 meals, and at bedtime.  also check if you have symptoms of your blood sugar being too high or too low.  please keep a record of the readings and bring it to your next appointment here (or you can bring the meter itself).  You can write it on any piece of paper.  please call us sooner if your blood sugar goes below 70, or if you have a lot of readings over 200.   Please come back for a follow-up appointment in 2 months.

## 2021-07-27 ENCOUNTER — Other Ambulatory Visit (HOSPITAL_COMMUNITY): Payer: Self-pay

## 2021-08-08 ENCOUNTER — Other Ambulatory Visit: Payer: Self-pay | Admitting: Internal Medicine

## 2021-08-08 ENCOUNTER — Other Ambulatory Visit (HOSPITAL_COMMUNITY): Payer: Self-pay

## 2021-08-08 DIAGNOSIS — R809 Proteinuria, unspecified: Secondary | ICD-10-CM

## 2021-08-08 DIAGNOSIS — E1129 Type 2 diabetes mellitus with other diabetic kidney complication: Secondary | ICD-10-CM

## 2021-08-08 MED ORDER — TRULICITY 3 MG/0.5ML ~~LOC~~ SOAJ
3.0000 mg | SUBCUTANEOUS | 0 refills | Status: DC
  Filled 2021-08-08 – 2021-08-10 (×2): qty 2, 28d supply, fill #0

## 2021-08-10 ENCOUNTER — Other Ambulatory Visit (HOSPITAL_COMMUNITY): Payer: Self-pay

## 2021-08-17 ENCOUNTER — Other Ambulatory Visit: Payer: Self-pay

## 2021-08-17 ENCOUNTER — Other Ambulatory Visit (HOSPITAL_COMMUNITY): Payer: Self-pay

## 2021-08-17 ENCOUNTER — Ambulatory Visit: Payer: BC Managed Care – PPO | Admitting: Nurse Practitioner

## 2021-08-17 ENCOUNTER — Encounter: Payer: Self-pay | Admitting: Nurse Practitioner

## 2021-08-17 VITALS — BP 118/82 | HR 106 | Resp 18 | Ht 77.0 in | Wt 352.4 lb

## 2021-08-17 DIAGNOSIS — L03011 Cellulitis of right finger: Secondary | ICD-10-CM | POA: Diagnosis not present

## 2021-08-17 MED ORDER — DOXYCYCLINE HYCLATE 100 MG PO TABS
100.0000 mg | ORAL_TABLET | Freq: Two times a day (BID) | ORAL | 0 refills | Status: DC
  Filled 2021-08-17: qty 14, 7d supply, fill #0

## 2021-08-17 MED ORDER — METRONIDAZOLE 500 MG PO TABS
500.0000 mg | ORAL_TABLET | Freq: Three times a day (TID) | ORAL | 0 refills | Status: AC
  Filled 2021-08-17: qty 21, 7d supply, fill #0

## 2021-08-17 MED ORDER — MUPIROCIN 2 % EX OINT
1.0000 | TOPICAL_OINTMENT | Freq: Two times a day (BID) | CUTANEOUS | 0 refills | Status: DC
Start: 2021-08-17 — End: 2022-07-23
  Filled 2021-08-17: qty 22, 11d supply, fill #0

## 2021-08-17 NOTE — Patient Instructions (Addendum)
Metronidazole Capsules or Tablets What is this medication? METRONIDAZOLE (me troe NI da zole) treats infections caused by bacteria or parasites. It belongs to a group of medications called antibiotics. It will not treat colds, the flu, or infections caused by viruses. This medicine may be used for other purposes; ask your health care provider or pharmacist if you have questions. COMMON BRAND NAME(S): Flagyl What should I tell my care team before I take this medication? They need to know if you have any of these conditions: [place] syndrome History of blood diseases such as sickle cell anemia, anemia, or leukemia If you often drink alcohol Irregular heartbeat or rhythm Kidney disease Liver disease Yeast or fungal infection An unusual or allergic reaction to metronidazole, nitroimidazoles, or other medications, foods, dyes, or preservatives Pregnant or trying to get pregnant Breast-feeding How should I use this medication? Take this medication by mouth with water. Take it as directed on the prescription label at the same time every day. Take all of this medication unless your care team tells you to stop it early. Keep taking it even if you think you are better. Talk to your care team about the use of this medication in children. While it may be prescribed for children for selected conditions, precautions do apply. Overdosage: If you think you have taken too much of this medicine contact a poison control center or emergency room at once. NOTE: This medicine is only for you. Do not share this medicine with others. What if I miss a dose? If you miss a dose, take it as soon as you can. If it is almost time for your next dose, take only that dose. Do not take double or extra doses. What may interact with this medication? Do not take this medication with any of the following: Alcohol or any product that contains alcohol Cisapride Disulfiram Dronedarone Pimozide Thioridazine This medication  may also interact with the following: Birth control pills Busulfan Carbamazepine Certain medications that treat or prevent blood clots like warfarin Cimetidine Lithium Other medications that prolong the QT interval (cause an abnormal heart rhythm) Phenobarbital Phenytoin This list may not describe all possible interactions. Give your health care provider a list of all the medicines, herbs, non-prescription drugs, or dietary supplements you use. Also tell them if you smoke, drink alcohol, or use illegal drugs. Some items may interact with your medicine. What should I watch for while using this medication? Tell your care team if your symptoms do not start to get better or if they get worse. Some products may contain alcohol. Ask your care team if this medication contains alcohol. Be sure to tell all care teams you are taking this medication. Certain medications, such as metronidazole and disulfiram, can cause an unpleasant reaction when taken with alcohol. The reaction includes flushing, headache, nausea, vomiting, sweating, and increased thirst. The reaction can last from 30 minutes to several hours. If you are being treated for a sexually transmitted disease (STD), avoid sexual contact until you have finished your treatment. Your sexual partner may also need treatment. Birth control may not work properly while you are taking this medication. Talk to your care team about using an extra method of birth control. What side effects may I notice from receiving this medication? Side effects that you should report to your care team as soon as possible: Allergic reactions--skin rash, itching, hives, swelling of the face, lips, tongue, or throat Dizziness, loss of balance or coordination, confusion or trouble speaking Fever, neck pain or  stiffness, sensitivity to light, headache, nausea, vomiting, confusion Heart rhythm changes--fast or irregular heartbeat, dizziness, feeling faint or lightheaded, chest  pain, trouble breathing Liver injury--right upper belly pain, loss of appetite, nausea, light-colored stool, dark yellow or brown urine, yellowing skin or eyes, unusual weakness or fatigue Pain, tingling, or numbness in the hands or feet Redness, blistering, peeling, or loosening of the skin, including inside the mouth Seizures Severe diarrhea, fever Sudden eye pain or change in vision such as blurry vision, seeing halos around lights, vision loss Unusual vaginal discharge, itching, or odor Side effects that usually do not require medical attention (report to your care team if they continue or are bothersome): Diarrhea Metallic taste in mouth Nausea Stomach pain This list may not describe all possible side effects. Call your doctor for medical advice about side effects. You may report side effects to FDA at 1-800-FDA-1088. Where should I keep my medication? Keep out of the reach of children and pets. Store between 15 and 25 degrees C (59 and 77 degrees F). Protect from light. Get rid of any unused medication after the expiration date. To get rid of medications that are no longer needed or have expired: Take the medication to a medication take-back program. Check with your pharmacy or law enforcement to find a location. If you cannot return the medication, check the label or package insert to see if the medication should be thrown out in the garbage or flushed down the toilet. If you are not sure, ask your care team. If it is safe to put it in the trash, take the medication out of the container. Mix the medication with cat litter, dirt, coffee grounds, or other unwanted substance. Seal the mixture in a bag or container. Put it in the trash. NOTE: This sheet is a summary. It may not cover all possible information. If you have questions about this medicine, talk to your doctor, pharmacist, or health care provider.  2022 Elsevier/Gold Standard (2021-03-07 00:00:00) Doxycycline Capsules or  Tablets What is this medication? DOXYCYCLINE (dox i SYE kleen) treats infections caused by bacteria. It belongs to a group of medications called tetracycline antibiotics. It will not treat colds, the flu, or infections caused by viruses. This medicine may be used for other purposes; ask your health care provider or pharmacist if you have questions. COMMON BRAND NAME(S): Acticlate, Adoxa, Adoxa CK, Adoxa Pak, Adoxa TT, Alodox, Avidoxy, Doxal, LYMEPAK, Mondoxyne NL, Monodox, Morgidox 1x, Morgidox 1x Kit, Morgidox 2x, Morgidox 2x Kit, NutriDox, Ocudox, [place], [place], [place], Vibra-Tabs, Vibramycin What should I tell my care team before I take this medication? They need to know if you have any of these conditions: Kidney disease Liver disease Long exposure to sunlight like working outdoors Recent stomach surgery Stomach or intestine problems such as colitis Vision Problems Yeast or fungal infection of the mouth or vagina An unusual or allergic reaction to doxycycline, tetracycline antibiotics, other medications, foods, dyes, or preservatives Pregnant or trying to get pregnant Breast-feeding How should I use this medication? Take this medication by mouth with water. Take it as directed on the prescription label at the same time every day. It is best to take this medication without food, but if it upsets your stomach take it with food. Take all of this medication unless your care team tells you to stop it early. Keep taking it even if you think you are better. Take antacids and products with aluminum, calcium, magnesium, iron, and zinc in them at a different time of  day than this medication. Talk to your care team if you have questions. Talk to your care team regarding the use of this medication in children. While this medication may be prescribed for selected conditions, precautions do apply. Overdosage: If you think you have taken too much of this medicine contact a poison control center or  emergency room at once. NOTE: This medicine is only for you. Do not share this medicine with others. What if I miss a dose? If you miss a dose, take it as soon as you can. If it is almost time for your next dose, take only that dose. Do not take double or extra doses. What may interact with this medication? Antacids, vitamins, or other products that contain aluminum, calcium, iron, magnesium, or zinc Barbiturates Birth control pills Bismuth subsalicylate Carbamazepine Methoxyflurane Oral retinoids such as acitretin, isotretinoin Other antibiotics Phenytoin Warfarin This list may not describe all possible interactions. Give your health care provider a list of all the medicines, herbs, non-prescription drugs, or dietary supplements you use. Also tell them if you smoke, drink alcohol, or use illegal drugs. Some items may interact with your medicine. What should I watch for while using this medication? Tell your care team if your symptoms do not improve. Do not treat diarrhea with over the counter products. Contact your care team if you have diarrhea that lasts more than 2 days or if it is severe and watery. Do not take this medication just before going to bed. It may not dissolve properly when you lay down and can cause pain in your throat. Drink plenty of fluids while taking this medication to also help reduce irritation in your throat. This medication can make you more sensitive to the sun. Keep out of the sun. If you cannot avoid being in the sun, wear protective clothing and use sunscreen. Do not use sun lamps or tanning beds/booths. Birth control pills may not work properly while you are taking this medication. Talk to your care team about using an extra method of birth control. If you are being treated for a sexually transmitted infection, avoid sexual contact until you have finished your treatment. Your sexual partner may also need treatment. If you are using this medication to prevent  malaria, you should still protect yourself from contact with mosquitos. Stay in screened-in areas, use mosquito nets, keep your body covered, and use an insect repellent. What side effects may I notice from receiving this medication? Side effects that you should report to your care team as soon as possible: Allergic reactions--skin rash, itching, hives, swelling of the face, lips, tongue, or throat Increased pressure around the brain--severe headache, change in vision, blurry vision, nausea, vomiting Joint pain Pain or trouble swallowing Redness, blistering, peeling, or loosening of the skin, including inside the mouth Severe diarrhea, fever Unusual vaginal discharge, itching, or odor Side effects that usually do not require medical attention (report these to your care team if they continue or are bothersome): Change in tooth color Diarrhea Headache Heartburn Nausea This list may not describe all possible side effects. Call your doctor for medical advice about side effects. You may report side effects to FDA at 1-800-FDA-1088. Where should I keep my medication? Keep out of the reach of children and pets. Store at room temperature, below 30 degrees C (86 degrees F). Protect from light. Keep container tightly closed. Throw away any unused medication after the expiration date. Taking this medication after the expiration date can make you seriously ill. NOTE: This  sheet is a summary. It may not cover all possible information. If you have questions about this medicine, talk to your doctor, pharmacist, or health care provider.  2022 Elsevier/Gold Standard (2020-09-03 00:00:00)

## 2021-08-17 NOTE — Progress Notes (Signed)
Subjective:  Patient ID: Stephen Hall, male    DOB: 14-Apr-1981  Age: 41 y.o. MRN: [id number]  CC:  Chief Complaint  Patient presents with   Left Pinky Finger Concerns    Concerned that he has a infection in his hands. Not as painful as his previous infection. Started on Tuesday. 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       HPI  This patient arrives today for the above.  He tells me he has had some swelling, redness, and tenderness to the tip of the fifth digit of his right hand that started about 2 days ago.  He has a history of infection to the fingertip in the past that required incision and drainage and antibiotic therapy.  He tells me he wants to come in early so he could prevent this.    Past Medical History:  Diagnosis Date   Asthma    Diabetes mellitus without complication ([place])    Environmental allergies       Family History  Problem Relation Age of Onset   Alcohol abuse Other    Arthritis Other    Hypertension Other    Diabetes Other    Obesity Mother    Hypertension Mother    Obesity Father    Hypertension Father    Cancer Neg Hx    Heart disease Neg Hx    Stroke Neg Hx    Kidney disease Neg Hx     Social History   Social History Narrative   Caffienated drinks-yes   Seat belt use often-yes   Regular Exercise-no   Smoke alarm in the home-yes   Firearms/guns in the home-no   History of physical abuse-no               Social History   Tobacco Use   Smoking status: Never   Smokeless tobacco: Never  Substance Use Topics   Alcohol use: No     Current Meds  Medication Sig   Blood Glucose Monitoring Suppl (ONETOUCH VERIO FLEX SYSTEM) w/Device KIT 1 Act by Does not apply route 3 (three) times daily.   Candesartan Cilexetil-HCTZ 32-25 MG TABS TAKE 1 TABLET BY MOUTH ONCE DAILY   Cholecalciferol 50 MCG (2000 UT) TABS Take 1 tablet (2,000 Units total) by mouth daily.   Continuous Blood Gluc  Receiver (DEXCOM G6 RECEIVER) DEVI 1 Act by Does not apply route daily.   Continuous Blood Gluc Sensor (DEXCOM G6 SENSOR) MISC Use as directed   Continuous Blood Gluc Transmit (DEXCOM G6 TRANSMITTER) MISC Use as directed   Dapagliflozin-metFORMIN HCl ER (XIGDUO XR) 10-998 MG TB24 Take 2 tablets by mouth daily.   doxycycline (VIBRA-TABS) 100 MG tablet Take 1 tablet by mouth 2 times daily.   Dulaglutide (TRULICITY) 3 AV/6.9VX SOPN Inject 1 pen (3 mg)  once a week as directed.   Glucagon (GVOKE HYPOPEN 2-PACK) 1 MG/0.2ML SOAJ Inject 1 pen into the skin daily as needed.   insulin degludec (TRESIBA FLEXTOUCH) 200 UNIT/ML FlexTouch Pen Inject 240 Units into the skin daily.   Insulin Pen Needle 32G X 6 MM MISC Use as directed morning, noon, in the evening and at bedtime.   Lancets (ONETOUCH DELICA PLUS [id number]) MISC USE TO CHECK BLOOD GLUCOSE THREE TIMES DAILY   metroNIDAZOLE (FLAGYL) 500 MG tablet Take 1 tablet by mouth 3 times daily for 7 days.   mupirocin ointment (BACTROBAN) 2 % Apply topically 2 times  daily.   rosuvastatin (CRESTOR) 10 MG tablet TAKE 1 TABLET BY MOUTH ONCE DAILY    ROS:  See HPI   Objective:   Today's Vitals: BP 118/82    Pulse (!) 106    Resp 18    Ht _0  (1.956 m)    Wt (!) 352 lb 6.4 oz (159.8 kg)    SpO2 98%    BMI 41.79 kg/m  Vitals with BMI 08/17/2021 07/26/2021 02/01/2021  Height _1  _2  _3   Weight 352 lbs 6 oz 349 lbs 10 oz 363 lbs  BMI 41.78 92.11 94.17  Systolic 408 144 818  Diastolic 82 96 86  Pulse 563 97 95     Physical Exam Vitals reviewed.  Constitutional:      Appearance: Normal appearance.  HENT:     Head: Normocephalic and atraumatic.  Cardiovascular:     Rate and Rhythm: Normal rate and regular rhythm.  Pulmonary:     Effort: Pulmonary effort is normal.     Breath sounds: Normal breath sounds.  Musculoskeletal:     Right hand: Swelling (fifth digit) and tenderness (fith digit) present.       Hands:     Cervical back: Neck supple.   Skin:    General: Skin is warm and dry.  Neurological:     Mental Status: He is alert and oriented to person, place, and time.  Psychiatric:        Mood and Affect: Mood normal.        Behavior: Behavior normal.        Thought Content: Thought content normal.        Judgment: Judgment normal.         Assessment and Plan   1. Paronychia of finger of right hand      Plan: I do not see obvious abscess at this time, however there is some swelling and tenderness.  We will treat with topical (mupirocin) and oral antibiotics (Doxy plus metronidazole -discussed possible side effects).  Patient was told to call the office if symptoms progress especially if he starts to experience worsening swelling, redness, tenderness.  He was told if symptoms progress and worsen over the weekend he should proceed to the emergency department.  Patient reports his understanding.   Tests ordered No orders of the defined types were placed in this encounter.     Meds ordered this encounter  Medications   metroNIDAZOLE (FLAGYL) 500 MG tablet    Sig: Take 1 tablet by mouth 3 times daily for 7 days.    Dispense:  21 tablet    Refill:  0    Order Specific Question:   Supervising Provider    Answer:   BURNS, Claudina Lick [[id number]]   doxycycline (VIBRA-TABS) 100 MG tablet    Sig: Take 1 tablet by mouth 2 times daily.    Dispense:  14 tablet    Refill:  0    Order Specific Question:   Supervising Provider    Answer:   BURNS, Claudina Lick [[id number]]   mupirocin ointment (BACTROBAN) 2 %    Sig: Apply topically 2 times daily.    Dispense:  22 g    Refill:  0    Order Specific Question:   Supervising Provider    Answer:   Binnie Rail [id number]    Patient to follow-up if symptoms worsen or do not improve.  Ailene Ards, NP

## 2021-08-23 ENCOUNTER — Telehealth: Payer: Self-pay

## 2021-08-23 NOTE — Telephone Encounter (Signed)
Called patient to get more information on the condition of his hand. Patient states it has not gotten better, however, it has not worsened. I notified the patient per Sarah's OV note on 08/17/21 that she recommends going to the ED if symptoms progress or worsen. Patient states he prefers not to go to the ED and would like to see if there is another medication that she can prescribe. I informed the patient Maralyn Sago is not in the office today but patient states he is okay to wait to hear back what she recommends.

## 2021-08-23 NOTE — Telephone Encounter (Signed)
Pt is calling to report that his hand is still swollen and infected. Pt has completed doxycycline (VIBRA-TABS) 100 MG tabletdoxycycline (VIBRA-TABS) 100 MG tablet and metroNIDAZOLE (FLAGYL) 500 MG tablet as prescribed. Pt states that Jiles Prows, NP told him if it didn't get better.  Please update on what to do next [phone]

## 2021-08-24 ENCOUNTER — Other Ambulatory Visit: Payer: Self-pay | Admitting: Internal Medicine

## 2021-08-24 ENCOUNTER — Other Ambulatory Visit: Payer: Self-pay | Admitting: Nurse Practitioner

## 2021-08-24 ENCOUNTER — Encounter: Payer: Self-pay | Admitting: *Deleted

## 2021-08-24 ENCOUNTER — Other Ambulatory Visit (HOSPITAL_COMMUNITY): Payer: Self-pay

## 2021-08-24 DIAGNOSIS — L03011 Cellulitis of right finger: Secondary | ICD-10-CM

## 2021-08-24 DIAGNOSIS — I10 Essential (primary) hypertension: Secondary | ICD-10-CM

## 2021-08-24 DIAGNOSIS — E1169 Type 2 diabetes mellitus with other specified complication: Secondary | ICD-10-CM

## 2021-08-24 MED ORDER — CANDESARTAN CILEXETIL-HCTZ 32-25 MG PO TABS
1.0000 | ORAL_TABLET | Freq: Every day | ORAL | 0 refills | Status: DC
  Filled 2021-08-24: qty 90, 90d supply, fill #0

## 2021-08-24 MED ORDER — SULFAMETHOXAZOLE-TRIMETHOPRIM 800-160 MG PO TABS
1.0000 | ORAL_TABLET | Freq: Two times a day (BID) | ORAL | 0 refills | Status: DC
  Filled 2021-08-24: qty 20, 10d supply, fill #0

## 2021-08-24 NOTE — Telephone Encounter (Signed)
Called patient and was unable to leave voice message in reference to his referral to [facility]. Will send information to patient mychart.

## 2021-08-24 NOTE — Telephone Encounter (Signed)
Patient called back and informed him of the antibiotic that was sent to his pharmacy. Patient states he was seen at [facility] for the problem in the past and would like a referral back to them  Please advise.

## 2021-08-24 NOTE — Telephone Encounter (Signed)
ATC patient, unable to leave VM, voicemail box was full. Will try again later today.

## 2021-08-28 ENCOUNTER — Other Ambulatory Visit (HOSPITAL_COMMUNITY): Payer: Self-pay

## 2021-08-31 ENCOUNTER — Other Ambulatory Visit (HOSPITAL_COMMUNITY): Payer: Self-pay

## 2021-09-06 ENCOUNTER — Ambulatory Visit: Payer: BC Managed Care – PPO | Admitting: Neurology

## 2021-09-20 ENCOUNTER — Other Ambulatory Visit: Payer: Self-pay

## 2021-09-20 ENCOUNTER — Other Ambulatory Visit (HOSPITAL_COMMUNITY): Payer: Self-pay

## 2021-09-20 ENCOUNTER — Ambulatory Visit: Payer: BC Managed Care – PPO | Admitting: Endocrinology

## 2021-09-20 VITALS — BP 118/76 | HR 105 | Ht 77.0 in | Wt 351.8 lb

## 2021-09-20 DIAGNOSIS — E1129 Type 2 diabetes mellitus with other diabetic kidney complication: Secondary | ICD-10-CM

## 2021-09-20 DIAGNOSIS — R809 Proteinuria, unspecified: Secondary | ICD-10-CM

## 2021-09-20 LAB — POCT GLYCOSYLATED HEMOGLOBIN (HGB A1C): Hemoglobin A1C: 10.9 % — AB (ref 4.0–5.6)

## 2021-09-20 MED ORDER — TRULICITY 3 MG/0.5ML ~~LOC~~ SOAJ
3.0000 mg | SUBCUTANEOUS | 0 refills | Status: DC
  Filled 2021-09-20: qty 2, 28d supply, fill #0

## 2021-09-20 MED ORDER — TRESIBA FLEXTOUCH 200 UNIT/ML ~~LOC~~ SOPN
270.0000 [IU] | PEN_INJECTOR | Freq: Every day | SUBCUTANEOUS | 3 refills | Status: DC
  Filled 2021-09-20: qty 123, 90d supply, fill #0
  Filled 2021-12-18: qty 39, 28d supply, fill #0
  Filled 2022-02-23: qty 39, 28d supply, fill #1
  Filled 2022-05-26: qty 39, 28d supply, fill #2
  Filled 2022-08-28: qty 39, 28d supply, fill #3

## 2021-09-20 NOTE — Progress Notes (Signed)
? ?Subjective:  ? ? Patient ID: Stephen Hall, male    DOB: 02/08/1981, 41 y.o.   MRN: [id number] ? ?HPI ?Pt returns for f/u of diabetes mellitus: ?DM type: Insulin-requiring type 2 ?Dx'ed: 2015 ?Complications: none ?Therapy: insulin since 2017, and 2 oral meds.   ?DKA: never ?Severe hypoglycemia: never.   ?Pancreatitis: never.   ?SDOH: Pt says he sometimes misses DM meds, although he has a $0 copay.   ?Other: he declines multiple daily injections; he works as Futures trader; he is pursuing weight loss surgery; he did not tolerate Soliqua (abd bloating); he uses dexcom CGM.   ?Interval history:  pt states he feels well in general, and takes meds as rx'ed. I reviewed continuous glucose monitor data.  Glucose varies from 120-400.  It is in general highest at 12MN and less so at 10PM.  It is lowest at 6AM, after it decreases overnight.  It is less low at 5PM. ?Past Medical History:  ?Diagnosis Date  ? Asthma   ? Diabetes mellitus without complication ([place])   ? Environmental allergies   ? ? ?No past surgical history on file. ? ?Social History  ? ?Socioeconomic History  ? Marital status: Married  ?  Spouse name: Not on file  ? Number of children: Not on file  ? Years of education: Not on file  ? Highest education level: Not on file  ?Occupational History  ? Not on file  ?Tobacco Use  ? Smoking status: Never  ? Smokeless tobacco: Never  ?Substance and Sexual Activity  ? Alcohol use: No  ? Drug use: No  ? Sexual activity: Yes  ?Other Topics Concern  ? Not on file  ?Social History Narrative  ? Caffienated drinks-yes  ? Seat belt use often-yes  ? Regular Exercise-no  ? Smoke alarm in the home-yes  ? Firearms/guns in the home-no  ? History of physical abuse-no  ?   ?   ?   ?   ? ?Social Determinants of Health  ? ?Financial Resource Strain: Not on file  ?Food Insecurity: Not on file  ?Transportation Needs: Not on file  ?Physical Activity: Not on file  ?Stress: Not on file  ?Social Connections: Not on file  ?Intimate  Partner Violence: Not on file  ? ? ?Current Outpatient Medications on File Prior to Visit  ?Medication Sig Dispense Refill  ? Blood Glucose Monitoring Suppl (ONETOUCH VERIO FLEX SYSTEM) w/Device KIT 1 Act by Does not apply route 3 (three) times daily. 2 kit 0  ? Candesartan Cilexetil-HCTZ 32-25 MG TABS TAKE 1 TABLET BY MOUTH ONCE DAILY 90 tablet 0  ? Cholecalciferol 50 MCG (2000 UT) TABS Take 1 tablet (2,000 Units total) by mouth daily. 90 tablet 3  ? Continuous Blood Gluc Receiver (DEXCOM G6 RECEIVER) DEVI 1 Act by Does not apply route daily. 1 each 5  ? Continuous Blood Gluc Sensor (DEXCOM G6 SENSOR) MISC Use as directed 3 each 5  ? Continuous Blood Gluc Transmit (DEXCOM G6 TRANSMITTER) MISC Use as directed 1 each 5  ? Dapagliflozin-metFORMIN HCl ER (XIGDUO XR) 10-998 MG TB24 Take 2 tablets by mouth daily. 180 tablet 3  ? Glucagon (GVOKE HYPOPEN 2-PACK) 1 MG/0.2ML SOAJ Inject 1 pen into the skin daily as needed. 2 mL 5  ? Insulin Pen Needle 32G X 6 MM MISC Use as directed morning, noon, in the evening and at bedtime. 300 each 1  ? Lancets (ONETOUCH DELICA PLUS [id number]) MISC USE TO CHECK BLOOD GLUCOSE THREE TIMES DAILY  100 each 1  ? mupirocin ointment (BACTROBAN) 2 % Apply topically 2 times daily. 22 g 0  ? rosuvastatin (CRESTOR) 10 MG tablet TAKE 1 TABLET BY MOUTH ONCE DAILY 90 tablet 1  ? sulfamethoxazole-trimethoprim (BACTRIM DS) 800-160 MG tablet Take 1 tablet by mouth 2 times daily. 20 tablet 0  ? ?No current facility-administered medications on file prior to visit.  ? ? ?No Known Allergies ? ?Family History  ?Problem Relation Age of Onset  ? Alcohol abuse Other   ? Arthritis Other   ? Hypertension Other   ? Diabetes Other   ? Obesity Mother   ? Hypertension Mother   ? Obesity Father   ? Hypertension Father   ? Cancer Neg Hx   ? Heart disease Neg Hx   ? Stroke Neg Hx   ? Kidney disease Neg Hx   ? ? ?BP 118/76 (BP Location: Left Arm, Patient Position: Sitting, Cuff Size: Normal)   Pulse (!) 105   Ht _0   (1.956 m)   Wt (!) 351 lb 12.8 oz (159.6 kg)   SpO2 98%   BMI 41.72 kg/m?  ? ? ?Review of Systems ? ?   ?Objective:  ? Physical Exam ? ? ? ?Lab Results  ?Component Value Date  ? HGBA1C 10.9 (A) 09/20/2021  ? ? ?   ?Assessment & Plan:  ?Insulin-requiring type 2 DM: uncontrolled.   ? ?Patient Instructions  ?Your blood pressure is high today.  Please see your primary care provider soon, to have it rechecked ?I have sent a prescription to your pharmacy, to increase the Tresiba to 270 units per day. ?Please continue the same Xigduo and Trulicity.   ?check your blood sugar twice a day.  vary the time of day when you check, between before the 3 meals, and at bedtime.  also check if you have symptoms of your blood sugar being too high or too low.  please keep a record of the readings and bring it to your next appointment here (or you can bring the meter itself).  You can write it on any piece of paper.  please call us sooner if your blood sugar goes below 70, or if you have a lot of readings over 200.   ?Please come back for a follow-up appointment in 3 months.   ? ? ? ?

## 2021-09-20 NOTE — Patient Instructions (Addendum)
Your blood pressure is high today.  Please see your primary care provider soon, to have it rechecked ?I have sent a prescription to your pharmacy, to increase the Tresiba to 270 units per day. ?Please continue the same Xigduo and Trulicity.   ?check your blood sugar twice a day.  vary the time of day when you check, between before the 3 meals, and at bedtime.  also check if you have symptoms of your blood sugar being too high or too low.  please keep a record of the readings and bring it to your next appointment here (or you can bring the meter itself).  You can write it on any piece of paper.  please call us sooner if your blood sugar goes below 70, or if you have a lot of readings over 200.   ?Please come back for a follow-up appointment in 3 months.   ? ?

## 2021-09-22 ENCOUNTER — Other Ambulatory Visit (HOSPITAL_COMMUNITY): Payer: Self-pay

## 2021-10-06 ENCOUNTER — Other Ambulatory Visit (HOSPITAL_COMMUNITY): Payer: Self-pay

## 2021-11-06 ENCOUNTER — Other Ambulatory Visit: Payer: Self-pay | Admitting: Endocrinology

## 2021-11-06 ENCOUNTER — Other Ambulatory Visit (HOSPITAL_COMMUNITY): Payer: Self-pay

## 2021-11-06 DIAGNOSIS — E1129 Type 2 diabetes mellitus with other diabetic kidney complication: Secondary | ICD-10-CM

## 2021-11-07 ENCOUNTER — Other Ambulatory Visit (HOSPITAL_COMMUNITY): Payer: Self-pay

## 2021-11-07 MED ORDER — TRULICITY 3 MG/0.5ML ~~LOC~~ SOAJ
3.0000 mg | SUBCUTANEOUS | 1 refills | Status: DC
  Filled 2021-11-07: qty 2, 28d supply, fill #0
  Filled 2021-12-18: qty 2, 28d supply, fill #1

## 2021-11-08 ENCOUNTER — Other Ambulatory Visit (HOSPITAL_COMMUNITY): Payer: Self-pay

## 2021-11-17 LAB — HM DIABETES EYE EXAM

## 2021-12-11 ENCOUNTER — Encounter: Payer: Self-pay | Admitting: Endocrinology

## 2021-12-18 ENCOUNTER — Other Ambulatory Visit (HOSPITAL_COMMUNITY): Payer: Self-pay

## 2021-12-19 ENCOUNTER — Other Ambulatory Visit (HOSPITAL_COMMUNITY): Payer: Self-pay

## 2021-12-26 ENCOUNTER — Ambulatory Visit (INDEPENDENT_AMBULATORY_CARE_PROVIDER_SITE_OTHER): Payer: BC Managed Care – PPO | Admitting: Endocrinology

## 2021-12-26 ENCOUNTER — Encounter: Payer: Self-pay | Admitting: Endocrinology

## 2021-12-26 ENCOUNTER — Other Ambulatory Visit (HOSPITAL_COMMUNITY): Payer: Self-pay

## 2021-12-26 VITALS — BP 142/88 | HR 92 | Ht 77.0 in | Wt 352.6 lb

## 2021-12-26 DIAGNOSIS — R809 Proteinuria, unspecified: Secondary | ICD-10-CM | POA: Diagnosis not present

## 2021-12-26 DIAGNOSIS — Z794 Long term (current) use of insulin: Secondary | ICD-10-CM | POA: Diagnosis not present

## 2021-12-26 DIAGNOSIS — E782 Mixed hyperlipidemia: Secondary | ICD-10-CM | POA: Diagnosis not present

## 2021-12-26 DIAGNOSIS — E1165 Type 2 diabetes mellitus with hyperglycemia: Secondary | ICD-10-CM

## 2021-12-26 DIAGNOSIS — E1129 Type 2 diabetes mellitus with other diabetic kidney complication: Secondary | ICD-10-CM

## 2021-12-26 DIAGNOSIS — I10 Essential (primary) hypertension: Secondary | ICD-10-CM

## 2021-12-26 LAB — COMPREHENSIVE METABOLIC PANEL
ALT: 20 U/L (ref 0–53)
AST: 16 U/L (ref 0–37)
Albumin: 4.2 g/dL (ref 3.5–5.2)
Alkaline Phosphatase: 87 U/L (ref 39–117)
BUN: 11 mg/dL (ref 6–23)
CO2: 26 mEq/L (ref 19–32)
Calcium: 9.7 mg/dL (ref 8.4–10.5)
Chloride: 100 mEq/L (ref 96–112)
Creatinine, Ser: 0.88 mg/dL (ref 0.40–1.50)
GFR: 108.18 mL/min (ref >60.00)
Glucose, Bld: 165 mg/dL — ABNORMAL HIGH (ref 70–99)
Potassium: 4 mEq/L (ref 3.5–5.1)
Sodium: 136 mEq/L (ref 135–145)
Total Bilirubin: 0.4 mg/dL (ref 0.2–1.2)
Total Protein: 7.8 g/dL (ref 6.0–8.3)

## 2021-12-26 LAB — LIPID PANEL
Cholesterol: 214 mg/dL — ABNORMAL HIGH (ref 0–200)
HDL: 34.7 mg/dL — ABNORMAL LOW (ref >39.00)
LDL Cholesterol: 152 mg/dL — ABNORMAL HIGH (ref 0–99)
NonHDL: 179.37
Total CHOL/HDL Ratio: 6
Triglycerides: 135 mg/dL (ref 0.0–149.0)
VLDL: 27 mg/dL (ref 0.0–40.0)

## 2021-12-26 LAB — POCT GLUCOSE (DEVICE FOR HOME USE): POC Glucose: 183 mg/dl — AB (ref 70–99)

## 2021-12-26 LAB — POCT GLYCOSYLATED HEMOGLOBIN (HGB A1C): Hemoglobin A1C: 10.6 % — AB (ref 4.0–5.6)

## 2021-12-26 LAB — MICROALBUMIN / CREATININE URINE RATIO
Creatinine,U: 41.1 mg/dL
Microalb Creat Ratio: 1.7 mg/g (ref 0.0–30.0)
Microalb, Ur: <0.7 mg/dL (ref 0.0–1.9)

## 2021-12-26 MED ORDER — TIRZEPATIDE 5 MG/0.5ML ~~LOC~~ SOAJ: 5.0000 mg | SUBCUTANEOUS | 0 refills | Status: DC

## 2021-12-26 MED ORDER — DEXCOM G7 SENSOR MISC
1.0000 | 3 refills | Status: DC
  Filled 2021-12-26: qty 3, 30d supply, fill #0
  Filled 2022-03-06: qty 3, 30d supply, fill #1
  Filled 2022-04-23: qty 3, 30d supply, fill #2
  Filled 2022-06-04: qty 3, 30d supply, fill #3

## 2021-12-26 MED ORDER — HUMULIN R U-500 KWIKPEN 500 UNIT/ML ~~LOC~~ SOPN
PEN_INJECTOR | SUBCUTANEOUS | 0 refills | Status: DC
  Filled 2021-12-26: qty 6, 12d supply, fill #0

## 2021-12-27 ENCOUNTER — Other Ambulatory Visit (HOSPITAL_COMMUNITY): Payer: Self-pay

## 2021-12-29 ENCOUNTER — Encounter: Payer: Self-pay | Admitting: Endocrinology

## 2022-01-05 ENCOUNTER — Other Ambulatory Visit (HOSPITAL_COMMUNITY): Payer: Self-pay

## 2022-01-05 ENCOUNTER — Encounter: Payer: Self-pay | Admitting: Endocrinology

## 2022-01-05 MED ORDER — HYDROCODONE-ACETAMINOPHEN 7.5-325 MG PO TABS
ORAL_TABLET | ORAL | 0 refills | Status: DC
  Filled 2022-01-05: qty 12, 2d supply, fill #0

## 2022-01-05 MED ORDER — NAPROXEN SODIUM 220 MG PO TABS: ORAL_TABLET | ORAL | 0 refills | Status: DC

## 2022-01-05 MED ORDER — AMOXICILLIN 500 MG PO CAPS
ORAL_CAPSULE | ORAL | 0 refills | Status: DC
  Filled 2022-01-05: qty 30, 10d supply, fill #0

## 2022-01-11 ENCOUNTER — Ambulatory Visit: Payer: BC Managed Care – PPO | Admitting: Internal Medicine

## 2022-01-11 ENCOUNTER — Other Ambulatory Visit (HOSPITAL_COMMUNITY): Payer: Self-pay

## 2022-01-11 ENCOUNTER — Encounter: Payer: Self-pay | Admitting: Internal Medicine

## 2022-01-11 VITALS — BP 126/88 | HR 100 | Temp 98.3°F | Resp 16 | Ht 77.0 in | Wt 347.1 lb

## 2022-01-11 DIAGNOSIS — I10 Essential (primary) hypertension: Secondary | ICD-10-CM

## 2022-01-11 DIAGNOSIS — N4830 Priapism, unspecified: Secondary | ICD-10-CM | POA: Diagnosis not present

## 2022-01-11 DIAGNOSIS — E1129 Type 2 diabetes mellitus with other diabetic kidney complication: Secondary | ICD-10-CM

## 2022-01-11 DIAGNOSIS — E785 Hyperlipidemia, unspecified: Secondary | ICD-10-CM | POA: Diagnosis not present

## 2022-01-11 DIAGNOSIS — R809 Proteinuria, unspecified: Secondary | ICD-10-CM

## 2022-01-11 DIAGNOSIS — E7801 Familial hypercholesterolemia: Secondary | ICD-10-CM

## 2022-01-11 DIAGNOSIS — Z0001 Encounter for general adult medical examination with abnormal findings: Secondary | ICD-10-CM

## 2022-01-11 LAB — CBC WITH DIFFERENTIAL/PLATELET
Basophils Absolute: 0.1 10*3/uL (ref 0.0–0.1)
Basophils Relative: 0.8 % (ref 0.0–3.0)
Eosinophils Absolute: 0.1 10*3/uL (ref 0.0–0.7)
Eosinophils Relative: 1.5 % (ref 0.0–5.0)
HCT: 45.4 % (ref 39.0–52.0)
Hemoglobin: 14.7 g/dL (ref 13.0–17.0)
Lymphocytes Relative: 33.9 % (ref 12.0–46.0)
Lymphs Abs: 2.9 10*3/uL (ref 0.7–4.0)
MCHC: 32.4 g/dL (ref 30.0–36.0)
MCV: 76.6 fl — ABNORMAL LOW (ref 78.0–100.0)
Monocytes Absolute: 0.7 10*3/uL (ref 0.1–1.0)
Monocytes Relative: 8 % (ref 3.0–12.0)
Neutro Abs: 4.7 10*3/uL (ref 1.4–7.7)
Neutrophils Relative %: 55.8 % (ref 43.0–77.0)
Platelets: 304 10*3/uL (ref 150.0–400.0)
RBC: 5.92 Mil/uL — ABNORMAL HIGH (ref 4.22–5.81)
RDW: 15.7 % — ABNORMAL HIGH (ref 11.5–15.5)
WBC: 8.5 10*3/uL (ref 4.0–10.5)

## 2022-01-11 LAB — TSH: TSH: 1.91 u[IU]/mL (ref 0.35–5.50)

## 2022-01-11 MED ORDER — NEXLIZET 180-10 MG PO TABS
1.0000 | ORAL_TABLET | Freq: Every day | ORAL | 1 refills | Status: DC
  Filled 2022-01-11: qty 90, 90d supply, fill #0
  Filled 2022-05-26: qty 90, 90d supply, fill #1

## 2022-01-11 NOTE — Progress Notes (Signed)
Subjective:  Patient ID: Stephen Hall, male    DOB: 07-18-1980  Age: 41 y.o. MRN: [id number]  CC: Annual Exam, Hypertension, Hyperlipidemia, and Diabetes   HPI Stephen Hall presents for a CPX and f/up -  He recently saw his endocrinologist and it has been requested that he get his LDL to less than 100.  He started using CPAP about 10 days ago but continues to complain of fatigue.  He is active and denies chest pain, shortness of breath, or edema.  Over the last 2 months he has developed pain with erections.  He denies dysuria or hematuria.  Outpatient Medications Prior to Visit  Medication Sig Dispense Refill   amoxicillin (AMOXIL) 500 MG capsule Take 1 capsule by mouth 3 times a day until gone. 30 capsule 0   Blood Glucose Monitoring Suppl (ONETOUCH VERIO FLEX SYSTEM) w/Device KIT 1 Act by Does not apply route 3 (three) times daily. 2 kit 0   Candesartan Cilexetil-HCTZ 32-25 MG TABS TAKE 1 TABLET BY MOUTH ONCE DAILY 90 tablet 0   Cholecalciferol 50 MCG (2000 UT) TABS Take 1 tablet (2,000 Units total) by mouth daily. 90 tablet 3   Continuous Blood Gluc Receiver (DEXCOM G6 RECEIVER) DEVI 1 Act by Does not apply route daily. 1 each 5   Continuous Blood Gluc Transmit (DEXCOM G6 TRANSMITTER) MISC Use as directed 1 each 5   Dapagliflozin-metFORMIN HCl ER (XIGDUO XR) 10-998 MG TB24 Take 2 tablets by mouth daily. 180 tablet 3   Dulaglutide (TRULICITY) 3 PV/9.4IA SOPN Inject 1 pen (3 mg)  once a week as directed. 2 mL 1   Glucagon (GVOKE HYPOPEN 2-PACK) 1 MG/0.2ML SOAJ Inject 1 pen into the skin daily as needed. 2 mL 5   HYDROcodone-acetaminophen (NORCO) 7.5-325 MG tablet Take 1 tablet by mouth every 4-6 hours as needed for pain. 12 tablet 0   insulin degludec (TRESIBA FLEXTOUCH) 200 UNIT/ML FlexTouch Pen Inject 270 Units into the skin daily. 180 mL 3   Insulin Pen Needle 32G X 6 MM MISC Use as directed morning, noon, in the evening and at bedtime. 300 each 1   insulin regular human  CONCENTRATED (HUMULIN R U-500 KWIKPEN) 500 UNIT/ML KwikPen Inject 80 Units under the skin 30-40 minutes before each meal 6 mL 0   Lancets (ONETOUCH DELICA PLUS [id number]) MISC USE TO CHECK BLOOD GLUCOSE THREE TIMES DAILY 100 each 1   naproxen sodium (ALEVE) 220 MG tablet Take 2 tablets by mouth for the first dose, then take 1 tablet by mouth every 8-12 hours. 24 tablet 0   rosuvastatin (CRESTOR) 10 MG tablet TAKE 1 TABLET BY MOUTH ONCE DAILY 90 tablet 1   Continuous Blood Gluc Sensor (DEXCOM G7 SENSOR) MISC Change sensor every 10 days (Patient not taking: Reported on 01/11/2022) 3 each 3   mupirocin ointment (BACTROBAN) 2 % Apply topically 2 times daily. (Patient not taking: Reported on 01/11/2022) 22 g 0   sulfamethoxazole-trimethoprim (BACTRIM DS) 800-160 MG tablet Take 1 tablet by mouth 2 times daily. (Patient not taking: Reported on 01/11/2022) 20 tablet 0   tirzepatide (MOUNJARO) 5 MG/0.5ML Pen Inject 5 mg into the skin once a week. (Patient not taking: Reported on 01/11/2022) 2 mL 0   No facility-administered medications prior to visit.    ROS Review of Systems  Constitutional:  Positive for fatigue. Negative for diaphoresis.  HENT: Negative.    Eyes: Negative.   Respiratory:  Positive for apnea. Negative for cough, shortness of breath and wheezing.  Cardiovascular:  Negative for chest pain, palpitations and leg swelling.  Gastrointestinal:  Negative for abdominal pain, diarrhea, nausea and vomiting.  Genitourinary: Negative.  Negative for difficulty urinating.  Musculoskeletal: Negative.   Skin: Negative.   Neurological:  Negative for dizziness, weakness, light-headedness and headaches.  Hematological:  Negative for adenopathy. Does not bruise/bleed easily.  Psychiatric/Behavioral: Negative.      Objective:  BP 126/88 (BP Location: Left Arm, Patient Position: Sitting, Cuff Size: Large)   Pulse 100   Temp 98.3 F (36.8 C) (Oral)   Resp 16   Ht _0  (1.956 m)   Wt (!) 347 lb 2  oz (157.5 kg)   SpO2 97%   BMI 41.16 kg/m   BP Readings from Last 3 Encounters:  01/11/22 126/88  12/26/21 (!) 142/88  09/20/21 118/76    Wt Readings from Last 3 Encounters:  01/11/22 (!) 347 lb 2 oz (157.5 kg)  12/26/21 (!) 352 lb 9.6 oz (159.9 kg)  09/20/21 (!) 351 lb 12.8 oz (159.6 kg)    Physical Exam Vitals reviewed.  Constitutional:      Appearance: Normal appearance.  HENT:     Nose: Nose normal.     Mouth/Throat:     Mouth: Mucous membranes are moist.  Eyes:     General: No scleral icterus.    Conjunctiva/sclera: Conjunctivae normal.  Cardiovascular:     Rate and Rhythm: Normal rate and regular rhythm.     Heart sounds: No murmur heard. Pulmonary:     Effort: Pulmonary effort is normal.     Breath sounds: No stridor. No wheezing, rhonchi or rales.  Abdominal:     General: Abdomen is flat.     Palpations: There is no mass.     Tenderness: There is no abdominal tenderness. There is no guarding or rebound.     Hernia: No hernia is present. There is no hernia in the left inguinal area or right inguinal area.  Genitourinary:    Pubic Area: No rash.      Penis: Normal and circumcised.      Testes: Normal.     Epididymis:     Right: Normal.     Left: Normal.  Musculoskeletal:        General: No swelling or deformity. Normal range of motion.     Cervical back: Neck supple.     Right lower leg: No edema.     Left lower leg: No edema.  Lymphadenopathy:     Cervical: No cervical adenopathy.     Lower Body: No right inguinal adenopathy. No left inguinal adenopathy.  Skin:    General: Skin is warm and dry.  Neurological:     General: No focal deficit present.     Mental Status: He is alert and oriented to person, place, and time.  Psychiatric:        Mood and Affect: Mood normal.        Behavior: Behavior normal.     Lab Results  Component Value Date   WBC 8.5 01/11/2022   HGB 14.7 01/11/2022   HCT 45.4 01/11/2022   PLT 304.0 01/11/2022   GLUCOSE 165  (H) 12/26/2021   CHOL 214 (H) 12/26/2021   TRIG 135.0 12/26/2021   HDL 34.70 (L) 12/26/2021   LDLDIRECT 158.1 02/07/2012   LDLCALC 152 (H) 12/26/2021   ALT 20 12/26/2021   AST 16 12/26/2021   NA 136 12/26/2021   K 4.0 12/26/2021   CL 100 12/26/2021   CREATININE  0.88 12/26/2021   BUN 11 12/26/2021   CO2 26 12/26/2021   TSH 1.91 01/11/2022   HGBA1C 10.6 (A) 12/26/2021   MICROALBUR <0.7 12/26/2021    No results found.  Assessment & Plan:   Stephen Hall was seen today for annual exam, hypertension, hyperlipidemia and diabetes.  Diagnoses and all orders for this visit:  Essential hypertension- His blood pressure is adequately well controlled. -     TSH; Future -     CBC with Differential/Platelet; Future -     CBC with Differential/Platelet -     TSH  Hyperlipidemia with target LDL less than 130- Will add bempedoic acid and ezetimibe to try to achieve an LDL goal of less than 100. -     TSH; Future -     TSH -     Bempedoic Acid-Ezetimibe (NEXLIZET) 180-10 MG TABS; Take 1 tablet by mouth daily.  Type 2 diabetes mellitus with microalbuminuria, without long-term current use of insulin ([place])-  Managed by ENDO. -     [facility] Diabetes Foot Exam  Encounter for general adult medical examination with abnormal findings- Exam completed, labs reviewed, vaccines are up-to-date, no cancer screenings indicated, patient education was given.  Painful erection -     Ambulatory referral to Urology  Heterozygous familial hypercholesterolemia -     Bempedoic Acid-Ezetimibe (NEXLIZET) 180-10 MG TABS; Take 1 tablet by mouth daily.   I am having Janett Billow start on Nexlizet. I am also having him maintain his OneTouch Verio Flex System, OneTouch Delica Plus [id number], Dexcom G6 Receiver, Insulin Pen Needle, Cholecalciferol, Gvoke HypoPen 2-Pack, Dexcom G6 Transmitter, Xigduo XR, rosuvastatin, mupirocin ointment, sulfamethoxazole-trimethoprim, Candesartan Cilexetil-HCTZ, Tresiba FlexTouch, Trulicity,  tirzepatide, HumuLIN R U-500 KwikPen, Dexcom G7 Sensor, HYDROcodone-acetaminophen, amoxicillin, and naproxen sodium.  Meds ordered this encounter  Medications   Bempedoic Acid-Ezetimibe (NEXLIZET) 180-10 MG TABS    Sig: Take 1 tablet by mouth daily.    Dispense:  90 tablet    Refill:  1     Follow-up: Return in about 6 months (around 07/14/2022).  Scarlette Calico, MD

## 2022-01-11 NOTE — Patient Instructions (Signed)
Health Maintenance, Male Adopting a healthy lifestyle and getting preventive care are important in promoting health and wellness. Ask your health care provider about: The right schedule for you to have regular tests and exams. Things you can do on your own to prevent diseases and keep yourself healthy. What should I know about diet, weight, and exercise? Eat a healthy diet  Eat a diet that includes plenty of vegetables, fruits, low-fat dairy products, and lean protein. Do not eat a lot of foods that are high in solid fats, added sugars, or sodium. Maintain a healthy weight Body mass index (BMI) is a measurement that can be used to identify possible weight problems. It estimates body fat based on height and weight. Your health care provider can help determine your BMI and help you achieve or maintain a healthy weight. Get regular exercise Get regular exercise. This is one of the most important things you can do for your health. Most adults should: Exercise for at least 150 minutes each week. The exercise should increase your heart rate and make you sweat (moderate-intensity exercise). Do strengthening exercises at least twice a week. This is in addition to the moderate-intensity exercise. Spend less time sitting. Even light physical activity can be beneficial. Watch cholesterol and blood lipids Have your blood tested for lipids and cholesterol at 40 years of age, then have this test every 5 years. You may need to have your cholesterol levels checked more often if: Your lipid or cholesterol levels are high. You are older than 40 years of age. You are at high risk for heart disease. What should I know about cancer screening? Many types of cancers can be detected early and may often be prevented. Depending on your health history and family history, you may need to have cancer screening at various ages. This may include screening for: Colorectal cancer. Prostate cancer. Skin cancer. Lung  cancer. What should I know about heart disease, diabetes, and high blood pressure? Blood pressure and heart disease High blood pressure causes heart disease and increases the risk of stroke. This is more likely to develop in people who have high blood pressure readings or are overweight. Talk with your health care provider about your target blood pressure readings. Have your blood pressure checked: Every 3-5 years if you are 18-39 years of age. Every year if you are 40 years old or older. If you are between the ages of 65 and 75 and are a current or former smoker, ask your health care provider if you should have a one-time screening for abdominal aortic aneurysm (AAA). Diabetes Have regular diabetes screenings. This checks your fasting blood sugar level. Have the screening done: Once every three years after age 45 if you are at a normal weight and have a low risk for diabetes. More often and at a younger age if you are overweight or have a high risk for diabetes. What should I know about preventing infection? Hepatitis B If you have a higher risk for hepatitis B, you should be screened for this virus. Talk with your health care provider to find out if you are at risk for hepatitis B infection. Hepatitis C Blood testing is recommended for: Everyone born from 1945 through 1965. Anyone with known risk factors for hepatitis C. Sexually transmitted infections (STIs) You should be screened each year for STIs, including gonorrhea and chlamydia, if: You are sexually active and are younger than 40 years of age. You are older than 40 years of age and your   health care provider tells you that you are at risk for this type of infection. Your sexual activity has changed since you were last screened, and you are at increased risk for chlamydia or gonorrhea. Ask your health care provider if you are at risk. Ask your health care provider about whether you are at high risk for HIV. Your health care provider  may recommend a prescription medicine to help prevent HIV infection. If you choose to take medicine to prevent HIV, you should first get tested for HIV. You should then be tested every 3 months for as long as you are taking the medicine. Follow these instructions at home: Alcohol use Do not drink alcohol if your health care provider tells you not to drink. If you drink alcohol: Limit how much you have to 0-2 drinks a day. Know how much alcohol is in your drink. In the [place] one drink equals one 12 oz bottle of beer (355 mL), one 5 oz glass of wine (148 mL), or one 1 oz glass of hard liquor (44 mL). Lifestyle Do not use any products that contain nicotine or tobacco. These products include cigarettes, chewing tobacco, and vaping devices, such as e-cigarettes. If you need help quitting, ask your health care provider. Do not use street drugs. Do not share needles. Ask your health care provider for help if you need support or information about quitting drugs. General instructions Schedule regular health, dental, and eye exams. Stay current with your vaccines. Tell your health care provider if: You often feel depressed. You have ever been abused or do not feel safe at home. Summary Adopting a healthy lifestyle and getting preventive care are important in promoting health and wellness. Follow your health care provider's instructions about healthy diet, exercising, and getting tested or screened for diseases. Follow your health care provider's instructions on monitoring your cholesterol and blood pressure. This information is not intended to replace advice given to you by your health care provider. Make sure you discuss any questions you have with your health care provider. Document Revised: 11/07/2020 Document Reviewed: 11/07/2020 Elsevier Patient Education  2023 [employer]  

## 2022-01-12 ENCOUNTER — Other Ambulatory Visit (HOSPITAL_COMMUNITY): Payer: Self-pay

## 2022-01-18 ENCOUNTER — Other Ambulatory Visit: Payer: Self-pay | Admitting: Endocrinology

## 2022-01-18 ENCOUNTER — Other Ambulatory Visit: Payer: Self-pay | Admitting: Internal Medicine

## 2022-01-18 ENCOUNTER — Other Ambulatory Visit (HOSPITAL_COMMUNITY): Payer: Self-pay

## 2022-01-18 DIAGNOSIS — I10 Essential (primary) hypertension: Secondary | ICD-10-CM

## 2022-01-18 DIAGNOSIS — E1169 Type 2 diabetes mellitus with other specified complication: Secondary | ICD-10-CM

## 2022-01-18 MED ORDER — TIRZEPATIDE 5 MG/0.5ML ~~LOC~~ SOAJ
5.0000 mg | SUBCUTANEOUS | 0 refills | Status: DC
  Filled 2022-01-18: qty 2, 28d supply, fill #0

## 2022-01-18 MED ORDER — CANDESARTAN CILEXETIL-HCTZ 32-25 MG PO TABS
1.0000 | ORAL_TABLET | Freq: Every day | ORAL | 0 refills | Status: DC
  Filled 2022-01-18: qty 90, 90d supply, fill #0

## 2022-01-19 ENCOUNTER — Other Ambulatory Visit (HOSPITAL_COMMUNITY): Payer: Self-pay

## 2022-01-19 ENCOUNTER — Encounter: Payer: Self-pay | Admitting: Endocrinology

## 2022-01-20 ENCOUNTER — Other Ambulatory Visit (HOSPITAL_COMMUNITY): Payer: Self-pay

## 2022-01-23 ENCOUNTER — Telehealth: Payer: Self-pay | Admitting: Neurology

## 2022-01-23 NOTE — Telephone Encounter (Signed)
LVM and mychart msg informing pt of need to reschedule 8/10 appointment - MD out 

## 2022-01-24 ENCOUNTER — Telehealth: Payer: Self-pay

## 2022-01-24 NOTE — Telephone Encounter (Signed)
-----   Message from Karma Ganja, [place] sent at 01/18/2022  9:08 AM EDT ----- Regarding: Tdap Patient needs Tdap once back in stock

## 2022-01-24 NOTE — Telephone Encounter (Signed)
Appointment made for Friday

## 2022-01-25 ENCOUNTER — Other Ambulatory Visit (HOSPITAL_COMMUNITY): Payer: Self-pay

## 2022-01-26 ENCOUNTER — Telehealth: Payer: Self-pay

## 2022-01-26 ENCOUNTER — Ambulatory Visit (INDEPENDENT_AMBULATORY_CARE_PROVIDER_SITE_OTHER): Payer: BC Managed Care – PPO | Admitting: *Deleted

## 2022-01-26 DIAGNOSIS — Z23 Encounter for immunization: Secondary | ICD-10-CM | POA: Diagnosis not present

## 2022-01-26 NOTE — Telephone Encounter (Signed)
Patient called in states that insurance will not cover mounjaro. He has not had it for almost a month. Please advise

## 2022-01-26 NOTE — Progress Notes (Signed)
Patient here for his Tdap vaccine. Given in right deltoid. Patient tolerated well.

## 2022-01-27 ENCOUNTER — Other Ambulatory Visit: Payer: Self-pay | Admitting: Endocrinology

## 2022-01-27 ENCOUNTER — Other Ambulatory Visit (HOSPITAL_COMMUNITY): Payer: Self-pay

## 2022-01-27 MED ORDER — TRULICITY 1.5 MG/0.5ML ~~LOC~~ SOAJ
SUBCUTANEOUS | 0 refills | Status: DC
  Filled 2022-01-27: qty 2, 28d supply, fill #0

## 2022-01-29 NOTE — Telephone Encounter (Signed)
Called patient about the Trulicity. According to patient. Insurance is needing more information on what the mounjaro is for according to patient. He would like to try mounjaro is insurance will pay for it.

## 2022-01-30 ENCOUNTER — Other Ambulatory Visit (HOSPITAL_COMMUNITY): Payer: Self-pay

## 2022-01-31 ENCOUNTER — Other Ambulatory Visit (HOSPITAL_COMMUNITY): Payer: Self-pay

## 2022-01-31 ENCOUNTER — Encounter: Payer: Self-pay | Admitting: Endocrinology

## 2022-02-08 ENCOUNTER — Ambulatory Visit: Payer: BC Managed Care – PPO | Admitting: Neurology

## 2022-02-08 ENCOUNTER — Other Ambulatory Visit (HOSPITAL_COMMUNITY): Payer: Self-pay

## 2022-02-08 MED ORDER — TADALAFIL 5 MG PO TABS
ORAL_TABLET | ORAL | 3 refills | Status: DC
  Filled 2022-02-08: qty 24, 24d supply, fill #0
  Filled 2022-02-09: qty 66, 66d supply, fill #0
  Filled 2022-07-03: qty 90, 90d supply, fill #1
  Filled 2023-01-15: qty 30, 30d supply, fill #2

## 2022-02-08 MED ORDER — CELECOXIB 200 MG PO CAPS
ORAL_CAPSULE | ORAL | 1 refills | Status: DC
  Filled 2022-02-08: qty 30, 15d supply, fill #0

## 2022-02-09 ENCOUNTER — Other Ambulatory Visit (HOSPITAL_COMMUNITY): Payer: Self-pay

## 2022-02-15 ENCOUNTER — Other Ambulatory Visit (HOSPITAL_COMMUNITY): Payer: Self-pay

## 2022-02-23 ENCOUNTER — Other Ambulatory Visit: Payer: Self-pay | Admitting: Endocrinology

## 2022-02-23 ENCOUNTER — Other Ambulatory Visit (HOSPITAL_COMMUNITY): Payer: Self-pay

## 2022-02-23 DIAGNOSIS — E1129 Type 2 diabetes mellitus with other diabetic kidney complication: Secondary | ICD-10-CM

## 2022-02-23 DIAGNOSIS — Z794 Long term (current) use of insulin: Secondary | ICD-10-CM

## 2022-02-23 MED ORDER — TRULICITY 3 MG/0.5ML ~~LOC~~ SOAJ
3.0000 mg | SUBCUTANEOUS | 2 refills | Status: DC
  Filled 2022-02-23: qty 2, 28d supply, fill #0

## 2022-02-24 ENCOUNTER — Other Ambulatory Visit (HOSPITAL_COMMUNITY): Payer: Self-pay

## 2022-02-26 ENCOUNTER — Other Ambulatory Visit (HOSPITAL_COMMUNITY): Payer: Self-pay

## 2022-02-27 ENCOUNTER — Other Ambulatory Visit (HOSPITAL_COMMUNITY): Payer: Self-pay

## 2022-03-06 ENCOUNTER — Other Ambulatory Visit: Payer: Self-pay

## 2022-03-07 ENCOUNTER — Other Ambulatory Visit (INDEPENDENT_AMBULATORY_CARE_PROVIDER_SITE_OTHER): Payer: BC Managed Care – PPO

## 2022-03-07 ENCOUNTER — Other Ambulatory Visit: Payer: Self-pay

## 2022-03-07 DIAGNOSIS — E782 Mixed hyperlipidemia: Secondary | ICD-10-CM

## 2022-03-07 DIAGNOSIS — Z794 Long term (current) use of insulin: Secondary | ICD-10-CM | POA: Diagnosis not present

## 2022-03-07 DIAGNOSIS — E1165 Type 2 diabetes mellitus with hyperglycemia: Secondary | ICD-10-CM | POA: Diagnosis not present

## 2022-03-07 LAB — BASIC METABOLIC PANEL
BUN: 11 mg/dL (ref 6–23)
CO2: 27 mEq/L (ref 19–32)
Calcium: 9.6 mg/dL (ref 8.4–10.5)
Chloride: 101 mEq/L (ref 96–112)
Creatinine, Ser: 0.93 mg/dL (ref 0.40–1.50)
GFR: 103.16 mL/min (ref >60.00)
Glucose, Bld: 179 mg/dL — ABNORMAL HIGH (ref 70–99)
Potassium: 4.1 mEq/L (ref 3.5–5.1)
Sodium: 137 mEq/L (ref 135–145)

## 2022-03-07 LAB — LIPID PANEL
Cholesterol: 111 mg/dL (ref 0–200)
HDL: 31.1 mg/dL — ABNORMAL LOW (ref >39.00)
LDL Cholesterol: 50 mg/dL (ref 0–99)
NonHDL: 79.69
Total CHOL/HDL Ratio: 4
Triglycerides: 147 mg/dL (ref 0.0–149.0)
VLDL: 29.4 mg/dL (ref 0.0–40.0)

## 2022-03-08 ENCOUNTER — Other Ambulatory Visit: Payer: Self-pay

## 2022-03-08 LAB — FRUCTOSAMINE: Fructosamine: 316 umol/L — ABNORMAL HIGH (ref 0–285)

## 2022-03-09 ENCOUNTER — Other Ambulatory Visit (HOSPITAL_COMMUNITY): Payer: Self-pay

## 2022-03-09 ENCOUNTER — Other Ambulatory Visit: Payer: Self-pay

## 2022-03-14 ENCOUNTER — Ambulatory Visit: Payer: BC Managed Care – PPO | Admitting: Endocrinology

## 2022-03-14 ENCOUNTER — Encounter: Payer: Self-pay | Admitting: Endocrinology

## 2022-03-14 VITALS — BP 118/70 | HR 101 | Ht 77.0 in | Wt 347.8 lb

## 2022-03-14 DIAGNOSIS — E7800 Pure hypercholesterolemia, unspecified: Secondary | ICD-10-CM | POA: Diagnosis not present

## 2022-03-14 DIAGNOSIS — E1165 Type 2 diabetes mellitus with hyperglycemia: Secondary | ICD-10-CM

## 2022-03-14 MED ORDER — NOVOLOG FLEXPEN 100 UNIT/ML ~~LOC~~ SOPN
30.0000 [IU] | PEN_INJECTOR | Freq: Three times a day (TID) | SUBCUTANEOUS | 11 refills | Status: DC
  Filled 2022-03-14 – 2022-03-26 (×2): qty 15, 16d supply, fill #0
  Filled 2022-05-26: qty 15, 16d supply, fill #1
  Filled 2022-07-20: qty 15, 16d supply, fill #2
  Filled 2022-08-28: qty 15, 16d supply, fill #3
  Filled 2023-01-15: qty 15, 16d supply, fill #4
  Filled 2023-03-08: qty 15, 16d supply, fill #5

## 2022-03-14 NOTE — Progress Notes (Signed)
Patient ID: Stephen Hall, male   DOB: Nov 17, 1980, 41 y.o.   MRN: [id number]           Reason for Appointment: Type II Diabetes follow-up   History of Present Illness   Dx'ed: 3875 Complications: none Therapy: insulin since 2017, and 2 oral meds.   DKA: never Severe hypoglycemia: never.   Pancreatitis: never.   SDOH: Pt says he sometimes misses DM meds, although he has a $0 copay.   Other: he declines multiple daily injections; he works as Futures trader; he is pursuing weight loss surgery; he did not tolerate Soliqua (abd bloating); he uses dexcom CGM.   Interval history:  pt states he feels well in general, and takes meds as rx'ed. I reviewed continuous glucose monitor data.  Glucose varies from 120-400.  It is in general highest at 12MN and less so at 10PM.  It is lowest at 6AM, after it decreases overnight.  It is less low at 5PM.  Diagnosis date: 2015  Previous history:  Non-insulin hypoglycemic drugs previously used: Prandin,?  Metformin Insulin was started in 2017 Also previously used Soliqua, Lantus insulin, Humalog  A1c range in the last few years is: 7.8-13.6  Recent history:     Non-insulin hypoglycemic drugs: Trulicity 3 mg weekly, Xigduo 10/998, 1 tablet daily     Insulin regimen: 160 units of U-200 Tresiba daily           Side effects from medications: Mild stomach upset from [place]  Current self management, blood sugar patterns and problems identified:  A1c is recently the same at 10.6 and higher than usual this year Fructosamine is 316, higher than before He is still using the Dexcom although recently has only data for about 2 or 3 days  He was told to try the Humulin R U-500 but he has not used it However he still appears to be having about the same level of control or better with even reducing his Tresiba from 270 down to 260 units daily  This morning his blood sugars fasting were about 130 which is better than usual However he has significantly  high postprandial readings after all the meals which go up fairly rapidly This is despite continuing Trulicity and Xigduo As before has not done any exercise Weight is about the same Fasting lab glucose was 179    Interpretation of the G7 Dexcom sensor not possible because of incomplete data but recent average about 202 with time in target only about 38%  Pre this interpretation and data: High variability is present Blood sugar data is only rarely over the last week On average blood sugars are much above the target range at all times HIGHEST blood sugar average is 298 at 10 PM LOWEST average blood sugar at any given time is 216 at 5-6 AM Blood sugar tracing shows rising blood sugars after all the meals although inconsistent in the morning    CGM use % of time   2-week average/GV 256, +/-177  Time in range       18%  % Time Above 180 33  % Time above 250 40   % Time Below 70     Exercise: none   Diet management: Frequently eating fast food breakfast and lunch     Meals: 8.30 am biscuit, lunch 12 noon: drink, dinner with regular soft drink, meat and vegetables  Hypoglycemia:  none recently  Dietician visit: Most recent: 2016     Weight control:  Wt Readings from  Last 3 Encounters:  03/14/22 (!) 347 lb 12.8 oz (157.8 kg)  01/11/22 (!) 347 lb 2 oz (157.5 kg)  12/26/21 (!) 352 lb 9.6 oz (159.9 kg)            Diabetes labs:  Lab Results  Component Value Date   HGBA1C 10.6 (A) 12/26/2021   HGBA1C 10.9 (A) 09/20/2021   HGBA1C 11.1 (A) 07/26/2021   Lab Results  Component Value Date   MICROALBUR <0.7 12/26/2021   LDLCALC 50 03/07/2022   CREATININE 0.93 03/07/2022     Allergies as of 03/14/2022   No Known Allergies      Medication List        Accurate as of March 14, 2022  9:09 PM. If you have any questions, ask your nurse or doctor.          STOP taking these medications    HumuLIN R U-500 KwikPen 500 UNIT/ML KwikPen Generic drug: insulin  regular human CONCENTRATED Stopped by: Elayne Snare, MD       TAKE these medications    amoxicillin 500 MG capsule Commonly known as: AMOXIL Take 1 capsule by mouth 3 times a day until gone.   BD Pen Needle Micro U/F 32G X 6 MM Misc Generic drug: Insulin Pen Needle Use as directed morning, noon, in the evening and at bedtime.   Candesartan Cilexetil-HCTZ 32-25 MG Tabs TAKE 1 TABLET BY MOUTH ONCE DAILY   celecoxib 200 MG capsule Commonly known as: CeleBREX Take 1 capsule by mouth 2 times daily as needed for pain   Cholecalciferol 50 MCG (2000 UT) Tabs Take 1 tablet (2,000 Units total) by mouth daily.   Dexcom G6 Receiver Devi 1 Act by Does not apply route daily.   Dexcom G6 Transmitter Misc Use as directed   Dexcom G7 Sensor Misc Change sensor every 10 days   Gvoke HypoPen 2-Pack 1 MG/0.2ML Soaj Generic drug: Glucagon Inject 1 pen into the skin daily as needed.   HYDROcodone-acetaminophen 7.5-325 MG tablet Commonly known as: NORCO Take 1 tablet by mouth every 4-6 hours as needed for pain.   mupirocin ointment 2 % Commonly known as: BACTROBAN Apply topically 2 times daily.   naproxen sodium 220 MG tablet Commonly known as: ALEVE Take 2 tablets by mouth for the first dose, then take 1 tablet by mouth every 8-12 hours.   Nexlizet 180-10 MG Tabs Generic drug: Bempedoic Acid-Ezetimibe Take 1 tablet by mouth daily.   NovoLOG FlexPen 100 UNIT/ML FlexPen Generic drug: insulin aspart Inject 30 Units into the skin 3 (three) times daily with meals. Started by: Elayne Snare, MD   OneTouch Delica Plus [id number] Misc USE TO CHECK BLOOD GLUCOSE THREE TIMES DAILY   OneTouch Verio Flex System w/Device Kit 1 Act by Does not apply route 3 (three) times daily.   rosuvastatin 10 MG tablet Commonly known as: CRESTOR TAKE 1 TABLET BY MOUTH ONCE DAILY   sulfamethoxazole-trimethoprim 800-160 MG tablet Commonly known as: BACTRIM DS Take 1 tablet by mouth 2 times daily.    tadalafil 5 MG tablet Commonly known as: CIALIS Take 1 tablet by mouth daily.   [name] FlexTouch 200 UNIT/ML FlexTouch Pen Generic drug: insulin degludec Inject 270 Units into the skin daily.   Trulicity 3 HL/4.5GY Sopn Generic drug: Dulaglutide Inject 3 mg into the skin as directed once a week. What changed: Another medication with the same name was removed. Continue taking this medication, and follow the directions you see here. Changed by: Elayne Snare, MD  Xigduo XR 10-998 MG Tb24 Generic drug: Dapagliflozin-metFORMIN HCl ER Take 2 tablets by mouth daily.        Allergies: No Known Allergies  Past Medical History:  Diagnosis Date   Asthma    Diabetes mellitus without complication ([place])    Environmental allergies     No past surgical history on file.  Family History  Problem Relation Age of Onset   Alcohol abuse Other    Arthritis Other    Hypertension Other    Diabetes Other    Obesity Mother    Hypertension Mother    Obesity Father    Hypertension Father    Cancer Neg Hx    Heart disease Neg Hx    Stroke Neg Hx    Kidney disease Neg Hx     Social History:  reports that he has never smoked. He has never used smokeless tobacco. He reports that he does not drink alcohol and does not use drugs.  Review of Systems:  Last diabetic eye exam date 5/23  Last foot exam date: 7/23  Symptoms of neuropathy: None  Hypertension:   Treatment includes candesartan prescribed by PCP  BP Readings from Last 3 Encounters:  03/14/22 118/70  01/11/22 126/88  12/26/21 (!) 142/88    Lipid management: Previously on Crestor 10 mg daily He has now been given Nexlizet by his PCP also with following results Still has low HDL    Lab Results  Component Value Date   CHOL 111 03/07/2022   CHOL 214 (H) 12/26/2021   CHOL 143 10/18/2020   Lab Results  Component Value Date   HDL 31.10 (L) 03/07/2022   HDL 34.70 (L) 12/26/2021   HDL 34.40 (L) 10/18/2020   Lab  Results  Component Value Date   LDLCALC 50 03/07/2022   LDLCALC 152 (H) 12/26/2021   LDLCALC 90 10/18/2020   Lab Results  Component Value Date   TRIG 147.0 03/07/2022   TRIG 135.0 12/26/2021   TRIG 93.0 10/18/2020   Lab Results  Component Value Date   CHOLHDL 4 03/07/2022   CHOLHDL 6 12/26/2021   CHOLHDL 4 10/18/2020   Lab Results  Component Value Date   LDLDIRECT 158.1 02/07/2012     Examination:   BP 118/70   Pulse (!) 101   Ht _0  (1.956 m)   Wt (!) 347 lb 12.8 oz (157.8 kg)   SpO2 98%   BMI 41.24 kg/m   Body mass index is 41.24 kg/m.    ASSESSMENT/ PLAN:    Diabetes type 2:    Current regimen: Trulicity, Farxiga and 967 units basal insulin daily  See history of present illness for detailed discussion of current diabetes management, blood sugar patterns and problems identified  A1c is last 10.6  Blood glucose control is slightly better although fructosamine of 316 and still indicates relatively high reading Recent blood sugar averages about 200 but has incomplete data  He likely has had a somewhat better diet and is requiring less basal insulin  However he did not start the U-500 insulin as directed and has significant postprandial hyperglycemia which may or may not be always related to diet or drinking drinks with sugar  History of hypercholesterolemia: He is now excellent with adding Nexlizet by his PCP  HYPERTENSION: Has improved controlled   RECOMMENDATIONS:  Today discussed in detail the need for mealtime insulin to cover postprandial spikes, action of mealtime insulin, use of the insulin pen, timing and action of the rapid acting  insulin as well as starting dose and dosage titration to target the two-hour reading of under 180  He will start with about 30 units of NovoLog and given him a diagrammatic instruction sheet on how to dose the insulin, blood sugar targets and adjustment of doses He will likely need 5 to 10 units more for any higher  carbohydrate meals or eating However he needs to watch the postprandial readings and keep his blood sugars under 180 Currently since his morning readings appear to be improving will not change his [name]  Also will need to try and get [facility] prior authorized as he is not responding well to Trulicity with significant obesity and postprandial hyperglycemia  Discussed the need to starting regular exercise program of what ever activity he enjoys Consider follow-up with nutritionist or diabetes educator  He will try to use the Dexcom consistently  Patient Instructions  Avoid sugar drinks  Walk daily  Take an average of 30 units of NovoLog, to be injected right before each meal For anything with higher carbohydrate intake or larger meals may need 35 to 40 units The dose is to be adjusted to keep blood sugars from being over 180 after the meal If the blood sugar is over 200 before eating can take additional 5 units to bring it down  Total visit time including counseling = 30 minutes  Elayne Snare 03/14/2022, 9:09 PM

## 2022-03-14 NOTE — Patient Instructions (Addendum)
Avoid sugar drinks  Walk daily  Take an average of 30 units of NovoLog, to be injected right before each meal For anything with higher carbohydrate intake or larger meals may need 35 to 40 units The dose is to be adjusted to keep blood sugars from being over 180 after the meal If the blood sugar is over 200 before eating can take additional 5 units to bring it down

## 2022-03-15 ENCOUNTER — Telehealth: Payer: Self-pay

## 2022-03-15 ENCOUNTER — Other Ambulatory Visit (HOSPITAL_COMMUNITY): Payer: Self-pay

## 2022-03-15 NOTE — Telephone Encounter (Signed)
Patient Advocate Encounter   Received notification from MD to start prior authorization for [facility] 5MG /0.5ML pen-injectors  Submitted: 03-15-2022 Key 03-17-2022

## 2022-03-16 ENCOUNTER — Other Ambulatory Visit (HOSPITAL_COMMUNITY): Payer: Self-pay

## 2022-03-16 ENCOUNTER — Other Ambulatory Visit: Payer: Self-pay

## 2022-03-16 DIAGNOSIS — E1165 Type 2 diabetes mellitus with hyperglycemia: Secondary | ICD-10-CM

## 2022-03-16 MED ORDER — TIRZEPATIDE 5 MG/0.5ML ~~LOC~~ SOAJ
5.0000 mg | SUBCUTANEOUS | 1 refills | Status: DC
  Filled 2022-03-16: qty 6, 84d supply, fill #0
  Filled 2022-03-26: qty 2, 28d supply, fill #0
  Filled 2022-04-23: qty 2, 28d supply, fill #1
  Filled 2022-05-26: qty 2, 28d supply, fill #2
  Filled 2022-07-03: qty 2, 28d supply, fill #3
  Filled 2022-07-20: qty 2, 28d supply, fill #4
  Filled 2022-08-28: qty 2, 28d supply, fill #5

## 2022-03-16 NOTE — Telephone Encounter (Signed)
Patient Advocate Encounter  Prior Authorization for Mounjaro 5MG /0.5ML pen-injectors has been approved.    Effective: 03-15-2022 to 03-15-2025  Co-pay $45.61

## 2022-03-19 ENCOUNTER — Encounter: Payer: Self-pay | Admitting: Dietician

## 2022-03-19 ENCOUNTER — Encounter: Payer: BC Managed Care – PPO | Attending: Endocrinology | Admitting: Dietician

## 2022-03-19 DIAGNOSIS — E1169 Type 2 diabetes mellitus with other specified complication: Secondary | ICD-10-CM | POA: Insufficient documentation

## 2022-03-19 DIAGNOSIS — E669 Obesity, unspecified: Secondary | ICD-10-CM | POA: Insufficient documentation

## 2022-03-19 NOTE — Progress Notes (Signed)
Diabetes Self-Management Education  Visit Type: First/Initial  Appt. Start Time: 1600 Appt. End Time: [id number]  03/19/2022  [name], identified by name and date of birth, is a 41 y.o. male with a diagnosis of Diabetes: Type 2.   ASSESSMENT Patient is here today alone.  He was last seen in this office in 2016.  Patient would like more information to control his blood glucose, exercise, food  Referral diagnosis:  Type 2 Diabetes. History includes:  Type 2 Diabetes, OSA on c-pap, low vitamin D, HLD, HTN Medications include:  Tresiba 160 units q am, Novolog  30 units before each meal, mounjaro (will start when he is off Trulicity), Xigduo XR Sleep:  poor 3 hours at a time and possible another 3 hours "when lucky" CGM:  Dexcom G7  77" 353 lbs 03/19/2022 325 lbs 2020  Patient lives with his wife and 2 sons.  She does the shopping and cooking.  He works as a Freight forwarder in [employer]. Goal is 6500 steps per day per patient. Height 6\' 5"  (1.956 m), weight (!) 353 lb (160.1 kg). Body mass index is 41.86 kg/m.   Diabetes Self-Management Education - 03/19/22 1629       Visit Information   Visit Type First/Initial      Initial Visit   Diabetes Type Type 2    Date Diagnosed 2016    Are you taking your medications as prescribed? Yes      Health Coping   How would you rate your overall health? Fair      Psychosocial Assessment   Patient Belief/Attitude about Diabetes Other (comment)   all of the above   What is the hardest part about your diabetes right now, causing you the most concern, or is the most worrisome to you about your diabetes?   Making healty food and beverage choices;Being active    Self-care barriers None    Self-management support Doctor's office    Other persons present Patient    Patient Concerns Nutrition/Meal planning    Special Needs None    Learning Readiness Ready    How often do you need to have someone help you when you read instructions,  pamphlets, or other written materials from your doctor or pharmacy? 1 - Never    What is the last grade level you completed in school? college grad      Pre-Education Assessment   Patient understands the diabetes disease and treatment process. Needs Review    Patient understands incorporating nutritional management into lifestyle. Needs Review    Patient undertands incorporating physical activity into lifestyle. Needs Review    Patient understands using medications safely. Needs Review    Patient understands monitoring blood glucose, interpreting and using results Needs Review    Patient understands prevention, detection, and treatment of acute complications. Needs Review    Patient understands prevention, detection, and treatment of chronic complications. Needs Review    Patient understands how to develop strategies to address psychosocial issues. Needs Review    Patient understands how to develop strategies to promote health/change behavior. Needs Review      Complications   Last HgB A1C per patient/outside source 10.6 %   12/26/2021 decreased from 10.9% 09/2021   How often do you check your blood sugar? > 4 times/day    Fasting Blood glucose range (mg/dL) 130-179;180-200    Postprandial Blood glucose range (mg/dL) 180-200;>200    Number of hypoglycemic episodes per month 0    Number of hyperglycemic episodes ( >  200mg /dL): Daily    Can you tell when your blood sugar is high? Yes    Have you had a dilated eye exam in the past 12 months? Yes    Have you had a dental exam in the past 12 months? Yes    Are you checking your feet? Yes    How many days per week are you checking your feet? 7      Dietary Intake   Breakfast sausage or egg and cheese biscuit OR fruit OR NABS    Snack (morning) none    Lunch subway 6" sub and cookie    Snack (afternoon) occasional chips   4:30   Dinner meat, 2 vegetables, starch   6 pm   Snack (evening) cookies and chips    Beverage(s) water (>64 oz), OJ or  regular soda or sweet tea (12 oz portion)      Activity / Exercise   Activity / Exercise Type Light (walking / raking leaves)   only on the job     Patient Education   Previous Diabetes Education Yes (please comment)   2016   Disease Pathophysiology Definition of diabetes, type 1 and 2, and the diagnosis of diabetes;Explored patient's options for treatment of their diabetes    Healthy Eating Role of diet in the treatment of diabetes and the relationship between the three main macronutrients and blood glucose level;Meal options for control of blood glucose level and chronic complications.;Information on hints to eating out and maintain blood glucose control.;Meal timing in regards to the patients' current diabetes medication.;Plate Method    Being Active Role of exercise on diabetes management, blood pressure control and cardiac health.    Medications Reviewed patients medication for diabetes, action, purpose, timing of dose and side effects.;Taught/reviewed insulin/injectables, injection, site rotation, insulin/injectables storage and needle disposal.    Monitoring Daily foot exams;Yearly dilated eye exam;Identified appropriate SMBG and/or A1C goals.;Taught/evaluated CGM (comment)   Dexcom reviewed.  Usage 50% of the time as he was using on the same location.  Discussed for him to switch arms and sites and use consistently.  Average 2 week average is 267, Time in range 21%, 38% high, 40% very high, and no lows   Acute complications Taught prevention, symptoms, and  treatment of hypoglycemia - the 15 rule.;Discussed and identified patients' prevention, symptoms, and treatment of hyperglycemia.    Chronic complications Relationship between chronic complications and blood glucose control;Retinopathy and reason for yearly dilated eye exams    Diabetes Stress and Support Identified and addressed patients feelings and concerns about diabetes;Worked with patient to identify barriers to care and solutions;Role  of stress on diabetes      Individualized Goals (developed by patient)   Nutrition General guidelines for healthy choices and portions discussed    Physical Activity Exercise 5-7 days per week;30 minutes per day    Medications take my medication as prescribed    Monitoring  Consistenly use CGM    Problem Solving Eating Pattern;Addressing barriers to behavior change;Sleep Pattern    Reducing Risk examine blood glucose patterns;do foot checks daily;treat hypoglycemia with 15 grams of carbs if blood glucose less than 70mg /dL      Post-Education Assessment   Patient understands the diabetes disease and treatment process. Comprehends key points    Patient understands incorporating nutritional management into lifestyle. Comprehends key points    Patient undertands incorporating physical activity into lifestyle. Comprehends key points    Patient understands using medications safely. Comphrehends key points  Patient understands monitoring blood glucose, interpreting and using results Comprehends key points    Patient understands prevention, detection, and treatment of acute complications. Comprehends key points    Patient understands prevention, detection, and treatment of chronic complications. Comprehends key points    Patient understands how to develop strategies to address psychosocial issues. Comprehends key points    Patient understands how to develop strategies to promote health/change behavior. Comprehends key points      Outcomes   Expected Outcomes Demonstrated interest in learning. Expect positive outcomes    Future DMSE PRN    Program Status Completed             Individualized Plan for Diabetes Self-Management Training:   Learning Objective:  Patient will have a greater understanding of diabetes self-management. Patient education plan is to attend individual and/or group sessions per assessed needs and concerns.   Plan:   Patient Instructions  Take Vitamin D  daily. Overpatches for the Dexcom G7  http://james-garner.info/ or amazon etc.  Use a pill box to help organize your medications and help with consistency. CeQur Simplicity may be helpful to deliver the Novolog if you are struggling to take this.  Jimmie Sport and exercise psychologist could be an alternative to fast food Choose a protein bar with 15 grams of carbs rather than a cookie Consider the Calorie [employer]  Rethink what you drink  Water is your best choice  Spakling water  True lemon or other sugar free flavor packs  Be active daily    Expected Outcomes:  Demonstrated interest in learning. Expect positive outcomes  Education material provided: ADA - How to Thrive: A Guide for Your Journey with Diabetes, My Plate, Snack sheet, and Diabetes Resources  If problems or questions, patient to contact team via:  Phone  Future DSME appointment: PRN

## 2022-03-19 NOTE — Patient Instructions (Addendum)
Take Vitamin D daily. Overpatches for the Dexcom G7  http://james-garner.info/ or amazon etc.  Use a pill box to help organize your medications and help with consistency. CeQur Simplicity may be helpful to deliver the Novolog if you are struggling to take this.  Jimmie Sport and exercise psychologist could be an alternative to fast food Choose a protein bar with 15 grams of carbs rather than a cookie Consider the Calorie [employer]  Rethink what you drink  Water is your best choice  Spakling water  True lemon or other sugar free flavor packs  Be active daily

## 2022-03-26 ENCOUNTER — Other Ambulatory Visit (HOSPITAL_COMMUNITY): Payer: Self-pay

## 2022-04-04 ENCOUNTER — Ambulatory Visit: Payer: BC Managed Care – PPO | Admitting: Neurology

## 2022-04-23 ENCOUNTER — Other Ambulatory Visit (HOSPITAL_COMMUNITY): Payer: Self-pay

## 2022-04-24 ENCOUNTER — Other Ambulatory Visit (HOSPITAL_COMMUNITY): Payer: Self-pay

## 2022-05-26 ENCOUNTER — Other Ambulatory Visit (HOSPITAL_COMMUNITY): Payer: Self-pay

## 2022-05-26 ENCOUNTER — Other Ambulatory Visit: Payer: Self-pay | Admitting: Internal Medicine

## 2022-05-26 DIAGNOSIS — E669 Obesity, unspecified: Secondary | ICD-10-CM

## 2022-05-26 DIAGNOSIS — R809 Proteinuria, unspecified: Secondary | ICD-10-CM

## 2022-05-26 DIAGNOSIS — E1129 Type 2 diabetes mellitus with other diabetic kidney complication: Secondary | ICD-10-CM

## 2022-05-26 DIAGNOSIS — I10 Essential (primary) hypertension: Secondary | ICD-10-CM

## 2022-05-26 DIAGNOSIS — E1169 Type 2 diabetes mellitus with other specified complication: Secondary | ICD-10-CM

## 2022-05-26 MED ORDER — CANDESARTAN CILEXETIL-HCTZ 32-25 MG PO TABS
1.0000 | ORAL_TABLET | Freq: Every day | ORAL | 0 refills | Status: DC
  Filled 2022-05-26: qty 90, 90d supply, fill #0

## 2022-05-26 MED ORDER — BD PEN NEEDLE MICRO U/F 32G X 6 MM MISC
1.0000 | Freq: Four times a day (QID) | 1 refills | Status: DC
  Filled 2022-05-26: qty 300, 90d supply, fill #0
  Filled 2022-05-29: qty 300, 75d supply, fill #0

## 2022-05-28 ENCOUNTER — Other Ambulatory Visit (HOSPITAL_COMMUNITY): Payer: Self-pay

## 2022-05-29 ENCOUNTER — Other Ambulatory Visit (HOSPITAL_COMMUNITY): Payer: Self-pay

## 2022-05-30 ENCOUNTER — Other Ambulatory Visit (HOSPITAL_COMMUNITY): Payer: Self-pay

## 2022-05-31 ENCOUNTER — Other Ambulatory Visit (HOSPITAL_COMMUNITY): Payer: Self-pay

## 2022-06-01 ENCOUNTER — Other Ambulatory Visit (HOSPITAL_COMMUNITY): Payer: Self-pay

## 2022-06-04 ENCOUNTER — Other Ambulatory Visit (HOSPITAL_COMMUNITY): Payer: Self-pay

## 2022-06-05 ENCOUNTER — Other Ambulatory Visit (HOSPITAL_COMMUNITY): Payer: Self-pay

## 2022-07-03 ENCOUNTER — Other Ambulatory Visit (HOSPITAL_COMMUNITY): Payer: Self-pay

## 2022-07-09 ENCOUNTER — Other Ambulatory Visit: Payer: BC Managed Care – PPO

## 2022-07-10 ENCOUNTER — Other Ambulatory Visit (INDEPENDENT_AMBULATORY_CARE_PROVIDER_SITE_OTHER): Payer: BC Managed Care – PPO

## 2022-07-10 DIAGNOSIS — E1165 Type 2 diabetes mellitus with hyperglycemia: Secondary | ICD-10-CM

## 2022-07-10 LAB — BASIC METABOLIC PANEL
BUN: 9 mg/dL (ref 6–23)
CO2: 28 mEq/L (ref 19–32)
Calcium: 9.5 mg/dL (ref 8.4–10.5)
Chloride: 99 mEq/L (ref 96–112)
Creatinine, Ser: 0.96 mg/dL (ref 0.40–1.50)
GFR: 99.06 mL/min (ref >60.00)
Glucose, Bld: 114 mg/dL — ABNORMAL HIGH (ref 70–99)
Potassium: 3.8 mEq/L (ref 3.5–5.1)
Sodium: 139 mEq/L (ref 135–145)

## 2022-07-10 LAB — HEMOGLOBIN A1C: Hgb A1c MFr Bld: 10.7 % — ABNORMAL HIGH (ref 4.6–6.5)

## 2022-07-11 ENCOUNTER — Ambulatory Visit: Payer: BC Managed Care – PPO | Admitting: Internal Medicine

## 2022-07-11 ENCOUNTER — Ambulatory Visit (INDEPENDENT_AMBULATORY_CARE_PROVIDER_SITE_OTHER): Payer: BC Managed Care – PPO | Admitting: Internal Medicine

## 2022-07-11 ENCOUNTER — Encounter: Payer: Self-pay | Admitting: Internal Medicine

## 2022-07-11 VITALS — BP 124/82 | HR 113 | Temp 98.1°F | Resp 16 | Ht 77.0 in | Wt 348.0 lb

## 2022-07-11 DIAGNOSIS — R200 Anesthesia of skin: Secondary | ICD-10-CM | POA: Diagnosis not present

## 2022-07-11 DIAGNOSIS — I10 Essential (primary) hypertension: Secondary | ICD-10-CM | POA: Diagnosis not present

## 2022-07-11 DIAGNOSIS — R202 Paresthesia of skin: Secondary | ICD-10-CM | POA: Diagnosis not present

## 2022-07-11 DIAGNOSIS — E1129 Type 2 diabetes mellitus with other diabetic kidney complication: Secondary | ICD-10-CM

## 2022-07-11 DIAGNOSIS — E5111 Dry beriberi: Secondary | ICD-10-CM

## 2022-07-11 DIAGNOSIS — R000 Tachycardia, unspecified: Secondary | ICD-10-CM | POA: Insufficient documentation

## 2022-07-11 DIAGNOSIS — R809 Proteinuria, unspecified: Secondary | ICD-10-CM

## 2022-07-11 LAB — CBC WITH DIFFERENTIAL/PLATELET
Basophils Absolute: 0.1 10*3/uL (ref 0.0–0.1)
Basophils Relative: 0.6 % (ref 0.0–3.0)
Eosinophils Absolute: 0.1 10*3/uL (ref 0.0–0.7)
Eosinophils Relative: 1.3 % (ref 0.0–5.0)
HCT: 46.2 % (ref 39.0–52.0)
Hemoglobin: 14.9 g/dL (ref 13.0–17.0)
Lymphocytes Relative: 27.7 % (ref 12.0–46.0)
Lymphs Abs: 2.9 10*3/uL (ref 0.7–4.0)
MCHC: 32.4 g/dL (ref 30.0–36.0)
MCV: 76.5 fl — ABNORMAL LOW (ref 78.0–100.0)
Monocytes Absolute: 1.1 10*3/uL — ABNORMAL HIGH (ref 0.1–1.0)
Monocytes Relative: 10.1 % (ref 3.0–12.0)
Neutro Abs: 6.3 10*3/uL (ref 1.4–7.7)
Neutrophils Relative %: 60.3 % (ref 43.0–77.0)
Platelets: 331 10*3/uL (ref 150.0–400.0)
RBC: 6.03 Mil/uL — ABNORMAL HIGH (ref 4.22–5.81)
RDW: 15.8 % — ABNORMAL HIGH (ref 11.5–15.5)
WBC: 10.4 10*3/uL (ref 4.0–10.5)

## 2022-07-11 LAB — VITAMIN B12: Vitamin B-12: 446 pg/mL (ref 211–911)

## 2022-07-11 LAB — FOLATE: Folate: 10.5 ng/mL (ref >5.9)

## 2022-07-11 LAB — TSH: TSH: 1.9 u[IU]/mL (ref 0.35–5.50)

## 2022-07-11 NOTE — Progress Notes (Signed)
Subjective:  Patient ID: Stephen Hall, male    DOB: Mar 23, 1981  Age: 42 y.o. MRN: [id number]  CC: Hypertension and Diabetes   HPI Joeziah Voit presents for f/up -  He complains of a 1 month history of numbness and tingling in both hands.  He is active and denies chest pain, shortness of breath, diaphoresis, palpitations, edema, dizziness, or lightheadedness.  Outpatient Medications Prior to Visit  Medication Sig Dispense Refill   amoxicillin (AMOXIL) 500 MG capsule Take 1 capsule by mouth 3 times a day until gone. (Patient not taking: Reported on 07/12/2022) 30 capsule 0   Bempedoic Acid-Ezetimibe (NEXLIZET) 180-10 MG TABS Take 1 tablet by mouth daily. 90 tablet 1   Blood Glucose Monitoring Suppl (ONETOUCH VERIO FLEX SYSTEM) w/Device KIT 1 Act by Does not apply route 3 (three) times daily. 2 kit 0   Candesartan Cilexetil-HCTZ 32-25 MG TABS TAKE 1 TABLET BY MOUTH ONCE DAILY 90 tablet 0   celecoxib (CELEBREX) 200 MG capsule Take 1 capsule by mouth 2 times daily as needed for pain 30 capsule 1   Cholecalciferol 50 MCG (2000 UT) TABS Take 1 tablet (2,000 Units total) by mouth daily. (Patient not taking: Reported on 07/12/2022) 90 tablet 3   Continuous Blood Gluc Receiver (DEXCOM G6 RECEIVER) DEVI 1 Act by Does not apply route daily. (Patient not taking: Reported on 07/12/2022) 1 each 5   Continuous Blood Gluc Transmit (DEXCOM G6 TRANSMITTER) MISC Use as directed 1 each 5   Dapagliflozin Pro-metFORMIN ER (XIGDUO XR) 10-998 MG TB24 Take 2 tablets by mouth daily. 180 tablet 3   Glucagon (GVOKE HYPOPEN 2-PACK) 1 MG/0.2ML SOAJ Inject 1 pen into the skin daily as needed. 2 mL 5   HYDROcodone-acetaminophen (NORCO) 7.5-325 MG tablet Take 1 tablet by mouth every 4-6 hours as needed for pain. 12 tablet 0   insulin aspart (NOVOLOG FLEXPEN) 100 UNIT/ML FlexPen Inject 30 Units into the skin 3 (three) times daily with meals. 15 mL 11   insulin degludec (TRESIBA FLEXTOUCH) 200 UNIT/ML FlexTouch Pen Inject  270 Units into the skin daily. (Patient taking differently: Inject 180 Units into the skin daily.) 180 mL 3   Insulin Pen Needle (BD PEN NEEDLE MICRO U/F) 32G X 6 MM MISC Use as directed morning, noon, in the evening and at bedtime. 300 each 1   Lancets (ONETOUCH DELICA PLUS LANCET33G) MISC USE TO CHECK BLOOD GLUCOSE THREE TIMES DAILY 100 each 1   mupirocin ointment (BACTROBAN) 2 % Apply topically 2 times daily. 22 g 0   naproxen sodium (ALEVE) 220 MG tablet Take 2 tablets by mouth for the first dose, then take 1 tablet by mouth every 8-12 hours. 24 tablet 0   rosuvastatin (CRESTOR) 10 MG tablet TAKE 1 TABLET BY MOUTH ONCE DAILY 90 tablet 1   sulfamethoxazole-trimethoprim (BACTRIM DS) 800-160 MG tablet Take 1 tablet by mouth 2 times daily. 20 tablet 0   tadalafil (CIALIS) 5 MG tablet Take 1 tablet by mouth daily. 90 tablet 3   tirzepatide (MOUNJARO) 5 MG/0.5ML Pen Inject 5 mg into the skin once a week. 6 mL 1   Continuous Blood Gluc Sensor (DEXCOM G7 SENSOR) MISC Change sensor every 10 days 3 each 3   No facility-administered medications prior to visit.    ROS Review of Systems  Constitutional: Negative.  Negative for diaphoresis and fatigue.  HENT: Negative.    Eyes: Negative.   Respiratory:  Negative for cough and wheezing.   Cardiovascular:  Negative for chest  pain, palpitations and leg swelling.  Gastrointestinal:  Negative for abdominal pain, constipation, diarrhea, nausea and vomiting.  Endocrine: Negative.   Genitourinary: Negative.  Negative for difficulty urinating.  Musculoskeletal: Negative.  Negative for myalgias and neck pain.  Skin: Negative.   Neurological:  Positive for numbness. Negative for weakness and headaches.  Hematological:  Negative for adenopathy. Does not bruise/bleed easily.  Psychiatric/Behavioral: Negative.      Objective:  BP 124/82 (BP Location: Left Arm, Patient Position: Sitting, Cuff Size: Large)   Pulse (!) 113   Temp 98.1 F (36.7 C) (Oral)    Resp 16   Ht 6\' 5"  (1.956 m)   Wt (!) 348 lb (157.9 kg)   SpO2 95%   BMI 41.27 kg/m   BP Readings from Last 3 Encounters:  07/12/22 118/74  07/11/22 124/82  03/14/22 118/70    Wt Readings from Last 3 Encounters:  07/12/22 (!) 350 lb 9.6 oz (159 kg)  07/11/22 (!) 348 lb (157.9 kg)  03/19/22 (!) 353 lb (160.1 kg)    Physical Exam Vitals reviewed.  HENT:     Mouth/Throat:     Mouth: Mucous membranes are moist.  Eyes:     General: No scleral icterus.    Conjunctiva/sclera: Conjunctivae normal.  Cardiovascular:     Rate and Rhythm: Regular rhythm. Tachycardia present.     Heart sounds: Normal heart sounds, S1 normal and S2 normal. No murmur heard.    No friction rub. No gallop.     Comments: EKG- ST, 107 bpm Inverted T waves are old No LVH or Q waves  Pulmonary:     Effort: Pulmonary effort is normal.     Breath sounds: No stridor. No wheezing, rhonchi or rales.  Abdominal:     General: Abdomen is flat.     Palpations: There is no mass.     Tenderness: There is no abdominal tenderness. There is no guarding.     Hernia: No hernia is present.  Musculoskeletal:     Cervical back: Neck supple.     Right lower leg: No edema.     Left lower leg: No edema.  Lymphadenopathy:     Cervical: No cervical adenopathy.  Skin:    General: Skin is warm and dry.  Neurological:     General: No focal deficit present.     Mental Status: He is alert. Mental status is at baseline.  Psychiatric:        Mood and Affect: Mood normal.        Behavior: Behavior normal.     Lab Results  Component Value Date   WBC 10.4 07/11/2022   HGB 14.9 07/11/2022   HCT 46.2 07/11/2022   PLT 331.0 07/11/2022   GLUCOSE 114 (H) 07/10/2022   CHOL 111 03/07/2022   TRIG 147.0 03/07/2022   HDL 31.10 (L) 03/07/2022   LDLDIRECT 158.1 02/07/2012   LDLCALC 50 03/07/2022   ALT 20 12/26/2021   AST 16 12/26/2021   NA 139 07/10/2022   K 3.8 07/10/2022   CL 99 07/10/2022   CREATININE 0.96 07/10/2022    BUN 9 07/10/2022   CO2 28 07/10/2022   TSH 1.90 07/11/2022   HGBA1C 10.7 (H) 07/10/2022   MICROALBUR <0.7 12/26/2021    No results found.  Assessment & Plan:   Corday was seen today for hypertension and diabetes.  Diagnoses and all orders for this visit:  Tachycardia- EKG and labs are reassuring.  He thinks this is caused by a of  exercise. -     EKG 12-Lead -     CBC with Differential/Platelet; Future -     TSH; Future -     TSH -     CBC with Differential/Platelet  Numbness and tingling in both hands- I will treat the thiamine deficiency. -     CBC with Differential/Platelet; Future -     Vitamin B12; Future -     Vitamin B1; Future -     Folate; Future -     TSH; Future -     TSH -     Folate -     Vitamin B1 -     Vitamin B12 -     CBC with Differential/Platelet  Essential hypertension- His blood pressure is adequately well-controlled. -     CBC with Differential/Platelet; Future -     TSH; Future -     TSH -     CBC with Differential/Platelet  Type 2 diabetes mellitus with microalbuminuria, without long-term current use of insulin ([place])- He is trying to get better control of his blood sugar.  Thiamine deficiency neuropathy -     thiamine (VITAMIN B1) 100 MG tablet; Take 1 tablet (100 mg total) by mouth daily.   I am having Janett Billow start on thiamine. I am also having him maintain his OneTouch [employer], OneTouch Delica Plus [id number], Dexcom G6 Receiver, Cholecalciferol, Gvoke HypoPen 2-Pack, Dexcom G6 Transmitter, Xigduo XR, rosuvastatin, mupirocin ointment, sulfamethoxazole-trimethoprim, Tresiba FlexTouch, HYDROcodone-acetaminophen, amoxicillin, naproxen sodium, Nexlizet, celecoxib, tadalafil, NovoLOG FlexPen, tirzepatide, BD Pen Needle Micro U/F, and Candesartan Cilexetil-HCTZ.  Meds ordered this encounter  Medications   thiamine (VITAMIN B1) 100 MG tablet    Sig: Take 1 tablet (100 mg total) by mouth daily.    Dispense:  90 tablet     Refill:  1     Follow-up: Return in about 6 months (around 01/09/2023).  Scarlette Calico, MD

## 2022-07-11 NOTE — Patient Instructions (Signed)
Sinus Tachycardia  Sinus tachycardia is a fast heartbeat. In sinus tachycardia, the heart beats more than 100 times a minute. Sinus tachycardia starts in the part of the heart called the sinoatrial (SA) node. Sinus tachycardia may be harmless, or it may be a sign of a serious condition. What are the causes? This condition may be caused by: Exercise or exertion. A fever. Pain. Loss of body fluids (dehydration). Severe bleeding (hemorrhage). Anxiety and stress. Certain substances, including: Alcohol. Caffeine. Tobacco and nicotine products. Cold medicines. Illegal drugs. Medical conditions including: Heart disease. An infection. An overactive thyroid (hyperthyroidism). A lack of red blood cells (anemia). What are the signs or symptoms? Symptoms of this condition include: A feeling that the heart is beating fast or unevenly (palpitations). Suddenly noticing your heartbeat (cardiac awareness). Lightheadedness. Tiredness (fatigue). Shortness of breath. Chest pain. Nausea. Fainting. How is this diagnosed? This condition is diagnosed with: A physical exam. Tests or monitoring, such as: Blood tests. An electrocardiogram (ECG). This test measures the electrical activity of the heart. Ambulatory cardiac monitor. This records your heartbeats for 24 hours or more. You may be referred to a heart specialist (cardiologist). How is this treated? Treatment for this condition depends on the cause. Treatment may involve: Treating the underlying condition. Taking new medicines or changing your current medicines as told by your health care provider. Making changes to your diet or lifestyle. Follow these instructions at home: Lifestyle  Do not use any products that contain nicotine or tobacco. These products include cigarettes, chewing tobacco, and vaping devices, such as e-cigarettes. If you need help quitting, ask your health care provider. Do not use illegal drugs, such as  cocaine. Learn relaxation methods to help you when you get stressed or anxious. These include deep breathing. Avoid caffeine or other stimulants, including herbal stimulants that are found in energy drinks. Alcohol use  Do not drink alcohol if: Your health care provider tells you not to drink. You are pregnant, may be pregnant, or are planning to become pregnant. If you drink alcohol: Limit how much you have to: 0-1 drink a day for women. 0-2 drinks a day for men. Know how much alcohol is in your drink. In the [place] one drink equals one 12 oz bottle of beer (355 mL), one 5 oz glass of wine (148 mL), or one 1 oz glass of hard liquor (44 mL). General instructions Drink enough fluids to keep your urine pale yellow. Take over-the-counter and prescription medicines only as told by your health care provider. Ask your health care provider about taking vitamins, herbs, and supplements. Contact a health care provider if: You have vomiting or diarrhea that does not go away. You have a fever. You have weakness or dizziness. You feel faint. Get help right away if: You have pain in your chest, upper arms, jaw, or neck. You have palpitations that do not go away. Summary In sinus tachycardia, the heart beats more than 100 times a minute. Sinus tachycardia may be harmless, or it may be a sign of a serious condition. Treatment for this condition depends on the cause or the underlying condition. Get help right away if you have pain in your chest, upper arms, jaw, or neck. This information is not intended to replace advice given to you by your health care provider. Make sure you discuss any questions you have with your health care provider. Document Revised: 10/17/2021 Document Reviewed: 10/17/2021 Elsevier Patient Education  2023 [employer] Inc.  

## 2022-07-12 ENCOUNTER — Ambulatory Visit: Payer: BC Managed Care – PPO | Admitting: Endocrinology

## 2022-07-12 ENCOUNTER — Encounter: Payer: Self-pay | Admitting: Endocrinology

## 2022-07-12 VITALS — BP 118/74 | HR 100 | Ht 77.0 in | Wt 350.6 lb

## 2022-07-12 DIAGNOSIS — E1165 Type 2 diabetes mellitus with hyperglycemia: Secondary | ICD-10-CM

## 2022-07-12 DIAGNOSIS — I10 Essential (primary) hypertension: Secondary | ICD-10-CM | POA: Diagnosis not present

## 2022-07-12 MED ORDER — TIRZEPATIDE 15 MG/0.5ML ~~LOC~~ SOAJ: 15.0000 mg | SUBCUTANEOUS | 3 refills | Status: DC

## 2022-07-12 MED ORDER — TIRZEPATIDE 10 MG/0.5ML ~~LOC~~ SOAJ: 10.0000 mg | SUBCUTANEOUS | 0 refills | Status: DC

## 2022-07-12 NOTE — Patient Instructions (Addendum)
Tresiba 200, Novolog upto 40 units at meals  Mounjaro last 2 shots together then take 10 mg for 4 weeks then 15mg  weekly  Exercise daily

## 2022-07-12 NOTE — Progress Notes (Addendum)
Patient ID: Stephen Hall, male   DOB: 1980/12/31, 42 y.o.   MRN: [id number]           Reason for Appointment: Type II Diabetes follow-up   History of Present Illness    Diagnosis date: 2015  Previous history:  Non-insulin hypoglycemic drugs previously used: Prandin,?  Metformin Insulin was started in 2017 Also previously used [place], Lantus insulin, Humalog, Trulicity  A1c range in the last few years is: 7.8-13.6  Recent history:     Non-insulin hypoglycemic drugs: Mounjaro since 03/2022, Xigduo 10/998, 1 tablet daily     Insulin regimen: 180 units of U-200 Tresiba daily, NovoLog 30 units before meals           Side effects from medications: Mild stomach upset from [employer]  Current self management, blood sugar patterns and problems identified:  A1c is recently the same at 10.6 He appears to be quite inconsistent with his diet with larger portions at times, frequent snacks, likely high carbohydrate or high fat meals causing frequent hyperglycemia This is despite being on Mounjaro 5 mg weekly since 9/23 He did not call for higher dose He is not having any nausea but occasionally having some fullness and burping, previously not responding as well to Trulicity His still having high fasting readings even with 180 units of Tresiba and higher than usual this year Has only modest control of postprandial hyperglycemia with adding NovoLog 30 units at meals However he takes his right before eating and blood sugars appear to rise fairly quickly after meals Not exercising   CGM use % of time 86  2-week average/GV 1 week average 228/35 184/30  Time in range       28%  % Time Above 180 38  % Time above 250 33  % Time Below 70 1     Interpretation of the G7 Dexcom sensor download  Pre this interpretation and data: High variability is present from day-to-day with much higher blood sugars between 12/29 and 1/2 On those days with the high sugars he was having hyperglycemia fairly  consistently throughout the day at night with only occasional improvement Data is not available for 1/4 and partly 1/5 Overnight blood sugars previously high and in the last 5 days mostly at the upper end of the target range of 180 with some hyperglycemia after midnight No hypoglycemia seen although on 1/6 blood sugars appear to be relatively low possibly some artifact HYPERGLYCEMIC spikes are seen frequently at all different times either morning hours, midday or occasionally evenings  HIGHEST blood sugars overall are midmorning and late at night  Average blood sugar at any given time of the day ranges between about 196 early morning and 253 around noon time   CGM use % of time   2-week average/GV 256, +/-177  Time in range       18%  % Time Above 180 33  % Time above 250 40   % Time Below 70     Exercise: none   Diet management: Frequently eating fast food breakfast and lunch     Meals: 8.30 am biscuit, lunch 12 noon: drink, dinner with regular soft drink, meat and vegetables  Dietician visit: Most recent: 2016     Weight control:  Wt Readings from Last 3 Encounters:  07/12/22 (!) 350 lb 9.6 oz (159 kg)  07/11/22 (!) 348 lb (157.9 kg)  03/19/22 (!) 353 lb (160.1 kg)            Diabetes  labs:  Lab Results  Component Value Date   HGBA1C 10.7 (H) 07/10/2022   HGBA1C 10.6 (A) 12/26/2021   HGBA1C 10.9 (A) 09/20/2021   Lab Results  Component Value Date   MICROALBUR <0.7 12/26/2021   LDLCALC 50 03/07/2022   CREATININE 0.96 07/10/2022     Allergies as of 07/12/2022   No Known Allergies      Medication List        Accurate as of July 12, 2022  4:48 PM. If you have any questions, ask your nurse or doctor.          amoxicillin 500 MG capsule Commonly known as: AMOXIL Take 1 capsule by mouth 3 times a day until gone.   BD Pen Needle Micro U/F 32G X 6 MM Misc Generic drug: Insulin Pen Needle Use as directed morning, noon, in the evening and at bedtime.    Candesartan Cilexetil-HCTZ 32-25 MG Tabs TAKE 1 TABLET BY MOUTH ONCE DAILY   celecoxib 200 MG capsule Commonly known as: CeleBREX Take 1 capsule by mouth 2 times daily as needed for pain   Cholecalciferol 50 MCG (2000 UT) Tabs Take 1 tablet (2,000 Units total) by mouth daily.   Dexcom G6 Receiver Devi 1 Act by Does not apply route daily.   Dexcom G6 Transmitter Misc Use as directed   Dexcom G7 Sensor Misc Change sensor every 10 days   Gvoke HypoPen 2-Pack 1 MG/0.2ML Soaj Generic drug: Glucagon Inject 1 pen into the skin daily as needed.   HYDROcodone-acetaminophen 7.5-325 MG tablet Commonly known as: NORCO Take 1 tablet by mouth every 4-6 hours as needed for pain.   Mounjaro 5 MG/0.5ML Pen Generic drug: tirzepatide Inject 5 mg into the skin once a week. What changed: Another medication with the same name was added. Make sure you understand how and when to take each. Changed by: Reather Littler, MD   tirzepatide 10 MG/0.5ML Pen Commonly known as: MOUNJARO Inject 10 mg into the skin once a week. What changed: You were already taking a medication with the same name, and this prescription was added. Make sure you understand how and when to take each. Changed by: Reather Littler, MD   tirzepatide 15 MG/0.5ML Pen Commonly known as: MOUNJARO Inject 15 mg into the skin once a week. What changed: You were already taking a medication with the same name, and this prescription was added. Make sure you understand how and when to take each. Changed by: Reather Littler, MD   mupirocin ointment 2 % Commonly known as: BACTROBAN Apply topically 2 times daily.   naproxen sodium 220 MG tablet Commonly known as: ALEVE Take 2 tablets by mouth for the first dose, then take 1 tablet by mouth every 8-12 hours.   Nexlizet 180-10 MG Tabs Generic drug: Bempedoic Acid-Ezetimibe Take 1 tablet by mouth daily.   NovoLOG FlexPen 100 UNIT/ML FlexPen Generic drug: insulin aspart Inject 30 Units into the  skin 3 (three) times daily with meals.   OneTouch Delica Plus Lancet33G Misc USE TO CHECK BLOOD GLUCOSE THREE TIMES DAILY   OneTouch [employer] Flex System w/Device Kit 1 Act by Does not apply route 3 (three) times daily.   rosuvastatin 10 MG tablet Commonly known as: CRESTOR TAKE 1 TABLET BY MOUTH ONCE DAILY   sulfamethoxazole-trimethoprim 800-160 MG tablet Commonly known as: BACTRIM DS Take 1 tablet by mouth 2 times daily.   tadalafil 5 MG tablet Commonly known as: CIALIS Take 1 tablet by mouth daily.   Evaristo [employer] FlexTouch  200 UNIT/ML FlexTouch Pen Generic drug: insulin degludec Inject 270 Units into the skin daily. What changed: how much to take   Xigduo XR 10-998 MG Tb24 Generic drug: Dapagliflozin Pro-metFORMIN ER Take 2 tablets by mouth daily.        Allergies: No Known Allergies  Past Medical History:  Diagnosis Date   Asthma    Diabetes mellitus without complication (HCC)    Environmental allergies    Hyperlipidemia    Hypertension     No past surgical history on file.  Family History  Problem Relation Age of Onset   Alcohol abuse Other    Arthritis Other    Hypertension Other    Diabetes Other    Obesity Mother    Hypertension Mother    Obesity Father    Hypertension Father    Cancer Neg Hx    Heart disease Neg Hx    Stroke Neg Hx    Kidney disease Neg Hx     Social History:  reports that he has never smoked. He has never used smokeless tobacco. He reports that he does not drink alcohol and does not use drugs.  Review of Systems:  Last diabetic eye exam date 5/23  Last foot exam date: 7/23  Symptoms of neuropathy: None  Hypertension:   Treatment includes candesartan prescribed by PCP  BP Readings from Last 3 Encounters:  07/12/22 118/74  07/11/22 124/82  03/14/22 118/70    Lipid management: Previously on Crestor 10 mg daily He has now been given Nexlizet by his PCP also with following results He has low HDL    Lab Results   Component Value Date   CHOL 111 03/07/2022   CHOL 214 (H) 12/26/2021   CHOL 143 10/18/2020   Lab Results  Component Value Date   HDL 31.10 (L) 03/07/2022   HDL 34.70 (L) 12/26/2021   HDL 34.40 (L) 10/18/2020   Lab Results  Component Value Date   LDLCALC 50 03/07/2022   LDLCALC 152 (H) 12/26/2021   LDLCALC 90 10/18/2020   Lab Results  Component Value Date   TRIG 147.0 03/07/2022   TRIG 135.0 12/26/2021   TRIG 93.0 10/18/2020   Lab Results  Component Value Date   CHOLHDL 4 03/07/2022   CHOLHDL 6 12/26/2021   CHOLHDL 4 10/18/2020   Lab Results  Component Value Date   LDLDIRECT 158.1 02/07/2012     Examination:   BP 118/74   Pulse 100   Ht 6\' 5"  (1.956 m)   Wt (!) 350 lb 9.6 oz (159 kg)   SpO2 97%   BMI 41.58 kg/m   Body mass index is 41.58 kg/m.    ASSESSMENT/ PLAN:    Diabetes type 2:    Current regimen: Mounjaro 5 mg weekly, Farxiga and 270 units basal insulin daily  See history of present illness for detailed discussion of current diabetes management, blood sugar patterns and problems identified  A1c is about the same at 10.7 compared to 10.6  Blood glucose control is still not improved although looking better in the last week He is highly insulin resistant, difficult to currently have him lose weight or follow consistent diet Only benefiting minimally from current dose of 5 mg Mounjaro  HYPERTENSION: This is controlled   RECOMMENDATIONS:  Start increasing MOUNJARO If tolerated he can try 10 mg and may be able to use 2 of the 5 mg doses Written prescription given for 10 mg for 30 days and then 15 mg if tolerated  Need to try and improve diet with cutting back on snacks, carbohydrates, sweets and smaller portions To try and take NovoLog 10 to 15 minutes before eating if possible and take it consistently Will increase the dose to 35-40 units for larger meals or if he has a larger portion than anticipated with supplemental insulin Increase Tresiba  to 200 Agreed that he may be a candidate for bariatric surgery and he wants to think about it, will refer when he is ready  Reminded him of the need to starting regular exercise program either walking or other activity To try and follow-up with nutritionist or diabetes educator   Patient Instructions  Tresiba 200, Novolog upto 40 units at meals  Mounjaro last 2 shots together then take 10 mg for 4 weeks then 15mg  weekly  Exercise daily Total visit time including counseling = 30 minutes  Reather Littler 07/12/2022, 4:48 PM

## 2022-07-16 LAB — VITAMIN B1: Vitamin B1 (Thiamine): <6 nmol/L — ABNORMAL LOW (ref 8–30)

## 2022-07-17 ENCOUNTER — Other Ambulatory Visit (HOSPITAL_COMMUNITY): Payer: Self-pay

## 2022-07-17 ENCOUNTER — Encounter: Payer: Self-pay | Admitting: Endocrinology

## 2022-07-17 DIAGNOSIS — E5111 Dry beriberi: Secondary | ICD-10-CM | POA: Insufficient documentation

## 2022-07-17 MED ORDER — THIAMINE HCL 100 MG PO TABS
100.0000 mg | ORAL_TABLET | Freq: Every day | ORAL | 1 refills | Status: DC
  Filled 2022-07-17: qty 90, 90d supply, fill #0

## 2022-07-20 ENCOUNTER — Other Ambulatory Visit (HOSPITAL_COMMUNITY): Payer: Self-pay

## 2022-07-20 ENCOUNTER — Other Ambulatory Visit: Payer: Self-pay | Admitting: Endocrinology

## 2022-07-23 ENCOUNTER — Other Ambulatory Visit (HOSPITAL_COMMUNITY): Payer: Self-pay

## 2022-07-23 MED ORDER — DEXCOM G7 SENSOR MISC
1.0000 | 3 refills | Status: DC
  Filled 2022-07-23: qty 3, 30d supply, fill #0
  Filled 2022-09-04: qty 3, 30d supply, fill #1
  Filled 2022-10-12 (×2): qty 3, 30d supply, fill #2
  Filled 2022-11-15 – 2022-11-22 (×2): qty 3, 30d supply, fill #3

## 2022-07-24 ENCOUNTER — Other Ambulatory Visit (HOSPITAL_COMMUNITY): Payer: Self-pay

## 2022-08-28 ENCOUNTER — Other Ambulatory Visit (HOSPITAL_COMMUNITY): Payer: Self-pay

## 2022-08-28 ENCOUNTER — Other Ambulatory Visit: Payer: Self-pay

## 2022-09-04 ENCOUNTER — Other Ambulatory Visit (HOSPITAL_COMMUNITY): Payer: Self-pay

## 2022-09-07 ENCOUNTER — Other Ambulatory Visit (HOSPITAL_COMMUNITY): Payer: Self-pay

## 2022-09-07 ENCOUNTER — Telehealth: Payer: BC Managed Care – PPO | Admitting: Nurse Practitioner

## 2022-09-07 DIAGNOSIS — J40 Bronchitis, not specified as acute or chronic: Secondary | ICD-10-CM

## 2022-09-07 MED ORDER — AZITHROMYCIN 250 MG PO TABS
ORAL_TABLET | ORAL | 0 refills | Status: AC
  Filled 2022-09-07: qty 6, 5d supply, fill #0

## 2022-09-07 MED ORDER — ALBUTEROL SULFATE HFA 108 (90 BASE) MCG/ACT IN AERS
2.0000 | INHALATION_SPRAY | Freq: Four times a day (QID) | RESPIRATORY_TRACT | 0 refills | Status: AC | PRN
  Filled 2022-09-07: qty 6.7, 25d supply, fill #0

## 2022-09-07 MED ORDER — PREDNISONE 20 MG PO TABS
20.0000 mg | ORAL_TABLET | Freq: Every day | ORAL | 0 refills | Status: AC
  Filled 2022-09-07: qty 5, 5d supply, fill #0

## 2022-09-07 NOTE — Progress Notes (Signed)
Virtual Visit Consent   [name], you are scheduled for a virtual visit with a [facility] provider today. Just as with appointments in the office, your consent must be obtained to participate. Your consent will be active for this visit and any virtual visit you may have with one of our providers in the next 365 days. If you have a MyChart account, a copy of this consent can be sent to you electronically.  As this is a virtual visit, video technology does not allow for your provider to perform a traditional examination. This may limit your provider's ability to fully assess your condition. If your provider identifies any concerns that need to be evaluated in person or the need to arrange testing (such as labs, EKG, etc.), we will make arrangements to do so. Although advances in technology are sophisticated, we cannot ensure that it will always work on either your end or our end. If the connection with a video visit is poor, the visit may have to be switched to a telephone visit. With either a video or telephone visit, we are not always able to ensure that we have a secure connection.  By engaging in this virtual visit, you consent to the provision of healthcare and authorize for your insurance to be billed (if applicable) for the services provided during this visit. Depending on your insurance coverage, you may receive a charge related to this service.  I need to obtain your verbal consent now. Are you willing to proceed with your visit today? [name] has provided verbal consent on 09/07/2022 for a virtual visit (video or telephone). Apolonio Schneiders, FNP  Date: 09/07/2022 8:30 AM  Virtual Visit via Video Note   I, Apolonio Schneiders, connected with  Stephen Hall  ([id number], 02-11-1981) on 09/07/22 at  8:30 AM EST by a video-enabled telemedicine application and verified that I am speaking with the correct person using two identifiers.  Location: Patient: Virtual Visit Location Patient:  Home Provider: Virtual Visit Location Provider: Home Office   I discussed the limitations of evaluation and management by telemedicine and the availability of in person appointments. The patient expressed understanding and agreed to proceed.    History of Present Illness: Stephen Hall is a 42 y.o. who identifies as a male who was assigned male at birth, and is being seen today for an ongoing cough.  Symptom onset was 3 weeks ago  Started with a PND "clear his throat" type of a a cough that started 3 weeks ago  Last night his cough became more of a coughing fit without any production of mucous   This is his first visit sine being ill   He did initially have a runny nose at the start of illness for about 2 days that resolved  He did take a COVID test initially that was negative   Feels his symptoms started 2/12 Had asthma as a child that he has outgrown   He did have COVID once in 11/22 took Paxlovid, no need for inhalers at that time   When he is having his coughing episodes he denies tightness in chest or wheezing   Denies any fevers in the past 3 weeks   Type 2 DM monitors blood glucose with Dexcom is not on sliding scale  Takes insulin in the am   Problems:  Patient Active Problem List   Diagnosis Date Noted   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   Tachycardia 07/11/2022   Numbness and tingling in both hands 07/11/2022  Encounter for general adult medical examination with abnormal findings 01/11/2022   Painful erection 01/11/2022   Sleep paralysis, recurrent isolated 03/13/2021   Morbid obesity with BMI of 40.0-44.9, adult ([place]) 03/13/2021   Type 2 diabetes mellitus with microalbuminuria, without long-term current use of insulin ([place]) 10/18/2020   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   Vitamin D deficiency disease 12/18/2017   Excessive daytime sleepiness 01/01/2017   Essential hypertension 11/20/2016   Hyperlipidemia with target LDL less than 130  07/12/2014   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 (Patient not taking: Reported on 07/12/2022), Disp: 30 capsule, Rfl: 0   Bempedoic Acid-Ezetimibe (NEXLIZET) 180-10 MG TABS, Take 1 tablet by mouth daily., Disp: 90 tablet, Rfl: 1   Blood Glucose Monitoring Suppl (ONETOUCH VERIO FLEX SYSTEM) w/Device KIT, 1 Act by Does not apply route 3 (three) times daily., Disp: 2 kit, Rfl: 0   Candesartan Cilexetil-HCTZ 32-25 MG TABS, TAKE 1 TABLET BY MOUTH ONCE DAILY, Disp: 90 tablet, Rfl: 0   celecoxib (CELEBREX) 200 MG capsule, Take 1 capsule by mouth 2 times daily as needed for pain, Disp: 30 capsule, Rfl: 1   Cholecalciferol 50 MCG (2000 UT) TABS, Take 1 tablet (2,000 Units total) by mouth daily. (Patient not taking: Reported on 07/12/2022), Disp: 90 tablet, Rfl: 3   Continuous Blood Gluc Receiver ([place]) DEVI, 1 Act by Does not apply route daily. (Patient not taking: Reported on 07/12/2022), Disp: 1 each, Rfl: 5   Continuous Blood Gluc Sensor (DEXCOM G7 SENSOR) MISC, Change sensor every 10 days, Disp: 3 each, Rfl: 3   Continuous Blood Gluc Transmit (DEXCOM G6 TRANSMITTER) MISC, Use as directed, Disp: 1 each, Rfl: 5   Dapagliflozin Pro-metFORMIN ER (XIGDUO XR) 10-998 MG TB24, Take 2 tablets by mouth daily., Disp: 180 tablet, Rfl: 3   Glucagon (GVOKE HYPOPEN 2-PACK) 1 MG/0.2ML SOAJ, Inject 1 pen into the skin daily as needed., Disp: 2 mL, Rfl: 5   HYDROcodone-acetaminophen (NORCO) 7.5-325 MG tablet, Take 1 tablet by mouth every 4-6 hours as needed for pain., Disp: 12 tablet, Rfl: 0   insulin aspart  (NOVOLOG FLEXPEN) 100 UNIT/ML FlexPen, Inject 30 Units into the skin 3 (three) times daily with meals., Disp: 15 mL, Rfl: 11   insulin degludec (TRESIBA FLEXTOUCH) 200 UNIT/ML FlexTouch Pen, Inject 270 Units into the skin daily. (Patient taking differently: Inject 180 Units into the skin daily.), Disp: 180 mL, Rfl: 3   Insulin Pen Needle (BD PEN NEEDLE MICRO U/F) 32G X 6 MM MISC, Use as directed morning, noon, in the evening and at bedtime., Disp: 300 each, Rfl: 1   Lancets (ONETOUCH DELICA PLUS 123XX123) MISC, USE TO CHECK BLOOD GLUCOSE THREE TIMES DAILY, Disp: 100 each, Rfl: 1   naproxen sodium (ALEVE) 220 MG tablet, Take 2 tablets by mouth for the first dose, then take 1 tablet by mouth every 8-12 hours., Disp: 24 tablet, Rfl: 0   rosuvastatin (CRESTOR) 10 MG tablet, TAKE 1 TABLET BY MOUTH ONCE DAILY, Disp: 90 tablet, Rfl: 1   tadalafil (CIALIS) 5 MG tablet, Take 1 tablet by mouth  daily., Disp: 90 tablet, Rfl: 3   thiamine (VITAMIN B1) 100 MG tablet, Take 1 tablet (100 mg total) by mouth daily., Disp: 90 tablet, Rfl: 1   tirzepatide (MOUNJARO) 10 MG/0.5ML Pen, Inject 10 mg into the skin once a week., Disp: 3 mL, Rfl: 0   tirzepatide (MOUNJARO) 15 MG/0.5ML Pen, Inject 15 mg into the skin once a week., Disp: 3 mL, Rfl: 3   tirzepatide (MOUNJARO) 5 MG/0.5ML Pen, Inject 5 mg into the skin once a week., Disp: 6 mL, Rfl: 1  Observations/Objective: Patient is well-developed, well-nourished in no acute distress.  Resting comfortably  at home.  Head is normocephalic, atraumatic.  No labored breathing.  Speech is clear and coherent with logical content.  Patient is alert and oriented at baseline.    Assessment and Plan: 1. Bronchitis  Meds ordered this encounter  Medications   azithromycin (ZITHROMAX) 250 MG tablet    Sig: Take 2 tablets on day 1, then 1 tablet daily on days 2 through 5    Dispense:  6 tablet    Refill:  0   albuterol (VENTOLIN HFA) 108 (90 Base) MCG/ACT inhaler    Sig:  Inhale 2 puffs into the lungs every 6 (six) hours as needed for wheezing or shortness of breath.    Dispense:  8 g    Refill:  0   predniSONE (DELTASONE) 20 MG tablet    Sig: Take 1 tablet (20 mg total) by mouth daily with breakfast for 5 days.    Dispense:  5 tablet    Refill:  0    Also advised Mucinex over the counter for relief of mucous and cough  Discussed reasons for follow up and signs of pneumonia       Follow Up Instructions: I discussed the assessment and treatment plan with the patient. The patient was provided an opportunity to ask questions and all were answered. The patient agreed with the plan and demonstrated an understanding of the instructions.  A copy of instructions were sent to the patient via MyChart unless otherwise noted below.    The patient was advised to call back or seek an in-person evaluation if the symptoms worsen or if the condition fails to improve as anticipated.  Time:  I spent 15 minutes with the patient via telehealth technology discussing the above problems/concerns.    Apolonio Schneiders, FNP

## 2022-10-12 ENCOUNTER — Other Ambulatory Visit: Payer: Self-pay | Admitting: Endocrinology

## 2022-10-12 ENCOUNTER — Other Ambulatory Visit: Payer: Self-pay | Admitting: Internal Medicine

## 2022-10-12 ENCOUNTER — Other Ambulatory Visit (HOSPITAL_COMMUNITY): Payer: Self-pay

## 2022-10-12 DIAGNOSIS — E785 Hyperlipidemia, unspecified: Secondary | ICD-10-CM

## 2022-10-12 DIAGNOSIS — E1165 Type 2 diabetes mellitus with hyperglycemia: Secondary | ICD-10-CM

## 2022-10-12 DIAGNOSIS — E7801 Familial hypercholesterolemia: Secondary | ICD-10-CM

## 2022-10-12 DIAGNOSIS — E1169 Type 2 diabetes mellitus with other specified complication: Secondary | ICD-10-CM

## 2022-10-12 MED ORDER — MOUNJARO 5 MG/0.5ML ~~LOC~~ SOAJ
5.0000 mg | SUBCUTANEOUS | 1 refills | Status: DC
  Filled 2022-10-12: qty 2, 28d supply, fill #0

## 2022-10-12 MED ORDER — ROSUVASTATIN CALCIUM 10 MG PO TABS
10.0000 mg | ORAL_TABLET | Freq: Every day | ORAL | 1 refills | Status: DC
  Filled 2022-10-12: qty 90, 90d supply, fill #0
  Filled 2023-03-08: qty 90, 90d supply, fill #1

## 2022-10-12 NOTE — Telephone Encounter (Signed)
Spoke with patient to confirm current Mounjaro dose, he states he has been taking the 5mg  only, he reports he has never increased his dose. Per chart 10mg  and 15mg  were sent to pharmacy in January, but patient states he never picked either of these up.  5mg  dose refilled   FYI - thank you!

## 2022-10-13 ENCOUNTER — Other Ambulatory Visit (HOSPITAL_COMMUNITY): Payer: Self-pay

## 2022-10-24 ENCOUNTER — Ambulatory Visit: Payer: BC Managed Care – PPO | Admitting: Endocrinology

## 2022-10-24 ENCOUNTER — Other Ambulatory Visit (HOSPITAL_COMMUNITY): Payer: Self-pay

## 2022-10-24 ENCOUNTER — Other Ambulatory Visit: Payer: Self-pay | Admitting: Endocrinology

## 2022-10-24 ENCOUNTER — Encounter: Payer: Self-pay | Admitting: Endocrinology

## 2022-10-24 VITALS — BP 110/62 | HR 103 | Ht 77.0 in | Wt 348.0 lb

## 2022-10-24 DIAGNOSIS — Z794 Long term (current) use of insulin: Secondary | ICD-10-CM | POA: Diagnosis not present

## 2022-10-24 DIAGNOSIS — E1165 Type 2 diabetes mellitus with hyperglycemia: Secondary | ICD-10-CM | POA: Diagnosis not present

## 2022-10-24 DIAGNOSIS — Z7984 Long term (current) use of oral hypoglycemic drugs: Secondary | ICD-10-CM

## 2022-10-24 DIAGNOSIS — Z7985 Long-term (current) use of injectable non-insulin antidiabetic drugs: Secondary | ICD-10-CM | POA: Diagnosis not present

## 2022-10-24 LAB — POCT GLYCOSYLATED HEMOGLOBIN (HGB A1C): Hemoglobin A1C: 8.8 % — AB (ref 4.0–5.6)

## 2022-10-24 MED ORDER — TIRZEPATIDE 10 MG/0.5ML ~~LOC~~ SOAJ
10.0000 mg | SUBCUTANEOUS | 2 refills | Status: DC
  Filled 2022-10-24: qty 2, 28d supply, fill #0
  Filled 2022-11-27 – 2023-01-13 (×2): qty 2, 28d supply, fill #1
  Filled 2023-02-08 (×2): qty 2, 28d supply, fill #2
  Filled 2023-06-21: qty 2, 28d supply, fill #3

## 2022-10-24 MED ORDER — TIRZEPATIDE 10 MG/0.5ML ~~LOC~~ SOAJ: 10.0000 mg | SUBCUTANEOUS | 0 refills | Status: DC

## 2022-10-24 NOTE — Progress Notes (Signed)
Patient ID: Stephen Hall, male   DOB: [birth year], 42 y.o.   MRN: [id number]           Reason for Appointment: Type II Diabetes follow-up   History of Present Illness   Previous history:  Initial diagnosis: 2015  Non-insulin hypoglycemic drugs previously used: Prandin,?  Metformin Insulin was started in 2017 Also previously used Soliqua, Lantus insulin, Humalog  A1c range in the last few years is: 7.8-13.6  Recent history:     Non-insulin hypoglycemic drugs: Mounjaro 5 mg weekly, Xigduo 10/998, 1 tablet daily     Insulin regimen: 180 units of U-200 Tresiba daily, NovoLog 30 units before meals           Side effects from medications: Mild stomach upset from [place]  Current self management, blood sugar patterns and problems identified:  A1c is better than usual at 8.8, previously 10.6  He was supposed to start increasing his Mounjaro up to 10 mg after his visit in January but he did not do so, had been given a written prescription However he thinks that he is trying to improve his diet and Mounjaro seems to help He has seen the dietitian but is also trying to get more information online Overall has been able to cut back on portions but there is no weight loss Also he is still continuing to have regular soft drinks every other day with dinnertime but does not compensate with higher doses of NovoLog when he does this Although he thinks he feels a little shaky before lunch or dinner sometimes he has no hypoglycemia Blood sugars are mostly highest after dinner In the morning he may take his NovoLog an hour before eating before going to work Not doing any formal exercise but trying to walk a little more overall  CGM use % of time   2-week average/GV 203/36  Time in range  44      %  % Time Above 180 31  % Time above 250 25  % Time Below 70      PRE-MEAL Fasting Lunch Dinner Bedtime Overall  Glucose range:       Averages: 152       POST-MEAL PC Breakfast PC Lunch PC  Dinner  Glucose range:     Averages:  242 246     Interpretation of the G7 Dexcom sensor download, dates 4/14 through 4/23  Time in range is improved from the last visit and up to 44% Overnight blood sugars are starting of nearly 250 at midnight and then generally declining progressively until 6 AM with some variability but no hypoglycemia Blood sugars appear to be mostly higher in the first week of the data compared to the last 7 days at all times Periods of hyperglycemia noted generally after meals and more consistently after dinner Again has significant variability overall with CV 36 Blood sugars after breakfast are frequently spiking above 180 but usually come down significantly before lunch except on a couple of occasions Blood sugars after lunch are also mostly high with some variability and generally improving to some degree by dinnertime Blood sugars are mostly above 180 and as much is 350 after dinner and high readings may persist into the night on some days No hypoglycemia   CGM use % of time 86  2-week average/GV 1 week average 228/35 184/30  Time in range       28%  % Time Above 180 38  % Time above 250 33  % Time  Below 70 1   Exercise: none   Diet management: Frequently eating fast food breakfast and lunch     Meals: 8.30 am biscuit, lunch 12 noon: drink, dinner with regular soft drink, meat and vegetables  Dietician visit: Most recent: 9/23  Weight control:  Wt Readings from Last 3 Encounters:  10/24/22 (!) 348 lb (157.9 kg)  07/12/22 (!) 350 lb 9.6 oz (159 kg)  07/11/22 (!) 348 lb (157.9 kg)            Diabetes labs:  Lab Results  Component Value Date   HGBA1C 8.8 (A) 10/24/2022   HGBA1C 10.7 (H) 07/10/2022   HGBA1C 10.6 (A) 12/26/2021   Lab Results  Component Value Date   MICROALBUR <0.7 12/26/2021   LDLCALC 50 03/07/2022   CREATININE 0.96 07/10/2022     Allergies as of 10/24/2022   No Known Allergies      Medication List         Accurate as of October 24, 2022  9:33 AM. If you have any questions, ask your nurse or doctor.          STOP taking these medications    amoxicillin 500 MG capsule Commonly known as: AMOXIL Stopped by: Reather Littler, MD       TAKE these medications    albuterol 108 (90 Base) MCG/ACT inhaler Commonly known as: VENTOLIN HFA Inhale 2 puffs into the lungs every 6 (six) hours as needed for wheezing or shortness of breath.   BD Pen Needle Micro U/F 32G X 6 MM Misc Generic drug: Insulin Pen Needle Use as directed morning, noon, in the evening and at bedtime.   Candesartan Cilexetil-HCTZ 32-25 MG Tabs TAKE 1 TABLET BY MOUTH ONCE DAILY   celecoxib 200 MG capsule Commonly known as: CeleBREX Take 1 capsule by mouth 2 times daily as needed for pain   Cholecalciferol 50 MCG (2000 UT) Tabs Take 1 tablet (2,000 Units total) by mouth daily.   Dexcom G6 Receiver Devi 1 Act by Does not apply route daily.   Dexcom G6 Transmitter Misc Use as directed   Dexcom G7 Sensor Misc Change sensor every 10 days   Gvoke HypoPen 2-Pack 1 MG/0.2ML Soaj Generic drug: Glucagon Inject 1 pen into the skin daily as needed.   HYDROcodone-acetaminophen 7.5-325 MG tablet Commonly known as: NORCO Take 1 tablet by mouth every 4-6 hours as needed for pain.   naproxen sodium 220 MG tablet Commonly known as: ALEVE Take 2 tablets by mouth for the first dose, then take 1 tablet by mouth every 8-12 hours.   Nexlizet 180-10 MG Tabs Generic drug: Bempedoic Acid-Ezetimibe Take 1 tablet by mouth daily.   NovoLOG FlexPen 100 UNIT/ML FlexPen Generic drug: insulin aspart Inject 30 Units into the skin 3 (three) times daily with meals.   OneTouch Delica Plus Lancet33G Misc USE TO CHECK BLOOD GLUCOSE THREE TIMES DAILY   OneTouch Verio Flex System w/Device Kit 1 Act by Does not apply route 3 (three) times daily.   rosuvastatin 10 MG tablet Commonly known as: CRESTOR Take 1 tablet (10 mg total) by mouth  daily.   tadalafil 5 MG tablet Commonly known as: CIALIS Take 1 tablet by mouth daily.   thiamine 100 MG tablet Commonly known as: VITAMIN B1 Take 1 tablet (100 mg total) by mouth daily.   tirzepatide 10 MG/0.5ML Pen Commonly known as: MOUNJARO Inject 10 mg into the skin once a week. What changed: Another medication with the same name was removed. Continue  taking this medication, and follow the directions you see here. Changed by: Reather Littler, MD   Evaristo Bury FlexTouch 200 UNIT/ML FlexTouch Pen Generic drug: insulin degludec Inject 270 Units into the skin daily. What changed: how much to take   Xigduo XR 10-998 MG Tb24 Generic drug: Dapagliflozin Pro-metFORMIN ER Take 2 tablets by mouth daily.        Allergies: No Known Allergies  Past Medical History:  Diagnosis Date   Asthma    Diabetes mellitus without complication    Environmental allergies    Hyperlipidemia    Hypertension     No past surgical history on file.  Family History  Problem Relation Age of Onset   Alcohol abuse Other    Arthritis Other    Hypertension Other    Diabetes Other    Obesity Mother    Hypertension Mother    Obesity Father    Hypertension Father    Cancer Neg Hx    Heart disease Neg Hx    Stroke Neg Hx    Kidney disease Neg Hx     Social History:  reports that he has never smoked. He has never used smokeless tobacco. He reports that he does not drink alcohol and does not use drugs.  Review of Systems:  Last diabetic eye exam date 5/23  Last foot exam date: 7/23  Symptoms of neuropathy: None  Hypertension:   Treatment includes candesartan prescribed by PCP  BP Readings from Last 3 Encounters:  10/24/22 110/62  07/12/22 118/74  07/11/22 124/82    Lipid management: Previously on Crestor 10 mg daily He has now been given Nexlizet by his PCP also with following results He has low HDL    Lab Results  Component Value Date   CHOL 111 03/07/2022   CHOL 214 (H) 12/26/2021    CHOL 143 10/18/2020   Lab Results  Component Value Date   HDL 31.10 (L) 03/07/2022   HDL 34.70 (L) 12/26/2021   HDL 34.40 (L) 10/18/2020   Lab Results  Component Value Date   LDLCALC 50 03/07/2022   LDLCALC 152 (H) 12/26/2021   LDLCALC 90 10/18/2020   Lab Results  Component Value Date   TRIG 147.0 03/07/2022   TRIG 135.0 12/26/2021   TRIG 93.0 10/18/2020   Lab Results  Component Value Date   CHOLHDL 4 03/07/2022   CHOLHDL 6 12/26/2021   CHOLHDL 4 10/18/2020   Lab Results  Component Value Date   LDLDIRECT 158.1 02/07/2012     Examination:   BP 110/62 (BP Location: Left Arm, Patient Position: Sitting, Cuff Size: Large)   Pulse (!) 103   Ht 6\' 5"  (1.956 m)   Wt (!) 348 lb (157.9 kg)   SpO2 98%   BMI 41.27 kg/m   Body mass index is 41.27 kg/m.    ASSESSMENT/ PLAN:    Diabetes type 2:    Current regimen: Mounjaro 5 mg weekly, Farxiga and 270 units basal insulin daily  See history of present illness for detailed discussion of current diabetes management, blood sugar patterns and problems identified  A1c is 8.8, previously 10.7  Blood glucose control is somewhat better likely from more efforts to improve his diet He is still insulin resistant and unable to get his weight down Benefiting some from current dose of 5 mg Mounjaro but needs to go up to higher doses  HYPERTENSION: This is controlled   RECOMMENDATIONS:  Try to increase MOUNJARO if higher doses are now available For now  10 mg and may be able to go up to 15 mg subsequently Discussed blood sugar targets after meals preferably under 180 Add exercise with brisk walking up to 10 miles a week or other activities like basketball Try to cut back on amounts of regular soft drinks For low carbohydrate and smaller meals May take only 25 NovoLog such as at breakfast or low carb meals in the evening May also need 10 units to cover a sandwich if using this for bedtime snack If blood sugar is over 150 before  eating may add another 5 units NovoLog Use NovoLog 35 units to cover any soft drinks at dinnertime instead of 30 and also may need 35 units for larger meals or fast food He may benefit from repeat nutritional consultation but he does not want to do this No change in [place] as yet Will send referral for bariatric surgery and he will discuss further on follow-up consultation  Follow-up in 3 months   Patient Instructions  Exercise with brisk walking up to 10 miles a week or other activities like basketball  Try to cut back on amounts of regular soft drinks  For low carbohydrate and smaller meals May take only 25 NovoLog  If blood sugar is over 150 before eating may add another 5 units NovoLog  Use NovoLog 35 units to cover any soft drinks at dinnertime instead of 30 and also may need 35 units for larger meals or fast food  Total visit time including counseling = 30 minutes  Reather Littler 10/24/2022, 9:33 AM

## 2022-10-24 NOTE — Patient Instructions (Addendum)
Exercise with brisk walking up to 10 miles a week or other activities like basketball  Try to cut back on amounts of regular soft drinks  For low carbohydrate and smaller meals May take only 25 NovoLog  If blood sugar is over 150 before eating may add another 5 units NovoLog  Use NovoLog 35 units to cover any soft drinks at dinnertime instead of 30 and also may need 35 units for larger meals or fast food

## 2022-10-29 ENCOUNTER — Other Ambulatory Visit: Payer: Self-pay | Admitting: Internal Medicine

## 2022-10-29 ENCOUNTER — Other Ambulatory Visit (HOSPITAL_COMMUNITY): Payer: Self-pay

## 2022-10-29 DIAGNOSIS — E669 Obesity, unspecified: Secondary | ICD-10-CM

## 2022-10-29 DIAGNOSIS — I10 Essential (primary) hypertension: Secondary | ICD-10-CM

## 2022-10-29 MED ORDER — CANDESARTAN CILEXETIL-HCTZ 32-25 MG PO TABS
1.0000 | ORAL_TABLET | Freq: Every day | ORAL | 0 refills | Status: DC
Start: 2022-10-29 — End: 2023-03-08
  Filled 2022-10-29: qty 90, 90d supply, fill #0

## 2022-10-30 ENCOUNTER — Other Ambulatory Visit (HOSPITAL_COMMUNITY): Payer: Self-pay

## 2022-11-15 ENCOUNTER — Other Ambulatory Visit (HOSPITAL_COMMUNITY): Payer: Self-pay

## 2022-11-15 ENCOUNTER — Other Ambulatory Visit: Payer: Self-pay | Admitting: Internal Medicine

## 2022-11-15 DIAGNOSIS — E7801 Familial hypercholesterolemia: Secondary | ICD-10-CM

## 2022-11-15 DIAGNOSIS — E785 Hyperlipidemia, unspecified: Secondary | ICD-10-CM

## 2022-11-15 MED ORDER — NEXLIZET 180-10 MG PO TABS
1.0000 | ORAL_TABLET | Freq: Every day | ORAL | 1 refills | Status: DC
Start: 2022-11-15 — End: 2023-09-23
  Filled 2022-11-15: qty 90, 90d supply, fill #0
  Filled 2022-11-27: qty 30, 30d supply, fill #0
  Filled 2023-01-15: qty 30, 30d supply, fill #1
  Filled 2023-03-08: qty 30, 30d supply, fill #2
  Filled 2023-06-21: qty 30, 30d supply, fill #3
  Filled 2023-08-22: qty 30, 30d supply, fill #4

## 2022-11-16 ENCOUNTER — Other Ambulatory Visit (HOSPITAL_COMMUNITY): Payer: Self-pay

## 2022-11-20 ENCOUNTER — Other Ambulatory Visit: Payer: Self-pay | Admitting: Endocrinology

## 2022-11-20 ENCOUNTER — Other Ambulatory Visit (HOSPITAL_COMMUNITY): Payer: Self-pay

## 2022-11-22 ENCOUNTER — Other Ambulatory Visit (HOSPITAL_COMMUNITY): Payer: Self-pay

## 2022-11-22 ENCOUNTER — Other Ambulatory Visit: Payer: Self-pay

## 2022-11-23 ENCOUNTER — Other Ambulatory Visit (HOSPITAL_COMMUNITY): Payer: Self-pay

## 2022-11-24 ENCOUNTER — Other Ambulatory Visit (HOSPITAL_COMMUNITY): Payer: Self-pay

## 2022-11-24 MED ORDER — TRESIBA FLEXTOUCH 200 UNIT/ML ~~LOC~~ SOPN
180.0000 [IU] | PEN_INJECTOR | Freq: Every day | SUBCUTANEOUS | 3 refills | Status: DC
  Filled 2022-11-24: qty 21, 23d supply, fill #0
  Filled 2023-01-15: qty 21, 23d supply, fill #1
  Filled 2023-03-08: qty 21, 23d supply, fill #2
  Filled 2023-04-26: qty 21, 23d supply, fill #3
  Filled 2023-07-06: qty 21, 23d supply, fill #4

## 2022-11-27 ENCOUNTER — Other Ambulatory Visit: Payer: Self-pay

## 2022-11-27 ENCOUNTER — Telehealth: Payer: Self-pay

## 2022-11-27 ENCOUNTER — Other Ambulatory Visit (HOSPITAL_COMMUNITY): Payer: Self-pay

## 2022-11-27 DIAGNOSIS — E1169 Type 2 diabetes mellitus with other specified complication: Secondary | ICD-10-CM

## 2022-11-27 MED ORDER — DAPAGLIFLOZIN PRO-METFORMIN ER 5-1000 MG PO TB24
2.0000 | ORAL_TABLET | Freq: Every day | ORAL | 1 refills | Status: DC
Start: 2022-11-27 — End: 2023-01-31
  Filled 2022-11-27: qty 180, 90d supply, fill #0

## 2022-11-27 NOTE — Telephone Encounter (Signed)
Requested Prescriptions   Signed Prescriptions Disp Refills   Dapagliflozin Pro-metFORMIN ER (XIGDUO XR) 10-998 MG TB24 180 tablet 1    Sig: Take 2 tablets by mouth daily.    Authorizing Provider: Reather Littler    Ordering User: Pollie Meyer  ;;Sent

## 2022-11-27 NOTE — Telephone Encounter (Signed)
Patient called in stating that he needs a PA for his [employer].Please start this process.

## 2022-11-28 ENCOUNTER — Telehealth: Payer: Self-pay

## 2022-11-28 ENCOUNTER — Other Ambulatory Visit (HOSPITAL_COMMUNITY): Payer: Self-pay

## 2022-11-28 MED ORDER — TIRZEPATIDE 12.5 MG/0.5ML ~~LOC~~ SOAJ
12.5000 mg | SUBCUTANEOUS | 1 refills | Status: DC
  Filled 2022-11-28: qty 6, 84d supply, fill #0
  Filled 2023-03-08: qty 2, 28d supply, fill #1
  Filled 2023-04-26: qty 2, 28d supply, fill #2

## 2022-11-28 NOTE — Telephone Encounter (Signed)
Medication sent and patient advised.  

## 2022-11-28 NOTE — Telephone Encounter (Signed)
PA submitted, see additional encounter created for updates.

## 2022-11-28 NOTE — Telephone Encounter (Signed)
Patient called and states that he needs a new dosage of Mounjaro.  The current dosage that he is taking: Moujanro 10mg , but it is on back order. I reached out to his pharmacy and they have 12.5mg  and 7.5mg   Please advise in [place] absence

## 2022-11-28 NOTE — Telephone Encounter (Signed)
*  ENDO  PA request received via pharmacy/provider/CMM for Dexcom G7 Sensor  PA submitted to [employer] via CMM and is pending additional questions/determination  Key: [id number]

## 2022-11-29 ENCOUNTER — Other Ambulatory Visit (HOSPITAL_COMMUNITY): Payer: Self-pay

## 2022-11-29 NOTE — Telephone Encounter (Signed)
PA has been APPROVED from 11/28/2022-11/28/2023  Approval letter has been attached in patients media.

## 2022-11-30 ENCOUNTER — Other Ambulatory Visit (HOSPITAL_COMMUNITY): Payer: Self-pay

## 2022-11-30 NOTE — Telephone Encounter (Signed)
Copied and Paste from PA team message:  PA has been APPROVED from 11/28/2022-11/28/2023   Approval letter has been attached in patients media.  Pt has been notified.

## 2022-12-20 LAB — HM DIABETES EYE EXAM

## 2022-12-24 ENCOUNTER — Other Ambulatory Visit (HOSPITAL_COMMUNITY): Payer: Self-pay

## 2023-01-01 ENCOUNTER — Other Ambulatory Visit: Payer: Self-pay | Admitting: Endocrinology

## 2023-01-02 ENCOUNTER — Other Ambulatory Visit (HOSPITAL_COMMUNITY): Payer: Self-pay

## 2023-01-02 MED ORDER — DEXCOM G7 SENSOR MISC
1.0000 | 3 refills | Status: DC
  Filled 2023-01-02: qty 3, 30d supply, fill #0
  Filled 2023-02-07: qty 3, 30d supply, fill #1
  Filled 2023-03-08: qty 3, 30d supply, fill #2
  Filled 2023-04-26: qty 3, 30d supply, fill #3

## 2023-01-16 ENCOUNTER — Other Ambulatory Visit (HOSPITAL_COMMUNITY): Payer: Self-pay

## 2023-01-23 ENCOUNTER — Ambulatory Visit: Payer: BC Managed Care – PPO | Admitting: Internal Medicine

## 2023-01-28 ENCOUNTER — Other Ambulatory Visit: Payer: BC Managed Care – PPO

## 2023-01-29 ENCOUNTER — Other Ambulatory Visit (INDEPENDENT_AMBULATORY_CARE_PROVIDER_SITE_OTHER): Payer: BC Managed Care – PPO

## 2023-01-29 DIAGNOSIS — E1165 Type 2 diabetes mellitus with hyperglycemia: Secondary | ICD-10-CM | POA: Diagnosis not present

## 2023-01-29 LAB — BASIC METABOLIC PANEL
BUN: 10 mg/dL (ref 6–23)
CO2: 26 mEq/L (ref 19–32)
Calcium: 9.7 mg/dL (ref 8.4–10.5)
Chloride: 103 mEq/L (ref 96–112)
Creatinine, Ser: 0.91 mg/dL (ref 0.40–1.50)
GFR: 105.22 mL/min (ref >60.00)
Glucose, Bld: 108 mg/dL — ABNORMAL HIGH (ref 70–99)
Potassium: 4.2 mEq/L (ref 3.5–5.1)
Sodium: 138 mEq/L (ref 135–145)

## 2023-01-29 LAB — MICROALBUMIN / CREATININE URINE RATIO
Creatinine,U: 83.2 mg/dL
Microalb Creat Ratio: 0.8 mg/g (ref 0.0–30.0)
Microalb, Ur: <0.7 mg/dL (ref 0.0–1.9)

## 2023-01-29 LAB — HEMOGLOBIN A1C: Hgb A1c MFr Bld: 8.6 % — ABNORMAL HIGH (ref 4.6–6.5)

## 2023-01-30 ENCOUNTER — Encounter (INDEPENDENT_AMBULATORY_CARE_PROVIDER_SITE_OTHER): Payer: Self-pay

## 2023-01-31 ENCOUNTER — Other Ambulatory Visit (HOSPITAL_COMMUNITY): Payer: Self-pay

## 2023-01-31 ENCOUNTER — Ambulatory Visit: Payer: BC Managed Care – PPO | Admitting: Endocrinology

## 2023-01-31 ENCOUNTER — Encounter: Payer: Self-pay | Admitting: Endocrinology

## 2023-01-31 VITALS — BP 130/82 | HR 107 | Ht 77.0 in | Wt 338.6 lb

## 2023-01-31 DIAGNOSIS — E1165 Type 2 diabetes mellitus with hyperglycemia: Secondary | ICD-10-CM | POA: Diagnosis not present

## 2023-01-31 DIAGNOSIS — I10 Essential (primary) hypertension: Secondary | ICD-10-CM

## 2023-01-31 DIAGNOSIS — Z794 Long term (current) use of insulin: Secondary | ICD-10-CM | POA: Diagnosis not present

## 2023-01-31 MED ORDER — DAPAGLIFLOZIN PRO-METFORMIN ER 10-1000 MG PO TB24
1.0000 | ORAL_TABLET | Freq: Every day | ORAL | 1 refills | Status: DC
  Filled 2023-01-31 – 2023-06-21 (×2): qty 90, 90d supply, fill #0

## 2023-01-31 NOTE — Progress Notes (Signed)
Patient ID: Stephen Hall, male   DOB: October 04, 1980, 42 y.o.   MRN: [id number]           Reason for Appointment: Type II Diabetes follow-up   History of Present Illness   Previous history:  Initial diagnosis: 2015  Non-insulin hypoglycemic drugs previously used: Prandin,?  Metformin Insulin was started in 2017 Also previously used Soliqua, Lantus insulin, Humalog  A1c range in the last few years is: 7.8-13.6  Recent history:     Non-insulin hypoglycemic drugs: Mounjaro 10 mg weekly, Xigduo 10/998, 1 tablet daily     Insulin regimen: 180 units of U-200 Tresiba daily, NovoLog 30 units before meals           Side effects from medications: Mild stomach upset from [place]  Current self management, blood sugar patterns and problems identified:  A1c is only slightly better at 8.6 compared to 8.8  He says he is doing better with increasing his Mounjaro up to 10 mg after his last visit He is finding himself eating smaller portions with better satiety Although he tried 12.5 mg with 1 prescription that made him nauseated Especially in the last month or so he has cut back on higher fat foods, sweets and trying to eliminate drinks with sugar With this his blood sugars are overall significantly better Also in the last week or so he has done better with his postprandial blood sugar control compared to the previous week He says that he takes his NovoLog 30 minutes after eating otherwise he feels he may get low Overnight blood sugars are generally better especially in the last week GMI 7.1 Not doing any formal exercise again Has lost 10 pounds  Blood sugar data from Dexcom analysis:  CGM use % of time   2-week average/GV 203/36  Time in range  44      %  % Time Above 180 31  % Time above 250 25  % Time Below 70      PRE-MEAL Fasting Lunch Dinner Bedtime Overall  Glucose range:       Averages: 152       POST-MEAL PC Breakfast PC Lunch PC Dinner  Glucose range:     Averages:  242  246     Interpretation of the G7 Dexcom sensor download for the last 2 weeks  Time in range is improved from the last visit, now 66 compared to 44% Overnight blood sugars are consistently within the target range although relatively higher around midnight and then in the mid 100 range average the rest of the night without hypoglycemia Overall Premeal blood sugars are in the upper 100 range throughout the day, slightly lower at dinnertime POSTPRANDIAL spikes are seen inconsistently at all meals, usually not as much at lunchtime Blood sugar increase after meals is less frequent in the last week compared to the first week Postprandial reading after dinner but occasionally rises relatively quickly and then decrease Variability is moderate No hypoglycemia   Exercise: none   Diet management: Frequently eating fast food breakfast and lunch     Meals: 8.30 am biscuit, lunch 12 noon: drink, dinner with regular soft drink, meat and vegetables  Dietician visit: Most recent: 9/23  Weight control:  Wt Readings from Last 3 Encounters:  01/31/23 (!) 338 lb 9.6 oz (153.6 kg)  10/24/22 (!) 348 lb (157.9 kg)  07/12/22 (!) 350 lb 9.6 oz (159 kg)            Diabetes labs:  Lab Results  Component Value Date   HGBA1C 8.6 (H) 01/29/2023   HGBA1C 8.8 (A) 10/24/2022   HGBA1C 10.7 (H) 07/10/2022   Lab Results  Component Value Date   MICROALBUR <0.7 01/29/2023   LDLCALC 50 03/07/2022   CREATININE 0.91 01/29/2023     Allergies as of 01/31/2023   No Known Allergies      Medication List        Accurate as of January 31, 2023  3:17 PM. If you have any questions, ask your nurse or doctor.          STOP taking these medications    Xigduo XR 10-998 MG Tb24 Generic drug: Dapagliflozin Pro-metFORMIN ER Replaced by: Dapagliflozin Pro-metFORMIN ER 04-999 MG Tb24 Stopped by: Stephen Hall       TAKE these medications    albuterol 108 (90 Base) MCG/ACT inhaler Commonly known as: VENTOLIN  HFA Inhale 2 puffs into the lungs every 6 (six) hours as needed for wheezing or shortness of breath.   BD Pen Needle Micro U/F 32G X 6 MM Misc Generic drug: Insulin Pen Needle Use as directed morning, noon, in the evening and at bedtime.   Candesartan Cilexetil-HCTZ 32-25 MG Tabs Take 1 tablet by mouth daily.   celecoxib 200 MG capsule Commonly known as: CeleBREX Take 1 capsule by mouth 2 times daily as needed for pain   Cholecalciferol 50 MCG (2000 UT) Tabs Take 1 tablet (2,000 Units total) by mouth daily.   Dapagliflozin Pro-metFORMIN ER 04-999 MG Tb24 Commonly known as: Xigduo XR Take 1 tablet by mouth daily. Replaces: Xigduo XR 10-998 MG Tb24 Started by: Stephen Hall   Dexcom G6 Receiver Devi 1 Act by Does not apply route daily.   Dexcom G6 Transmitter Misc Use as directed   Dexcom G7 Sensor Misc Change sensor every 10 days   HYDROcodone-acetaminophen 7.5-325 MG tablet Commonly known as: NORCO Take 1 tablet by mouth every 4-6 hours as needed for pain.   Mounjaro 10 MG/0.5ML Pen Generic drug: tirzepatide Inject 10 mg into the skin once a week.   Mounjaro 12.5 MG/0.5ML Pen Generic drug: tirzepatide Inject 12.5 mg (0.5 ml) into the skin once a week.   naproxen sodium 220 MG tablet Commonly known as: ALEVE Take 2 tablets by mouth for the first dose, then take 1 tablet by mouth every 8-12 hours.   Nexlizet 180-10 MG Tabs Generic drug: Bempedoic Acid-Ezetimibe Take 1 tablet by mouth daily.   NovoLOG FlexPen 100 UNIT/ML FlexPen Generic drug: insulin aspart Inject 30 Units into the skin 3 (three) times daily with meals.   OneTouch Delica Plus Lancet33G Misc USE TO CHECK BLOOD GLUCOSE THREE TIMES DAILY   OneTouch Verio Flex System w/Device Kit 1 Act by Does not apply route 3 (three) times daily.   rosuvastatin 10 MG tablet Commonly known as: CRESTOR Take 1 tablet (10 mg total) by mouth daily.   tadalafil 5 MG tablet Commonly known as: CIALIS Take 1 tablet  by mouth daily.   thiamine 100 MG tablet Commonly known as: VITAMIN B1 Take 1 tablet (100 mg total) by mouth daily.   Stephen Hall FlexTouch 200 UNIT/ML FlexTouch Pen Generic drug: insulin degludec Inject 180 Units into the skin daily.        Allergies: No Known Allergies  Past Medical History:  Diagnosis Date   Asthma    Diabetes mellitus without complication (HCC)    Environmental allergies    Hyperlipidemia    Hypertension     No past surgical history  on file.  Family History  Problem Relation Age of Onset   Alcohol abuse Other    Arthritis Other    Hypertension Other    Diabetes Other    Obesity Mother    Hypertension Mother    Obesity Father    Hypertension Father    Cancer Neg Hx    Heart disease Neg Hx    Stroke Neg Hx    Kidney disease Neg Hx     Social History:  reports that he has never smoked. He has never used smokeless tobacco. He reports that he does not drink alcohol and does not use drugs.  Review of Systems:  Last diabetic eye exam date 5/23  Last foot exam date: 7/23  Symptoms of neuropathy: None  Hypertension:   Treatment includes candesartan prescribed by PCP  BP Readings from Last 3 Encounters:  01/31/23 130/82  10/24/22 110/62  07/12/22 118/74    Lipid management: Previously on Crestor 10 mg daily He has been given Nexlizet by his PCP also with following results He has low HDL    Lab Results  Component Value Date   CHOL 111 03/07/2022   CHOL 214 (H) 12/26/2021   CHOL 143 10/18/2020   Lab Results  Component Value Date   HDL 31.10 (L) 03/07/2022   HDL 34.70 (L) 12/26/2021   HDL 34.40 (L) 10/18/2020   Lab Results  Component Value Date   LDLCALC 50 03/07/2022   LDLCALC 152 (H) 12/26/2021   LDLCALC 90 10/18/2020   Lab Results  Component Value Date   TRIG 147.0 03/07/2022   TRIG 135.0 12/26/2021   TRIG 93.0 10/18/2020   Lab Results  Component Value Date   CHOLHDL 4 03/07/2022   CHOLHDL 6 12/26/2021   CHOLHDL 4  10/18/2020   Lab Results  Component Value Date   LDLDIRECT 158.1 02/07/2012     Examination:   BP 130/82   Pulse (!) 107   Ht 6\' 5"  (1.956 m)   Wt (!) 338 lb 9.6 oz (153.6 kg)   SpO2 97%   BMI 40.15 kg/m   Body mass index is 40.15 kg/m.    ASSESSMENT/ PLAN:    Diabetes type 2 with severe obesity:    Current regimen: Mounjaro 10 mg weekly, Farxiga, basal bolus insulin  See history of present illness for detailed discussion of current diabetes management, blood sugar patterns and problems identified  A1c is 8.6  He still requiring large amounts of insulin especially basal Although his blood sugars are improving especially recently he still has periodic and significant hyperglycemia after meals This is related to changes in diet, sweet drinks and late timing of mealtime insulin Weight is improved finally with increasing Mounjaro to 10 mg However likely can do better with exercise  Severe obesity: Difficult to get his weight down especially with large doses of insulin being required and currently is improving diet and finally starting to lose with [facility] However reminded him that he should call and make an appointment with the bariatric center for potential bariatric surgery as discussed before  HYPERTENSION: Currently well-controlled Microalbumin normal   RECOMMENDATIONS:  He will try to continue to be consistent with diet including portions and carbohydrates and avoiding sweet drinks and sweets MOUNJARO will be continued at 10 mg weekly Discussed importance of taking NovoLog but not longer than 15 minutes instead of 30 minutes Also need to adjust the dose based on portions and carbohydrates No change in [place] unless his morning sugars start  getting lower below 90  Continue Farxiga regularly also Follow-up in 3 months   Patient Instructions  Novolog just before meals, adjust dose based on meal size and Carbs  Avoid sweet tea Total visit time including  counseling = 30 minutes  Stephen Hall 01/31/2023, 3:17 PM

## 2023-01-31 NOTE — Patient Instructions (Signed)
Novolog just before meals, adjust dose based on meal size and Carbs  Avoid sweet tea

## 2023-02-01 ENCOUNTER — Other Ambulatory Visit (HOSPITAL_COMMUNITY): Payer: Self-pay

## 2023-02-06 ENCOUNTER — Encounter: Payer: Self-pay | Admitting: Endocrinology

## 2023-02-08 ENCOUNTER — Other Ambulatory Visit (HOSPITAL_COMMUNITY): Payer: Self-pay

## 2023-02-13 ENCOUNTER — Ambulatory Visit: Payer: BC Managed Care – PPO | Admitting: Internal Medicine

## 2023-02-13 ENCOUNTER — Encounter: Payer: Self-pay | Admitting: Internal Medicine

## 2023-02-13 VITALS — BP 130/78 | HR 98 | Temp 98.1°F | Resp 16 | Ht 77.0 in | Wt 336.0 lb

## 2023-02-13 DIAGNOSIS — Z0001 Encounter for general adult medical examination with abnormal findings: Secondary | ICD-10-CM | POA: Diagnosis not present

## 2023-02-13 DIAGNOSIS — Z794 Long term (current) use of insulin: Secondary | ICD-10-CM

## 2023-02-13 DIAGNOSIS — I10 Essential (primary) hypertension: Secondary | ICD-10-CM

## 2023-02-13 DIAGNOSIS — E1165 Type 2 diabetes mellitus with hyperglycemia: Secondary | ICD-10-CM

## 2023-02-13 DIAGNOSIS — E119 Type 2 diabetes mellitus without complications: Secondary | ICD-10-CM

## 2023-02-13 DIAGNOSIS — E785 Hyperlipidemia, unspecified: Secondary | ICD-10-CM | POA: Diagnosis not present

## 2023-02-13 DIAGNOSIS — R809 Proteinuria, unspecified: Secondary | ICD-10-CM

## 2023-02-13 DIAGNOSIS — E1129 Type 2 diabetes mellitus with other diabetic kidney complication: Secondary | ICD-10-CM | POA: Diagnosis not present

## 2023-02-13 LAB — BASIC METABOLIC PANEL
BUN: 13 mg/dL (ref 6–23)
CO2: 26 mEq/L (ref 19–32)
Calcium: 9.7 mg/dL (ref 8.4–10.5)
Chloride: 101 mEq/L (ref 96–112)
Creatinine, Ser: 0.99 mg/dL (ref 0.40–1.50)
GFR: 95.07 mL/min (ref >60.00)
Glucose, Bld: 62 mg/dL — ABNORMAL LOW (ref 70–99)
Potassium: 3.9 mEq/L (ref 3.5–5.1)
Sodium: 137 mEq/L (ref 135–145)

## 2023-02-13 LAB — CBC WITH DIFFERENTIAL/PLATELET
Basophils Absolute: 0.1 10*3/uL (ref 0.0–0.1)
Basophils Relative: 0.8 % (ref 0.0–3.0)
Eosinophils Absolute: 0.2 10*3/uL (ref 0.0–0.7)
Eosinophils Relative: 1.7 % (ref 0.0–5.0)
HCT: 43.8 % (ref 39.0–52.0)
Hemoglobin: 14 g/dL (ref 13.0–17.0)
Lymphocytes Relative: 34.9 % (ref 12.0–46.0)
Lymphs Abs: 3.7 10*3/uL (ref 0.7–4.0)
MCHC: 32 g/dL (ref 30.0–36.0)
MCV: 77 fl — ABNORMAL LOW (ref 78.0–100.0)
Monocytes Absolute: 1 10*3/uL (ref 0.1–1.0)
Monocytes Relative: 9.3 % (ref 3.0–12.0)
Neutro Abs: 5.6 10*3/uL (ref 1.4–7.7)
Neutrophils Relative %: 53.3 % (ref 43.0–77.0)
Platelets: 369 10*3/uL (ref 150.0–400.0)
RBC: 5.68 Mil/uL (ref 4.22–5.81)
RDW: 16.1 % — ABNORMAL HIGH (ref 11.5–15.5)
WBC: 10.5 10*3/uL (ref 4.0–10.5)

## 2023-02-13 LAB — TSH: TSH: 1.86 u[IU]/mL (ref 0.35–5.50)

## 2023-02-13 LAB — HEPATIC FUNCTION PANEL
ALT: 19 U/L (ref 0–53)
AST: 19 U/L (ref 0–37)
Albumin: 4.5 g/dL (ref 3.5–5.2)
Alkaline Phosphatase: 76 U/L (ref 39–117)
Bilirubin, Direct: 0.1 mg/dL (ref 0.0–0.3)
Total Bilirubin: 0.4 mg/dL (ref 0.2–1.2)
Total Protein: 7.6 g/dL (ref 6.0–8.3)

## 2023-02-13 LAB — LIPID PANEL
Cholesterol: 128 mg/dL (ref 0–200)
HDL: 31.6 mg/dL — ABNORMAL LOW (ref >39.00)
LDL Cholesterol: 78 mg/dL (ref 0–99)
NonHDL: 96.46
Total CHOL/HDL Ratio: 4
Triglycerides: 92 mg/dL (ref 0.0–149.0)
VLDL: 18.4 mg/dL (ref 0.0–40.0)

## 2023-02-13 LAB — PSA: PSA: 0.71 ng/mL (ref 0.10–4.00)

## 2023-02-13 LAB — HEMOGLOBIN A1C: Hgb A1c MFr Bld: 8.4 % — ABNORMAL HIGH (ref 4.6–6.5)

## 2023-02-13 MED ORDER — GVOKE HYPOPEN 2-PACK 1 MG/0.2ML ~~LOC~~ SOAJ
1.0000 | Freq: Every day | SUBCUTANEOUS | 5 refills | Status: AC | PRN
Start: 2023-02-13 — End: ?
  Filled 2023-02-13: qty 0.4, 30d supply, fill #0

## 2023-02-13 NOTE — Progress Notes (Signed)
Subjective:  Patient ID: Stephen Hall, male    DOB: December 19, 1980  Age: 42 y.o. MRN: [id number]  CC: Annual Exam, Hypertension, Diabetes, and Hyperlipidemia   HPI Stephen Hall presents for a CPX and f/up ----  Discussed the use of AI scribe software for clinical note transcription with the patient, who gave verbal consent to proceed.  History of Present Illness   The patient, with a history of diabetes, reports an improvement in their condition, with a significant decrease in their blood glucose levels over the past few months. They attribute this improvement to the use of Mounjaro. They report a reduction in diabetes-related symptoms such as excessive thirst and urination, particularly nocturia. However, they have experienced episodes of hypoglycemia, with blood glucose levels dropping to the 60s, particularly when they delay eating after taking NovoLog.  They have had a recent eye exam and report no issues with their feet, although they express concern about potential swelling. They deny any chest pain or shortness of breath during physical activity, which includes walking. They report no muscle or joint aches, no abdominal discomfort, and no issues with urine flow. They deny any presence of blood in their stool.  The patient also mentions a recent weight loss of three pounds within the last week, which they attribute to the appetite-suppressing effects of Mounjaro. They express a desire to have their cholesterol levels checked, hoping for a decrease.  In addition to diabetes, the patient is under the care of a urologist for an unspecified condition. They also express an interest in participating in outdoor activities such as camping and hiking.       Outpatient Medications Prior to Visit  Medication Sig Dispense Refill   albuterol (VENTOLIN HFA) 108 (90 Base) MCG/ACT inhaler Inhale 2 puffs into the lungs every 6 (six) hours as needed for wheezing or shortness of breath. 7.5 g 0    Bempedoic Acid-Ezetimibe (NEXLIZET) 180-10 MG TABS Take 1 tablet by mouth daily. 90 tablet 1   Blood Glucose Monitoring Suppl (ONETOUCH VERIO FLEX SYSTEM) w/Device KIT 1 Act by Does not apply route 3 (three) times daily. 2 kit 0   celecoxib (CELEBREX) 200 MG capsule Take 1 capsule by mouth 2 times daily as needed for pain 30 capsule 1   Cholecalciferol 50 MCG (2000 UT) TABS Take 1 tablet (2,000 Units total) by mouth daily. 90 tablet 3   Continuous Blood Gluc Receiver (DEXCOM G6 RECEIVER) DEVI 1 Act by Does not apply route daily. 1 each 5   Continuous Blood Gluc Transmit (DEXCOM G6 TRANSMITTER) MISC Use as directed 1 each 5   Continuous Glucose Sensor (DEXCOM G7 SENSOR) MISC Change sensor every 10 days 3 each 3   Dapagliflozin Pro-metFORMIN ER (XIGDUO XR) 04-999 MG TB24 Take 1 tablet by mouth daily. 90 tablet 1   HYDROcodone-acetaminophen (NORCO) 7.5-325 MG tablet Take 1 tablet by mouth every 4-6 hours as needed for pain. 12 tablet 0   insulin aspart (NOVOLOG FLEXPEN) 100 UNIT/ML FlexPen Inject 30 Units into the skin 3 (three) times daily with meals. 15 mL 11   insulin degludec (TRESIBA FLEXTOUCH) 200 UNIT/ML FlexTouch Pen Inject 180 Units into the skin daily. 81 mL 3   Insulin Pen Needle (BD PEN NEEDLE MICRO U/F) 32G X 6 MM MISC Use as directed morning, noon, in the evening and at bedtime. 300 each 1   Lancets (ONETOUCH DELICA PLUS LANCET33G) MISC USE TO CHECK BLOOD GLUCOSE THREE TIMES DAILY 100 each 1   naproxen sodium (ALEVE)  220 MG tablet Take 2 tablets by mouth for the first dose, then take 1 tablet by mouth every 8-12 hours. 24 tablet 0   rosuvastatin (CRESTOR) 10 MG tablet Take 1 tablet (10 mg total) by mouth daily. 90 tablet 1   tadalafil (CIALIS) 5 MG tablet Take 1 tablet by mouth daily. 90 tablet 3   thiamine (VITAMIN B1) 100 MG tablet Take 1 tablet (100 mg total) by mouth daily. 90 tablet 1   tirzepatide (MOUNJARO) 10 MG/0.5ML Pen Inject 10 mg into the skin once a week. 3 mL 2    tirzepatide (MOUNJARO) 12.5 MG/0.5ML Pen Inject 12.5 mg (0.5 ml) into the skin once a week. 6 mL 1   Candesartan Cilexetil-HCTZ 32-25 MG TABS Take 1 tablet by mouth daily. 90 tablet 0   No facility-administered medications prior to visit.    ROS Review of Systems  Constitutional: Negative.  Negative for appetite change, diaphoresis, fatigue and unexpected weight change.  HENT: Negative.    Eyes: Negative.   Respiratory:  Negative for chest tightness, shortness of breath and wheezing.   Cardiovascular:  Negative for chest pain, palpitations and leg swelling.  Gastrointestinal:  Negative for abdominal pain, constipation, diarrhea, nausea and vomiting.  Endocrine: Negative.   Genitourinary: Negative.  Negative for difficulty urinating.  Musculoskeletal: Negative.  Negative for arthralgias, joint swelling and myalgias.  Skin: Negative.   Neurological: Negative.  Negative for dizziness and weakness.  Hematological:  Negative for adenopathy. Does not bruise/bleed easily.  Psychiatric/Behavioral: Negative.      Objective:  BP 130/78 (BP Location: Left Arm, Patient Position: Sitting, Cuff Size: Large)   Pulse 98   Temp 98.1 F (36.7 C) (Oral)   Resp 16   Ht 6\' 5"  (1.956 m)   Wt (!) 336 lb (152.4 kg)   SpO2 98%   BMI 39.84 kg/m   BP Readings from Last 3 Encounters:  02/13/23 130/78  01/31/23 130/82  10/24/22 110/62    Wt Readings from Last 3 Encounters:  02/13/23 (!) 336 lb (152.4 kg)  01/31/23 (!) 338 lb 9.6 oz (153.6 kg)  10/24/22 (!) 348 lb (157.9 kg)    Physical Exam Vitals reviewed.  Constitutional:      Appearance: Normal appearance.  HENT:     Nose: Nose normal.     Mouth/Throat:     Mouth: Mucous membranes are moist.  Eyes:     General: No scleral icterus.    Conjunctiva/sclera: Conjunctivae normal.  Cardiovascular:     Rate and Rhythm: Normal rate and regular rhythm.     Heart sounds: No murmur heard. Pulmonary:     Effort: Pulmonary effort is normal.      Breath sounds: No stridor. No wheezing, rhonchi or rales.  Abdominal:     General: Abdomen is flat.     Palpations: There is no mass.     Tenderness: There is no abdominal tenderness. There is no guarding.     Hernia: No hernia is present.  Musculoskeletal:        General: Normal range of motion.     Cervical back: Neck supple.     Right lower leg: No edema.     Left lower leg: No edema.  Skin:    General: Skin is warm and dry.     Coloration: Skin is not pale.  Neurological:     General: No focal deficit present.     Mental Status: He is alert and oriented to person, place, and time. Mental  status is at baseline.  Psychiatric:        Mood and Affect: Mood normal.        Behavior: Behavior normal.     Lab Results  Component Value Date   WBC 10.5 02/13/2023   HGB 14.0 02/13/2023   HCT 43.8 02/13/2023   PLT 369.0 02/13/2023   GLUCOSE 62 (L) 02/13/2023   CHOL 128 02/13/2023   TRIG 92.0 02/13/2023   HDL 31.60 (L) 02/13/2023   LDLDIRECT 158.1 02/07/2012   LDLCALC 78 02/13/2023   ALT 19 02/13/2023   AST 19 02/13/2023   NA 137 02/13/2023   K 3.9 02/13/2023   CL 101 02/13/2023   CREATININE 0.99 02/13/2023   BUN 13 02/13/2023   CO2 26 02/13/2023   TSH 1.86 02/13/2023   PSA 0.71 02/13/2023   HGBA1C 8.4 (H) 02/13/2023   MICROALBUR <0.7 01/29/2023    No results found.  Assessment & Plan:   Hyperlipidemia with target LDL less than 130 - LDL goal achieved. Doing well on the statin  -     Lipid panel; Future -     TSH; Future -     Hepatic function panel; Future  Type 2 diabetes mellitus with microalbuminuria, without long-term current use of insulin (HCC) -     HM Diabetes Foot Exam  Encounter for general adult medical examination with abnormal findings- Exam completed, labs reviewed, vaccines reviewed and updated, cancer screenings addressed, pt ed material was given.  -     PSA; Future  Insulin-requiring or dependent type II diabetes mellitus (HCC) -     Gvoke  HypoPen 2-Pack; Inject 1 pen into the skin daily as needed for low blood sugar  Dispense: 0.4 mL; Refill: 5  Essential hypertension- His BP is well controlled. -     CBC with Differential/Platelet; Future -     TSH; Future  Uncontrolled type 2 diabetes mellitus with hyperglycemia, with long-term current use of insulin (HCC) -     Basic metabolic panel -     Hemoglobin A1c     Follow-up: Return in about 6 months (around 08/16/2023).  Sanda Linger, MD

## 2023-02-13 NOTE — Patient Instructions (Signed)
Health Maintenance, Male Adopting a healthy lifestyle and getting preventive care are important in promoting health and wellness. Ask your health care provider about: The right schedule for you to have regular tests and exams. Things you can do on your own to prevent diseases and keep yourself healthy. What should I know about diet, weight, and exercise? Eat a healthy diet  Eat a diet that includes plenty of vegetables, fruits, low-fat dairy products, and lean protein. Do not eat a lot of foods that are high in solid fats, added sugars, or sodium. Maintain a healthy weight Body mass index (BMI) is a measurement that can be used to identify possible weight problems. It estimates body fat based on height and weight. Your health care provider can help determine your BMI and help you achieve or maintain a healthy weight. Get regular exercise Get regular exercise. This is one of the most important things you can do for your health. Most adults should: Exercise for at least 150 minutes each week. The exercise should increase your heart rate and make you sweat (moderate-intensity exercise). Do strengthening exercises at least twice a week. This is in addition to the moderate-intensity exercise. Spend less time sitting. Even light physical activity can be beneficial. Watch cholesterol and blood lipids Have your blood tested for lipids and cholesterol at 41 years of age, then have this test every 5 years. You may need to have your cholesterol levels checked more often if: Your lipid or cholesterol levels are high. You are older than 40 years of age. You are at high risk for heart disease. What should I know about cancer screening? Many types of cancers can be detected early and may often be prevented. Depending on your health history and family history, you may need to have cancer screening at various ages. This may include screening for: Colorectal cancer. Prostate cancer. Skin cancer. Lung  cancer. What should I know about heart disease, diabetes, and high blood pressure? Blood pressure and heart disease High blood pressure causes heart disease and increases the risk of stroke. This is more likely to develop in people who have high blood pressure readings or are overweight. Talk with your health care provider about your target blood pressure readings. Have your blood pressure checked: Every 3-5 years if you are 18-39 years of age. Every year if you are 40 years old or older. If you are between the ages of 65 and 75 and are a current or former smoker, ask your health care provider if you should have a one-time screening for abdominal aortic aneurysm (AAA). Diabetes Have regular diabetes screenings. This checks your fasting blood sugar level. Have the screening done: Once every three years after age 45 if you are at a normal weight and have a low risk for diabetes. More often and at a younger age if you are overweight or have a high risk for diabetes. What should I know about preventing infection? Hepatitis B If you have a higher risk for hepatitis B, you should be screened for this virus. Talk with your health care provider to find out if you are at risk for hepatitis B infection. Hepatitis C Blood testing is recommended for: Everyone born from 1945 through 1965. Anyone with known risk factors for hepatitis C. Sexually transmitted infections (STIs) You should be screened each year for STIs, including gonorrhea and chlamydia, if: You are sexually active and are younger than 41 years of age. You are older than 41 years of age and your   health care provider tells you that you are at risk for this type of infection. Your sexual activity has changed since you were last screened, and you are at increased risk for chlamydia or gonorrhea. Ask your health care provider if you are at risk. Ask your health care provider about whether you are at high risk for HIV. Your health care provider  may recommend a prescription medicine to help prevent HIV infection. If you choose to take medicine to prevent HIV, you should first get tested for HIV. You should then be tested every 3 months for as long as you are taking the medicine. Follow these instructions at home: Alcohol use Do not drink alcohol if your health care provider tells you not to drink. If you drink alcohol: Limit how much you have to 0-2 drinks a day. Know how much alcohol is in your drink. In the [place] one drink equals one 12 oz bottle of beer (355 mL), one 5 oz glass of wine (148 mL), or one 1 oz glass of hard liquor (44 mL). Lifestyle Do not use any products that contain nicotine or tobacco. These products include cigarettes, chewing tobacco, and vaping devices, such as e-cigarettes. If you need help quitting, ask your health care provider. Do not use street drugs. Do not share needles. Ask your health care provider for help if you need support or information about quitting drugs. General instructions Schedule regular health, dental, and eye exams. Stay current with your vaccines. Tell your health care provider if: You often feel depressed. You have ever been abused or do not feel safe at home. Summary Adopting a healthy lifestyle and getting preventive care are important in promoting health and wellness. Follow your health care provider's instructions about healthy diet, exercising, and getting tested or screened for diseases. Follow your health care provider's instructions on monitoring your cholesterol and blood pressure. This information is not intended to replace advice given to you by your health care provider. Make sure you discuss any questions you have with your health care provider. Document Revised: 11/07/2020 Document Reviewed: 11/07/2020 Elsevier Patient Education  2024 [employer]  

## 2023-02-14 ENCOUNTER — Other Ambulatory Visit: Payer: Self-pay

## 2023-02-14 ENCOUNTER — Other Ambulatory Visit (HOSPITAL_COMMUNITY): Payer: Self-pay

## 2023-02-25 ENCOUNTER — Other Ambulatory Visit (HOSPITAL_COMMUNITY): Payer: Self-pay

## 2023-03-08 ENCOUNTER — Other Ambulatory Visit: Payer: Self-pay

## 2023-03-08 ENCOUNTER — Other Ambulatory Visit: Payer: Self-pay | Admitting: Internal Medicine

## 2023-03-08 ENCOUNTER — Other Ambulatory Visit (HOSPITAL_COMMUNITY): Payer: Self-pay

## 2023-03-08 DIAGNOSIS — E1169 Type 2 diabetes mellitus with other specified complication: Secondary | ICD-10-CM

## 2023-03-08 DIAGNOSIS — I10 Essential (primary) hypertension: Secondary | ICD-10-CM

## 2023-03-08 MED ORDER — CANDESARTAN CILEXETIL-HCTZ 32-25 MG PO TABS
1.0000 | ORAL_TABLET | Freq: Every day | ORAL | 0 refills | Status: DC
  Filled 2023-03-08: qty 90, 90d supply, fill #0

## 2023-03-08 MED ORDER — TADALAFIL 5 MG PO TABS
5.0000 mg | ORAL_TABLET | Freq: Every day | ORAL | 3 refills | Status: AC
Start: 2023-03-08 — End: ?
  Filled 2023-03-08: qty 90, 90d supply, fill #0
  Filled 2023-08-22: qty 90, 90d supply, fill #1
  Filled 2024-02-10: qty 90, 90d supply, fill #2

## 2023-03-11 ENCOUNTER — Other Ambulatory Visit (HOSPITAL_COMMUNITY): Payer: Self-pay

## 2023-04-26 ENCOUNTER — Other Ambulatory Visit (HOSPITAL_COMMUNITY): Payer: Self-pay

## 2023-04-27 ENCOUNTER — Other Ambulatory Visit (HOSPITAL_COMMUNITY): Payer: Self-pay

## 2023-05-10 ENCOUNTER — Ambulatory Visit: Payer: BC Managed Care – PPO | Admitting: Endocrinology

## 2023-06-21 ENCOUNTER — Other Ambulatory Visit (HOSPITAL_COMMUNITY): Payer: Self-pay

## 2023-06-21 ENCOUNTER — Other Ambulatory Visit: Payer: Self-pay

## 2023-06-25 ENCOUNTER — Other Ambulatory Visit (HOSPITAL_COMMUNITY): Payer: Self-pay

## 2023-06-25 ENCOUNTER — Other Ambulatory Visit: Payer: Self-pay | Admitting: Endocrinology

## 2023-06-27 ENCOUNTER — Other Ambulatory Visit: Payer: Self-pay

## 2023-06-27 ENCOUNTER — Other Ambulatory Visit (HOSPITAL_COMMUNITY): Payer: Self-pay

## 2023-06-27 ENCOUNTER — Telehealth: Payer: Self-pay | Admitting: Endocrinology

## 2023-06-27 DIAGNOSIS — E1165 Type 2 diabetes mellitus with hyperglycemia: Secondary | ICD-10-CM

## 2023-06-27 MED ORDER — NOVOLOG FLEXPEN 100 UNIT/ML ~~LOC~~ SOPN
30.0000 [IU] | PEN_INJECTOR | Freq: Three times a day (TID) | SUBCUTANEOUS | 11 refills | Status: DC
  Filled 2023-06-27 – 2023-07-06 (×2): qty 15, 16d supply, fill #0

## 2023-06-27 MED ORDER — DEXCOM G7 SENSOR MISC
1.0000 | 3 refills | Status: DC
  Filled 2023-06-27 – 2023-07-06 (×2): qty 3, 30d supply, fill #0
  Filled 2023-08-22: qty 3, 30d supply, fill #1
  Filled 2023-09-30: qty 3, 30d supply, fill #2
  Filled 2023-11-15: qty 3, 30d supply, fill #3

## 2023-06-27 NOTE — Telephone Encounter (Signed)
Please sen a refill into Novolog and Dexcom to [facility]. Patient advising that he has been waiting on a refill x 1 month without any resolution. Please advise patient when done.

## 2023-06-28 ENCOUNTER — Other Ambulatory Visit (HOSPITAL_COMMUNITY): Payer: Self-pay

## 2023-07-06 ENCOUNTER — Other Ambulatory Visit (HOSPITAL_COMMUNITY): Payer: Self-pay

## 2023-07-06 ENCOUNTER — Other Ambulatory Visit: Payer: Self-pay | Admitting: Endocrinology

## 2023-07-06 DIAGNOSIS — E1165 Type 2 diabetes mellitus with hyperglycemia: Secondary | ICD-10-CM

## 2023-07-10 DIAGNOSIS — E1169 Type 2 diabetes mellitus with other specified complication: Secondary | ICD-10-CM | POA: Insufficient documentation

## 2023-07-10 DIAGNOSIS — N486 Induration penis plastica: Secondary | ICD-10-CM | POA: Insufficient documentation

## 2023-07-19 ENCOUNTER — Encounter: Payer: Self-pay | Admitting: Endocrinology

## 2023-07-19 ENCOUNTER — Other Ambulatory Visit (HOSPITAL_COMMUNITY): Payer: Self-pay

## 2023-07-19 ENCOUNTER — Ambulatory Visit (INDEPENDENT_AMBULATORY_CARE_PROVIDER_SITE_OTHER): Payer: 59 | Admitting: Endocrinology

## 2023-07-19 VITALS — BP 142/70 | HR 80 | Resp 20 | Ht 77.0 in | Wt 344.2 lb

## 2023-07-19 DIAGNOSIS — E1165 Type 2 diabetes mellitus with hyperglycemia: Secondary | ICD-10-CM | POA: Diagnosis not present

## 2023-07-19 DIAGNOSIS — Z794 Long term (current) use of insulin: Secondary | ICD-10-CM | POA: Diagnosis not present

## 2023-07-19 LAB — POCT GLYCOSYLATED HEMOGLOBIN (HGB A1C): Hemoglobin A1C: 9.6 % — AB (ref 4.0–5.6)

## 2023-07-19 MED ORDER — NOVOLOG FLEXPEN 100 UNIT/ML ~~LOC~~ SOPN
30.0000 [IU] | PEN_INJECTOR | Freq: Three times a day (TID) | SUBCUTANEOUS | 4 refills | Status: DC
  Filled 2023-07-19: qty 45, 50d supply, fill #0
  Filled 2023-08-22: qty 45, 48d supply, fill #0

## 2023-07-19 MED ORDER — BD PEN NEEDLE MICRO U/F 32G X 6 MM MISC
1.0000 | Freq: Four times a day (QID) | 3 refills | Status: DC
  Filled 2023-07-19: qty 300, 75d supply, fill #0

## 2023-07-19 MED ORDER — DAPAGLIFLOZIN PRO-METFORMIN ER 10-1000 MG PO TB24
1.0000 | ORAL_TABLET | Freq: Every day | ORAL | 3 refills | Status: DC
  Filled 2023-07-19 – 2024-02-10 (×3): qty 90, 90d supply, fill #0

## 2023-07-19 MED ORDER — TIRZEPATIDE 10 MG/0.5ML ~~LOC~~ SOAJ
10.0000 mg | SUBCUTANEOUS | 3 refills | Status: DC
  Filled 2023-07-19: qty 4, 56d supply, fill #0
  Filled 2023-08-22: qty 6, 84d supply, fill #0
  Filled 2023-12-19: qty 6, 84d supply, fill #1
  Filled 2024-03-04: qty 6, 84d supply, fill #2
  Filled 2024-06-03: qty 2, 28d supply, fill #3

## 2023-07-19 MED ORDER — TRESIBA FLEXTOUCH 200 UNIT/ML ~~LOC~~ SOPN
160.0000 [IU] | PEN_INJECTOR | Freq: Every day | SUBCUTANEOUS | 3 refills | Status: DC
  Filled 2023-07-19 – 2023-08-22 (×2): qty 72, 90d supply, fill #0
  Filled 2024-04-29: qty 72, 90d supply, fill #1

## 2023-07-19 NOTE — Progress Notes (Signed)
Outpatient Endocrinology Note [place] Esmee Fallaw, MD   Patient's Name: Stephen Hall    DOB: Nov 14, 1980    MRN: [id number]                                                    REASON OF VISIT: Follow up for type 2 diabetes mellitus  PCP: Etta Grandchild, MD  HISTORY OF PRESENT ILLNESS:   Stephen Hall is a 43 y.o. old male with past medical history listed below, is here for follow up for type 2 diabetes mellitus.   Pertinent Diabetes History: Patient was previously seen by Dr. Lucianne Muss and was last time seen in August 2024.  Patient was diagnosed with type 2 diabetes mellitus in 2015.  Insulin therapy was started in 2017.  He has uncontrolled type 2 diabetes mellitus.  No personal history of pancreatitis and / or family history of medullary thyroid carcinoma or MEN 2B syndrome.   Chronic Diabetes Complications : Retinopathy: no. Last ophthalmology exam was done on 12/2022, following with ophthalmology regularly.  Nephropathy: no, on ACE/ARB /candesartan Peripheral neuropathy: no Coronary artery disease: no Stroke: no  Relevant comorbidities and cardiovascular risk factors: Obesity: yes Body mass index is 40.82 kg/m.  Hypertension: Yes  Hyperlipidemia : Yes, on statin   Current / Home Diabetic regimen includes:  Non-insulin hypoglycemic drugs: Mounjaro 10 mg weekly, Xigduo 10/998 mg 1 tablet daily     Insulin regimen: 180 units of U-200 Tresiba daily, NovoLog 30 units before meals , taking only with breakfast.  Prior diabetic medications: Soliqua, Lantus insulin, Humalog , Prandin,? Metformin.  GI upset with higher dose of Mounjaro.  Glycemic data:    CONTINUOUS GLUCOSE MONITORING SYSTEM (CGMS) INTERPRETATION: At today's visit, we reviewed CGM downloads. The full report is scanned in the media. Reviewing the CGM trends, blood glucose are as follows:  Dexcom G7 CGM-  Sensor Download (Sensor download was reviewed and summarized below.) Dates: January 2 to July 17, 2023, 14  days  Glucose Management Indicator: 8.5%    Interpretation: -Mostly hyperglycemia in the range of 200-300s.  Especially with supper and bedtime and hyperglycemia extended up to overnight.  Blood sugar trending down by the morning.  He has significant hyperglycemia with breakfast.  Blood sugar in the afternoon variable occasional hyperglycemia and sometimes trending down blood sugar.  No hypoglycemia.  He takes NovoLog only with breakfast, does not take NovoLog with supper.   Hypoglycemia: Patient has no hypoglycemic episodes. Patient has hypoglycemia awareness.  Factors modifying glucose control: 1.  Diabetic diet assessment: 3 meals a day.  Drinking regular sodas.  Frequent fast food.  2.  Staying active or exercising: No formal exercise.  3.  Medication compliance: compliant most of the time.  Interval history  Diabetes regimen as reviewed and noted above.  He has been only taking NovoLog with breakfast and takes after eating.  He does not take NovoLog with lunch or supper.  He has been taking Tresiba 180 units in the morning daily.  Reports compliance with Greggory Keen and Xigduo.  He reports nausea and abdominal fullness on the day and 1 day after taking Mounjaro.  No vomiting.  CGM data as reviewed above.  Denies numbness and tingling of the feet.  No vision problem.  No other complaints today.  A1c 9.6 today.  REVIEW OF SYSTEMS As per  history of present illness.   PAST MEDICAL HISTORY: Past Medical History:  Diagnosis Date   Asthma    Diabetes mellitus without complication (HCC)    Environmental allergies    Hyperlipidemia    Hypertension     PAST SURGICAL HISTORY: History reviewed. No pertinent surgical history.  ALLERGIES: No Known Allergies  FAMILY HISTORY:  Family History  Problem Relation Age of Onset   Alcohol abuse Other    Arthritis Other    Hypertension Other    Diabetes Other    Obesity Mother    Hypertension Mother    Obesity Father    Hypertension  Father    Cancer Neg Hx    Heart disease Neg Hx    Stroke Neg Hx    Kidney disease Neg Hx     SOCIAL HISTORY: Social History   Socioeconomic History   Marital status: Married    Spouse name: Not on file   Number of children: Not on file   Years of education: Not on file   Highest education level: Not on file  Occupational History   Not on file  Tobacco Use   Smoking status: Never   Smokeless tobacco: Never  Substance and Sexual Activity   Alcohol use: No   Drug use: No   Sexual activity: Yes  Other Topics Concern   Not on file  Social History Narrative   Caffienated drinks-yes   Seat belt use often-yes   Regular Exercise-no   Smoke alarm in the home-yes   Firearms/guns in the home-no   History of physical abuse-no               Social Drivers of Corporate investment banker Strain: Not on file  Food Insecurity: Not on file  Transportation Needs: Not on file  Physical Activity: Not on file  Stress: Not on file  Social Connections: Not on file    MEDICATIONS:  Current Outpatient Medications  Medication Sig Dispense Refill   albuterol (VENTOLIN HFA) 108 (90 Base) MCG/ACT inhaler Inhale 2 puffs into the lungs every 6 (six) hours as needed for wheezing or shortness of breath. 7.5 g 0   Bempedoic Acid-Ezetimibe (NEXLIZET) 180-10 MG TABS Take 1 tablet by mouth daily. 90 tablet 1   Blood Glucose Monitoring Suppl (ONETOUCH VERIO FLEX SYSTEM) w/Device KIT 1 Act by Does not apply route 3 (three) times daily. 2 kit 0   celecoxib (CELEBREX) 200 MG capsule Take 1 capsule by mouth 2 times daily as needed for pain 30 capsule 1   Cholecalciferol 50 MCG (2000 UT) TABS Take 1 tablet (2,000 Units total) by mouth daily. 90 tablet 3   Continuous Blood Gluc Receiver (DEXCOM G6 RECEIVER) DEVI 1 Act by Does not apply route daily. 1 each 5   Continuous Blood Gluc Transmit (DEXCOM G6 TRANSMITTER) MISC Use as directed 1 each 5   Continuous Glucose Sensor (DEXCOM G7 SENSOR) MISC Change  sensor every 10 days 3 each 3   Glucagon (GVOKE HYPOPEN 2-PACK) 1 MG/0.2ML SOAJ Inject 1 pen into the skin daily as needed for low blood sugar 0.4 mL 5   HYDROcodone-acetaminophen (NORCO) 7.5-325 MG tablet Take 1 tablet by mouth every 4-6 hours as needed for pain. 12 tablet 0   Lancets (ONETOUCH DELICA PLUS LANCET33G) MISC USE TO CHECK BLOOD GLUCOSE THREE TIMES DAILY 100 each 1   naproxen sodium (ALEVE) 220 MG tablet Take 2 tablets by mouth for the first dose, then take 1 tablet by mouth  every 8-12 hours. 24 tablet 0   rosuvastatin (CRESTOR) 10 MG tablet Take 1 tablet (10 mg total) by mouth daily. 90 tablet 1   tadalafil (CIALIS) 5 MG tablet Take 1 tablet (5 mg total) by mouth daily. 90 tablet 3   thiamine (VITAMIN B1) 100 MG tablet Take 1 tablet (100 mg total) by mouth daily. 90 tablet 1   Candesartan Cilexetil-HCTZ 32-25 MG TABS Take 1 tablet by mouth daily. 90 tablet 0   Dapagliflozin Pro-metFORMIN ER (XIGDUO XR) 04-999 MG TB24 Take 1 tablet by mouth daily. 90 tablet 3   insulin aspart (NOVOLOG FLEXPEN) 100 UNIT/ML FlexPen Inject 30 Units into the skin 3 (three) times daily with meals. 45 mL 4   insulin degludec (TRESIBA FLEXTOUCH) 200 UNIT/ML FlexTouch Pen Inject 160 Units into the skin daily. 81 mL 3   Insulin Pen Needle (BD PEN NEEDLE MICRO U/F) 32G X 6 MM MISC Use as directed morning, noon, in the evening and at bedtime. 300 each 3   tirzepatide (MOUNJARO) 10 MG/0.5ML Pen Inject 10 mg into the skin once a week. 9 mL 3   No current facility-administered medications for this visit.    PHYSICAL EXAM: Vitals:   07/19/23 0812 07/19/23 0813  BP: (!) 142/80 (!) 142/70  Pulse: 80   Resp: 20   SpO2: 96%   Weight: (!) 344 lb 3.2 oz (156.1 kg)   Height: 6\' 5"  (1.956 m)    Body mass index is 40.82 kg/m.  Wt Readings from Last 3 Encounters:  07/19/23 (!) 344 lb 3.2 oz (156.1 kg)  02/13/23 (!) 336 lb (152.4 kg)  01/31/23 (!) 338 lb 9.6 oz (153.6 kg)    General: Well developed, well  nourished male in no apparent distress.  HEENT: AT/[place], no external lesions.  Eyes: Conjunctiva clear and no icterus. Neck: Neck supple  Lungs: Respirations not labored Neurologic: Alert, oriented, normal speech Extremities / Skin: Dry. No sores or rashes noted. No acanthosis nigricans Psychiatric: Does not appear depressed or anxious  Diabetic Foot Exam - Simple   No data filed    LABS Reviewed Lab Results  Component Value Date   HGBA1C 9.6 (A) 07/19/2023   HGBA1C 8.4 (H) 02/13/2023   HGBA1C 8.6 (H) 01/29/2023   Lab Results  Component Value Date   FRUCTOSAMINE 316 (H) 03/07/2022   FRUCTOSAMINE 275 10/26/2020   FRUCTOSAMINE 209 07/23/2016   Lab Results  Component Value Date   CHOL 128 02/13/2023   HDL 31.60 (L) 02/13/2023   LDLCALC 78 02/13/2023   LDLDIRECT 158.1 02/07/2012   TRIG 92.0 02/13/2023   CHOLHDL 4 02/13/2023   Lab Results  Component Value Date   MICRALBCREAT 0.8 01/29/2023   MICRALBCREAT 1.7 12/26/2021   Lab Results  Component Value Date   CREATININE 0.99 02/13/2023   Lab Results  Component Value Date   GFR 95.07 02/13/2023    ASSESSMENT / PLAN  1. Uncontrolled type 2 diabetes mellitus with hyperglycemia, with long-term current use of insulin (HCC)   2. Type 2 diabetes mellitus with hyperglycemia, with long-term current use of insulin (HCC)   3. Morbid obesity (HCC)     Diabetes Mellitus type 2, complicated by no known complications. - Diabetic status / severity: Uncontrolled, worsening  Lab Results  Component Value Date   HGBA1C 9.6 (A) 07/19/2023    - Hemoglobin A1c goal : <6.5%  Discussed about importance of controlling diabetes to prevent chronic complications including diabetic retinopathy, neuropathy and nephropathy.  Discussed about compliance  with diabetes regimen.  Discussed about taking NovoLog with each meals breakfast, lunch and supper and asked to take 10 to 15 minutes before.  At this time he has not been taking NovoLog with  lunch and supper.  With taking NovoLog more often decrease the basal insulin as follows.  - Medications: See below  I) decrease Tresiba from 180 to 160 units daily. II) take NovoLog 30 units with meals 3 times a day, 10 to 15 minutes before eating. III) continue Mounjaro 10 mg weekly. IV) continue Xigduo 10/998 mg 1 tablet daily  - Home glucose testing: Dexcom G7 and check as needed. - Discussed/ Gave Hypoglycemia treatment plan.  # Consult : not required at this time.   # Annual urine for microalbuminuria/ creatinine ratio, no microalbuminuria currently, continue ACE/ARB /candesartan. Last  Lab Results  Component Value Date   MICRALBCREAT 0.8 01/29/2023    # Foot check nightly.  # Annual dilated diabetic eye exams.   - Diet: Make healthy diabetic food choices.  Discussed in detail.  Asked to avoid regular soft drinks.  Asked to limit carbohydrates and fast food. - Life style / activity / exercise: Discussed.  2. Blood pressure  -  BP Readings from Last 1 Encounters:  07/19/23 (!) 142/70    - Control is in target.  - No change in current plans.  3. Lipid status / Hyperlipidemia - Last  Lab Results  Component Value Date   LDLCALC 78 02/13/2023   - Continue Nexlizet.  Previously on rosuvastatin.  Managed by primary care provider.  # Morbid obesity : Was previously referred to bariatric surgery /bariatric center for potential bariatric surgery.  Patient is asked to call the clinic and make appointment.  Diagnoses and all orders for this visit:  Uncontrolled type 2 diabetes mellitus with hyperglycemia, with long-term current use of insulin (HCC) -     POCT glycosylated hemoglobin (Hb A1C) -     insulin aspart (NOVOLOG FLEXPEN) 100 UNIT/ML FlexPen; Inject 30 Units into the skin 3 (three) times daily with meals.  Type 2 diabetes mellitus with hyperglycemia, with long-term current use of insulin (HCC) -     Insulin Pen Needle (BD PEN NEEDLE MICRO U/F) 32G X 6 MM MISC;  Use as directed morning, noon, in the evening and at bedtime.  Morbid obesity (HCC)  Other orders -     Dapagliflozin Pro-metFORMIN ER (XIGDUO XR) 04-999 MG TB24; Take 1 tablet by mouth daily. -     insulin degludec (TRESIBA FLEXTOUCH) 200 UNIT/ML FlexTouch Pen; Inject 160 Units into the skin daily. -     tirzepatide (MOUNJARO) 10 MG/0.5ML Pen; Inject 10 mg into the skin once a week.    DISPOSITION Follow up in clinic in 3 months suggested.   All questions answered and patient verbalized understanding of the plan.  [place] Znya Albino, MD [facility] Endocrinology [facility] [address] [place], [address] [place], [place] [address] Phone # [phone]  At least part of this note was generated using voice recognition software. Inadvertent word errors may have occurred, which were not recognized during the proofreading process.

## 2023-07-19 NOTE — Patient Instructions (Addendum)
Diabetes regimen:  Tresiba 160 units daily.  Take novolog 30 units with breakfast, lunch and dinner, take 10-15 minutes before eating. Mounjaro and Xigduo same.

## 2023-07-30 ENCOUNTER — Other Ambulatory Visit (HOSPITAL_COMMUNITY): Payer: Self-pay

## 2023-08-21 ENCOUNTER — Ambulatory Visit: Payer: BC Managed Care – PPO | Admitting: Internal Medicine

## 2023-08-22 ENCOUNTER — Other Ambulatory Visit (HOSPITAL_COMMUNITY): Payer: Self-pay

## 2023-08-22 ENCOUNTER — Other Ambulatory Visit: Payer: Self-pay | Admitting: Internal Medicine

## 2023-08-22 ENCOUNTER — Other Ambulatory Visit: Payer: Self-pay

## 2023-08-22 DIAGNOSIS — I10 Essential (primary) hypertension: Secondary | ICD-10-CM

## 2023-08-22 DIAGNOSIS — E7801 Familial hypercholesterolemia: Secondary | ICD-10-CM

## 2023-08-22 DIAGNOSIS — E1169 Type 2 diabetes mellitus with other specified complication: Secondary | ICD-10-CM

## 2023-08-23 ENCOUNTER — Other Ambulatory Visit: Payer: Self-pay

## 2023-08-23 ENCOUNTER — Other Ambulatory Visit (HOSPITAL_COMMUNITY): Payer: Self-pay

## 2023-09-11 ENCOUNTER — Other Ambulatory Visit (HOSPITAL_COMMUNITY): Payer: Self-pay

## 2023-09-23 ENCOUNTER — Encounter: Payer: Self-pay | Admitting: Internal Medicine

## 2023-09-23 ENCOUNTER — Other Ambulatory Visit: Payer: Self-pay

## 2023-09-23 ENCOUNTER — Other Ambulatory Visit (HOSPITAL_COMMUNITY): Payer: Self-pay

## 2023-09-23 ENCOUNTER — Ambulatory Visit: Payer: BC Managed Care – PPO | Admitting: Internal Medicine

## 2023-09-23 VITALS — BP 146/84 | HR 78 | Temp 98.0°F | Resp 16 | Ht 77.0 in | Wt 339.6 lb

## 2023-09-23 DIAGNOSIS — I10 Essential (primary) hypertension: Secondary | ICD-10-CM | POA: Diagnosis not present

## 2023-09-23 DIAGNOSIS — Z794 Long term (current) use of insulin: Secondary | ICD-10-CM

## 2023-09-23 DIAGNOSIS — R0609 Other forms of dyspnea: Secondary | ICD-10-CM | POA: Insufficient documentation

## 2023-09-23 DIAGNOSIS — E5111 Dry beriberi: Secondary | ICD-10-CM

## 2023-09-23 DIAGNOSIS — E119 Type 2 diabetes mellitus without complications: Secondary | ICD-10-CM | POA: Diagnosis not present

## 2023-09-23 DIAGNOSIS — E7801 Familial hypercholesterolemia: Secondary | ICD-10-CM

## 2023-09-23 DIAGNOSIS — E785 Hyperlipidemia, unspecified: Secondary | ICD-10-CM | POA: Diagnosis not present

## 2023-09-23 LAB — URINALYSIS, ROUTINE W REFLEX MICROSCOPIC
Bilirubin Urine: NEGATIVE
Hgb urine dipstick: NEGATIVE
Ketones, ur: NEGATIVE
Leukocytes,Ua: NEGATIVE
Nitrite: NEGATIVE
RBC / HPF: NONE SEEN (ref 0–?)
Specific Gravity, Urine: 1.01 (ref 1.000–1.030)
Total Protein, Urine: NEGATIVE
Urine Glucose: NEGATIVE
Urobilinogen, UA: 0.2 (ref 0.0–1.0)
pH: 6 (ref 5.0–8.0)

## 2023-09-23 LAB — MICROALBUMIN / CREATININE URINE RATIO
Creatinine,U: 70 mg/dL
Microalb Creat Ratio: UNDETERMINED mg/g (ref 0.0–30.0)
Microalb, Ur: <0.7 mg/dL

## 2023-09-23 LAB — BASIC METABOLIC PANEL
BUN: 9 mg/dL (ref 6–23)
CO2: 27 meq/L (ref 19–32)
Calcium: 9.4 mg/dL (ref 8.4–10.5)
Chloride: 100 meq/L (ref 96–112)
Creatinine, Ser: 0.9 mg/dL (ref 0.40–1.50)
GFR: 106.14 mL/min (ref >60.00)
Glucose, Bld: 161 mg/dL — ABNORMAL HIGH (ref 70–99)
Potassium: 3.8 meq/L (ref 3.5–5.1)
Sodium: 136 meq/L (ref 135–145)

## 2023-09-23 LAB — CBC WITH DIFFERENTIAL/PLATELET
Basophils Absolute: 0.1 10*3/uL (ref 0.0–0.1)
Basophils Relative: 0.9 % (ref 0.0–3.0)
Eosinophils Absolute: 0.4 10*3/uL (ref 0.0–0.7)
Eosinophils Relative: 5.3 % — ABNORMAL HIGH (ref 0.0–5.0)
HCT: 39.3 % (ref 39.0–52.0)
Hemoglobin: 12.7 g/dL — ABNORMAL LOW (ref 13.0–17.0)
Lymphocytes Relative: 26.6 % (ref 12.0–46.0)
Lymphs Abs: 2 10*3/uL (ref 0.7–4.0)
MCHC: 32.3 g/dL (ref 30.0–36.0)
MCV: 75.7 fl — ABNORMAL LOW (ref 78.0–100.0)
Monocytes Absolute: 0.5 10*3/uL (ref 0.1–1.0)
Monocytes Relative: 7.2 % (ref 3.0–12.0)
Neutro Abs: 4.5 10*3/uL (ref 1.4–7.7)
Neutrophils Relative %: 60 % (ref 43.0–77.0)
Platelets: 319 10*3/uL (ref 150.0–400.0)
RBC: 5.19 Mil/uL (ref 4.22–5.81)
RDW: 16.1 % — ABNORMAL HIGH (ref 11.5–15.5)
WBC: 7.6 10*3/uL (ref 4.0–10.5)

## 2023-09-23 LAB — LIPID PANEL
Cholesterol: 149 mg/dL (ref 0–200)
HDL: 36.9 mg/dL — ABNORMAL LOW (ref >39.00)
LDL Cholesterol: 98 mg/dL (ref 0–99)
NonHDL: 112.01
Total CHOL/HDL Ratio: 4
Triglycerides: 69 mg/dL (ref 0.0–149.0)
VLDL: 13.8 mg/dL (ref 0.0–40.0)

## 2023-09-23 LAB — HEPATIC FUNCTION PANEL
ALT: 18 U/L (ref 0–53)
AST: 15 U/L (ref 0–37)
Albumin: 4.2 g/dL (ref 3.5–5.2)
Alkaline Phosphatase: 83 U/L (ref 39–117)
Bilirubin, Direct: 0.1 mg/dL (ref 0.0–0.3)
Total Bilirubin: 0.4 mg/dL (ref 0.2–1.2)
Total Protein: 7.6 g/dL (ref 6.0–8.3)

## 2023-09-23 LAB — TROPONIN I: Troponin I: <3 ng/L (ref <=47)

## 2023-09-23 LAB — TSH: TSH: 2.27 u[IU]/mL (ref 0.35–5.50)

## 2023-09-23 LAB — BRAIN NATRIURETIC PEPTIDE: Brain Natriuretic Peptide: 10 pg/mL (ref <100)

## 2023-09-23 MED ORDER — CANDESARTAN CILEXETIL 16 MG PO TABS
16.0000 mg | ORAL_TABLET | Freq: Every day | ORAL | 1 refills | Status: DC
Start: 2023-09-23 — End: 2024-02-26
  Filled 2023-09-23: qty 90, 90d supply, fill #0

## 2023-09-23 MED ORDER — ROSUVASTATIN CALCIUM 10 MG PO TABS
10.0000 mg | ORAL_TABLET | Freq: Every day | ORAL | 1 refills | Status: DC
  Filled 2023-09-23: qty 90, 90d supply, fill #0
  Filled 2024-04-03 (×3): qty 90, 90d supply, fill #1

## 2023-09-23 MED ORDER — INDAPAMIDE 1.25 MG PO TABS
1.2500 mg | ORAL_TABLET | Freq: Every day | ORAL | 1 refills | Status: DC
  Filled 2023-09-23: qty 90, 90d supply, fill #0
  Filled 2024-04-02: qty 90, 90d supply, fill #1

## 2023-09-23 MED ORDER — NEXLIZET 180-10 MG PO TABS
1.0000 | ORAL_TABLET | Freq: Every day | ORAL | 1 refills | Status: AC
  Filled 2023-09-23: qty 90, 90d supply, fill #0
  Filled 2024-04-02 – 2024-04-29 (×2): qty 90, 90d supply, fill #1

## 2023-09-23 MED ORDER — AMLODIPINE BESYLATE 5 MG PO TABS
5.0000 mg | ORAL_TABLET | Freq: Every day | ORAL | 1 refills | Status: DC
  Filled 2023-09-23: qty 90, 90d supply, fill #0

## 2023-09-23 NOTE — Patient Instructions (Signed)
 Hypertension, Adult High blood pressure (hypertension) is when the force of blood pumping through the arteries is too strong. The arteries are the blood vessels that carry blood from the heart throughout the body. Hypertension forces the heart to work harder to pump blood and may cause arteries to become narrow or stiff. Untreated or uncontrolled hypertension can lead to a heart attack, heart failure, a stroke, kidney disease, and other problems. A blood pressure reading consists of a higher number over a lower number. Ideally, your blood pressure should be below 120/80. The first ("top") number is called the systolic pressure. It is a measure of the pressure in your arteries as your heart beats. The second ("bottom") number is called the diastolic pressure. It is a measure of the pressure in your arteries as the heart relaxes. What are the causes? The exact cause of this condition is not known. There are some conditions that result in high blood pressure. What increases the risk? Certain factors may make you more likely to develop high blood pressure. Some of these risk factors are under your control, including: Smoking. Not getting enough exercise or physical activity. Being overweight. Having too much fat, sugar, calories, or salt (sodium) in your diet. Drinking too much alcohol. Other risk factors include: Having a personal history of heart disease, diabetes, high cholesterol, or kidney disease. Stress. Having a family history of high blood pressure and high cholesterol. Having obstructive sleep apnea. Age. The risk increases with age. What are the signs or symptoms? High blood pressure may not cause symptoms. Very high blood pressure (hypertensive crisis) may cause: Headache. Fast or irregular heartbeats (palpitations). Shortness of breath. Nosebleed. Nausea and vomiting. Vision changes. Severe chest pain, dizziness, and seizures. How is this diagnosed? This condition is diagnosed by  measuring your blood pressure while you are seated, with your arm resting on a flat surface, your legs uncrossed, and your feet flat on the floor. The cuff of the blood pressure monitor will be placed directly against the skin of your upper arm at the level of your heart. Blood pressure should be measured at least twice using the same arm. Certain conditions can cause a difference in blood pressure between your right and left arms. If you have a high blood pressure reading during one visit or you have normal blood pressure with other risk factors, you may be asked to: Return on a different day to have your blood pressure checked again. Monitor your blood pressure at home for 1 week or longer. If you are diagnosed with hypertension, you may have other blood or imaging tests to help your health care provider understand your overall risk for other conditions. How is this treated? This condition is treated by making healthy lifestyle changes, such as eating healthy foods, exercising more, and reducing your alcohol intake. You may be referred for counseling on a healthy diet and physical activity. Your health care provider may prescribe medicine if lifestyle changes are not enough to get your blood pressure under control and if: Your systolic blood pressure is above 130. Your diastolic blood pressure is above 80. Your personal target blood pressure may vary depending on your medical conditions, your age, and other factors. Follow these instructions at home: Eating and drinking  Eat a diet that is high in fiber and potassium, and low in sodium, added sugar, and fat. An example of this eating plan is called the DASH diet. DASH stands for Dietary Approaches to Stop Hypertension. To eat this way: Eat  plenty of fresh fruits and vegetables. Try to fill one half of your plate at each meal with fruits and vegetables. Eat whole grains, such as whole-wheat pasta, brown rice, or whole-grain bread. Fill about one  fourth of your plate with whole grains. Eat or drink low-fat dairy products, such as skim milk or low-fat yogurt. Avoid fatty cuts of meat, processed or cured meats, and poultry with skin. Fill about one fourth of your plate with lean proteins, such as fish, chicken without skin, beans, eggs, or tofu. Avoid pre-made and processed foods. These tend to be higher in sodium, added sugar, and fat. Reduce your daily sodium intake. Many people with hypertension should eat less than 1,500 mg of sodium a day. Do not drink alcohol if: Your health care provider tells you not to drink. You are pregnant, may be pregnant, or are planning to become pregnant. If you drink alcohol: Limit how much you have to: 0-1 drink a day for women. 0-2 drinks a day for men. Know how much alcohol is in your drink. In the [place] one drink equals one 12 oz bottle of beer (355 mL), one 5 oz glass of wine (148 mL), or one 1 oz glass of hard liquor (44 mL). Lifestyle  Work with your health care provider to maintain a healthy body weight or to lose weight. Ask what an ideal weight is for you. Get at least 30 minutes of exercise that causes your heart to beat faster (aerobic exercise) most days of the week. Activities may include walking, swimming, or biking. Include exercise to strengthen your muscles (resistance exercise), such as Pilates or lifting weights, as part of your weekly exercise routine. Try to do these types of exercises for 30 minutes at least 3 days a week. Do not use any products that contain nicotine or tobacco. These products include cigarettes, chewing tobacco, and vaping devices, such as e-cigarettes. If you need help quitting, ask your health care provider. Monitor your blood pressure at home as told by your health care provider. Keep all follow-up visits. This is important. Medicines Take over-the-counter and prescription medicines only as told by your health care provider. Follow directions carefully. Blood  pressure medicines must be taken as prescribed. Do not skip doses of blood pressure medicine. Doing this puts you at risk for problems and can make the medicine less effective. Ask your health care provider about side effects or reactions to medicines that you should watch for. Contact a health care provider if you: Think you are having a reaction to a medicine you are taking. Have headaches that keep coming back (recurring). Feel dizzy. Have swelling in your ankles. Have trouble with your vision. Get help right away if you: Develop a severe headache or confusion. Have unusual weakness or numbness. Feel faint. Have severe pain in your chest or abdomen. Vomit repeatedly. Have trouble breathing. These symptoms may be an emergency. Get help right away. Call 911. Do not wait to see if the symptoms will go away. Do not drive yourself to the hospital. Summary Hypertension is when the force of blood pumping through your arteries is too strong. If this condition is not controlled, it may put you at risk for serious complications. Your personal target blood pressure may vary depending on your medical conditions, your age, and other factors. For most people, a normal blood pressure is less than 120/80. Hypertension is treated with lifestyle changes, medicines, or a combination of both. Lifestyle changes include losing weight, eating a healthy,  low-sodium diet, exercising more, and limiting alcohol. This information is not intended to replace advice given to you by your health care provider. Make sure you discuss any questions you have with your health care provider. Document Revised: 04/25/2021 Document Reviewed: 04/25/2021 Elsevier Patient Education  2024 [employer].

## 2023-09-23 NOTE — Progress Notes (Unsigned)
 Subjective:  Patient ID: Stephen Hall, male    DOB: August 04, 1980  Age: 43 y.o. MRN: [id number]  CC: Hypertension, Hyperlipidemia, and Diabetes   HPI Stephen Hall presents for f/up ----  Discussed the use of AI scribe software for clinical note transcription with the patient, who gave verbal consent to proceed.  History of Present Illness   Stephen Hall is a 43 year old male with hypertension and diabetes who presents for blood pressure management and diabetes follow-up.  He has been monitoring his blood pressure and noted a recent reading of 146/80 mmHg. No associated symptoms such as headaches, blurred vision, chest pain, or shortness of breath. He sometimes experiences difficulty reading, which he attributes to needing reading glasses, a symptom first noticed during an eye exam last year.  He has a history of diabetes with an A1c of 9.6% in January, indicating poor glycemic control. His insulin regimen was adjusted, specifically changing his Novolog. He sometimes experiences hypoglycemia after taking fast-acting insulin, which he manages by eating snacks. He denies using a glucagon pen and prefers to manage low blood sugar with snacks. He feels jittery and irritable at times, which he associates with his blood sugar management.  He has lost five pounds over the last two months, though he is unsure how this occurred. No symptoms of dizziness, lightheadedness, excessive thirst, or excessive urination.  He is currently taking rosuvastatin and Nexlizet for cholesterol management and denies any side effects such as muscle aches or cramping.       Outpatient Medications Prior to Visit  Medication Sig Dispense Refill   albuterol (VENTOLIN HFA) 108 (90 Base) MCG/ACT inhaler Inhale 2 puffs into the lungs every 6 (six) hours as needed for wheezing or shortness of breath. 7.5 g 0   Continuous Glucose Sensor (DEXCOM G7 SENSOR) MISC Change sensor every 10 days 3 each 3   Dapagliflozin  Pro-metFORMIN ER (XIGDUO XR) 04-999 MG TB24 Take 1 tablet by mouth daily. 90 tablet 3   Glucagon (GVOKE HYPOPEN 2-PACK) 1 MG/0.2ML SOAJ Inject 1 pen into the skin daily as needed for low blood sugar 0.4 mL 5   insulin aspart (NOVOLOG FLEXPEN) 100 UNIT/ML FlexPen Inject 30 Units into the skin 3 (three) times daily with meals. 45 mL 4   insulin degludec (TRESIBA FLEXTOUCH) 200 UNIT/ML FlexTouch Pen Inject 160 Units into the skin daily. 81 mL 3   Insulin Pen Needle (BD PEN NEEDLE MICRO U/F) 32G X 6 MM MISC Use as directed morning, noon, in the evening and at bedtime. 300 each 3   Lancets (ONETOUCH DELICA PLUS LANCET33G) MISC USE TO CHECK BLOOD GLUCOSE THREE TIMES DAILY 100 each 1   tadalafil (CIALIS) 5 MG tablet Take 1 tablet (5 mg total) by mouth daily. 90 tablet 3   tirzepatide (MOUNJARO) 10 MG/0.5ML Pen Inject 10 mg into the skin once a week. 9 mL 3   Bempedoic Acid-Ezetimibe (NEXLIZET) 180-10 MG TABS Take 1 tablet by mouth daily. 90 tablet 1   Candesartan Cilexetil-HCTZ 32-25 MG TABS Take 1 tablet by mouth daily. 90 tablet 0   rosuvastatin (CRESTOR) 10 MG tablet Take 1 tablet (10 mg total) by mouth daily. 90 tablet 1   Blood Glucose Monitoring Suppl (ONETOUCH VERIO FLEX SYSTEM) w/Device KIT 1 Act by Does not apply route 3 (three) times daily. 2 kit 0   celecoxib (CELEBREX) 200 MG capsule Take 1 capsule by mouth 2 times daily as needed for pain 30 capsule 1   Cholecalciferol 50 MCG (  2000 UT) TABS Take 1 tablet (2,000 Units total) by mouth daily. 90 tablet 3   Continuous Blood Gluc Receiver (DEXCOM G6 RECEIVER) DEVI 1 Act by Does not apply route daily. 1 each 5   Continuous Blood Gluc Transmit (DEXCOM G6 TRANSMITTER) MISC Use as directed 1 each 5   HYDROcodone-acetaminophen (NORCO) 7.5-325 MG tablet Take 1 tablet by mouth every 4-6 hours as needed for pain. 12 tablet 0   naproxen sodium (ALEVE) 220 MG tablet Take 2 tablets by mouth for the first dose, then take 1 tablet by mouth every 8-12 hours. 24  tablet 0   thiamine (VITAMIN B1) 100 MG tablet Take 1 tablet (100 mg total) by mouth daily. 90 tablet 1   No facility-administered medications prior to visit.    ROS Review of Systems  Constitutional: Negative.  Negative for appetite change, chills, diaphoresis, fatigue and fever.  HENT: Negative.    Respiratory:  Positive for apnea and shortness of breath. Negative for choking and wheezing.   Cardiovascular:  Negative for chest pain, palpitations and leg swelling.  Gastrointestinal:  Negative for abdominal pain, constipation, diarrhea, nausea and vomiting.  Endocrine: Negative.   Genitourinary: Negative.  Negative for difficulty urinating.  Musculoskeletal:  Negative for arthralgias and myalgias.  Skin: Negative.   Neurological:  Negative for dizziness and weakness.  Hematological:  Negative for adenopathy. Does not bruise/bleed easily.  Psychiatric/Behavioral: Negative.      Objective:  BP (!) 146/84 (BP Location: Left Arm, Patient Position: Sitting, Cuff Size: Large)   Pulse 78   Temp 98 F (36.7 C) (Oral)   Resp 16   Ht 6\' 5"  (1.956 m)   Wt (!) 339 lb 9.6 oz (154 kg)   SpO2 97%   BMI 40.27 kg/m   BP Readings from Last 3 Encounters:  09/23/23 (!) 146/84  07/19/23 (!) 142/70  02/13/23 130/78    Wt Readings from Last 3 Encounters:  09/23/23 (!) 339 lb 9.6 oz (154 kg)  07/19/23 (!) 344 lb 3.2 oz (156.1 kg)  02/13/23 (!) 336 lb (152.4 kg)    Physical Exam Vitals reviewed.  Constitutional:      Appearance: He is obese. He is not ill-appearing.  HENT:     Nose: Nose normal.     Mouth/Throat:     Mouth: Mucous membranes are moist.  Eyes:     General: No scleral icterus.    Conjunctiva/sclera: Conjunctivae normal.  Cardiovascular:     Rate and Rhythm: Normal rate and regular rhythm.     Heart sounds: No murmur heard.    Comments: EKG--- NSR, 84 bpm No LVH, Q waves, or ST/T wave changes  Pulmonary:     Effort: Pulmonary effort is normal.     Breath sounds:  No stridor. No wheezing, rhonchi or rales.  Abdominal:     General: Abdomen is flat.     Palpations: There is no mass.     Tenderness: There is no abdominal tenderness. There is no guarding.     Hernia: No hernia is present.  Musculoskeletal:        General: Normal range of motion.     Cervical back: Neck supple.     Right lower leg: No edema.     Left lower leg: No edema.  Lymphadenopathy:     Cervical: No cervical adenopathy.  Skin:    General: Skin is warm and dry.  Neurological:     General: No focal deficit present.     Mental  Status: He is alert.  Psychiatric:        Mood and Affect: Mood normal.        Behavior: Behavior normal.     Lab Results  Component Value Date   WBC 7.6 09/23/2023   HGB 12.7 (L) 09/23/2023   HCT 39.3 09/23/2023   PLT 319.0 09/23/2023   GLUCOSE 161 (H) 09/23/2023   CHOL 149 09/23/2023   TRIG 69.0 09/23/2023   HDL 36.90 (L) 09/23/2023   LDLDIRECT 158.1 02/07/2012   LDLCALC 98 09/23/2023   ALT 18 09/23/2023   AST 15 09/23/2023   NA 136 09/23/2023   K 3.8 09/23/2023   CL 100 09/23/2023   CREATININE 0.90 09/23/2023   BUN 9 09/23/2023   CO2 27 09/23/2023   TSH 2.27 09/23/2023   PSA 0.71 02/13/2023   HGBA1C 9.6 (A) 07/19/2023   MICROALBUR <0.7 09/23/2023    No results found.  Assessment & Plan:  DOE (dyspnea on exertion) -     CT CARDIAC SCORING ([facility] LOCATIONS ONLY); Future -     Brain natriuretic peptide; Future -     Troponin I (High Sensitivity); Future -     Troponin I -; Future  Insulin-requiring or dependent type II diabetes mellitus (HCC) -     Urinalysis, Routine w reflex microscopic; Future -     Microalbumin / creatinine urine ratio; Future -     Basic metabolic panel; Future -     CT CARDIAC SCORING ([facility] LOCATIONS ONLY); Future -     Troponin I -; Future  Thiamine deficiency neuropathy -     Vitamin B1; Future -     Troponin I -; Future  Hyperlipidemia with target LDL less than 130 -     Lipid panel; Future -      TSH; Future -     Hepatic function panel; Future -     CT CARDIAC SCORING ([facility] LOCATIONS ONLY); Future -     Troponin I -; Future -     Nexlizet; Take 1 tablet by mouth daily.  Dispense: 90 tablet; Refill: 1 -     Rosuvastatin Calcium; Take 1 tablet (10 mg total) by mouth daily.  Dispense: 90 tablet; Refill: 1  Essential hypertension -     TSH; Future -     Hepatic function panel; Future -     CBC with Differential/Platelet; Future -     Basic metabolic panel; Future -     EKG 12-Lead -     CT CARDIAC SCORING ([facility] LOCATIONS ONLY); Future -     Troponin I -; Future  Heterozygous familial hypercholesterolemia -     Nexlizet; Take 1 tablet by mouth daily.  Dispense: 90 tablet; Refill: 1 -     Rosuvastatin Calcium; Take 1 tablet (10 mg total) by mouth daily.  Dispense: 90 tablet; Refill: 1     Follow-up: Return in about 4 months (around 01/23/2024).  Sanda Linger, MD

## 2023-09-26 ENCOUNTER — Telehealth: Payer: Self-pay | Admitting: *Deleted

## 2023-09-26 ENCOUNTER — Other Ambulatory Visit (HOSPITAL_COMMUNITY): Payer: Self-pay

## 2023-09-26 ENCOUNTER — Other Ambulatory Visit: Payer: Self-pay

## 2023-09-26 LAB — VITAMIN B1: Vitamin B1 (Thiamine): 6 nmol/L — ABNORMAL LOW (ref 8–30)

## 2023-09-26 MED ORDER — VITAMIN B-1 50 MG PO TABS
50.0000 mg | ORAL_TABLET | Freq: Every day | ORAL | 1 refills | Status: AC
  Filled 2023-09-26 (×2): qty 100, 100d supply, fill #0
  Filled 2024-04-02 – 2024-04-29 (×2): qty 100, 100d supply, fill #1

## 2023-09-26 NOTE — Progress Notes (Signed)
 Care Guide Pharmacy Note  09/26/2023 Name: Nakota Elsen MRN: [id number] DOB: September 04, 1980  Referred By: Etta Grandchild, MD Reason for referral: Care Coordination (Outreach to schedule with pharmacist )   Antoin Dargis is a 43 y.o. year old male who is a primary care patient of Etta Grandchild, MD.  Tahjae Clausing was referred to the pharmacist for assistance related to: DMII  Successful contact was made with the patient to discuss pharmacy services including being ready for the pharmacist to call at least 5 minutes before the scheduled appointment time and to have medication bottles and any blood pressure readings ready for review. The patient agreed to meet with the pharmacist via telephone visit on 10/17/2023  [name], CMA, Care Guide [facility], [facility] Guide Direct Dial: [phone]  Fax: [phone] Website: [email or web address]

## 2023-09-26 NOTE — Progress Notes (Signed)
 Care Guide Pharmacy Note  09/26/2023 Name: Stephen Hall MRN: [id number] DOB: 16-Mar-1981  Referred By: Etta Grandchild, MD Reason for referral: Care Coordination (Outreach to schedule with pharmacist )   Stephen Hall is a 43 y.o. year old male who is a primary care patient of Etta Grandchild, MD.  Stephen Hall was referred to the pharmacist for assistance related to: DMII  An unsuccessful telephone outreach was attempted today to contact the patient who was referred to the pharmacy team for assistance with medication management. Additional attempts will be made to contact the patient.  [name], CMA [place]  [facility], [facility] Guide Direct Dial: [phone]  Fax: [phone] Website: [email or web address]

## 2023-09-27 ENCOUNTER — Other Ambulatory Visit (HOSPITAL_COMMUNITY): Payer: Self-pay

## 2023-10-17 ENCOUNTER — Other Ambulatory Visit (INDEPENDENT_AMBULATORY_CARE_PROVIDER_SITE_OTHER): Admitting: Pharmacist

## 2023-10-17 DIAGNOSIS — Z794 Long term (current) use of insulin: Secondary | ICD-10-CM

## 2023-10-17 DIAGNOSIS — E119 Type 2 diabetes mellitus without complications: Secondary | ICD-10-CM

## 2023-10-17 DIAGNOSIS — I10 Essential (primary) hypertension: Secondary | ICD-10-CM

## 2023-10-17 NOTE — Progress Notes (Signed)
 10/17/2023 Name: Stephen Hall MRN: [id number] DOB: [birth year]  Chief Complaint  Patient presents with   Diabetes   Hypertension   Medication Management    Stephen Hall is a 43 y.o. year old male who presented for a telephone visit.   They were referred to the pharmacist by their PCP for assistance in managing diabetes and hypertension.   Subjective:  Care Team: Primary Care Provider: Arcadio Knuckles, MD ; Next Scheduled Visit: 01/28/24 Endocrinologist Dr. Aretha Kubas; Next Scheduled Visit: 10/25/23  Medication Access/Adherence  Current Pharmacy:  [facility] - [facility] [address] [place] [address] Phone: [phone] Fax: [phone]  [facility] [place], [place] - [address] [place] [address] Phone: [phone] Fax: [phone]  [facility] - [place], [place] - [address] [place] [address] Phone: [phone] Fax: [phone]  [id number] Jonette Nestle, [place] - [address] AT [facility] OF [address] [place] [address] Phone: [phone] Fax: [phone]  [facility] (New Address) - [place], [place] - [address] [facility] AT Previously: [address], [place] [address] [facility] [address] [place] [address] Phone: [phone] Fax: [phone]  [place] [facility] [address] [place] [address] Phone: [phone] Fax: [phone]   Patient reports affordability concerns with their medications: Yes  Patient reports access/transportation concerns to their pharmacy: No  Patient reports adherence concerns with their medications:  No     Diabetes: *Followed by endo - Dr. Aretha Kubas Current medications: Xigduo  XR 04/999 mg daily, Mounjaro  10 mg weekly, Tresiba  160 units  per day, Novolog  30 units TID AC Medications tried in the past: Mounjaro  12.5 mg caused GI upset  *He notes he feels the Novolog  does not bring his sugars down as much or as quickly as they used to. He reports he often takes Novolog  after eating because with his schedule, it is too difficult to take it 30 minutes before eating  Current glucose readings: BG 200-250 8.3 GMI 90 day, 8.8 GMI 7 day Using Dexcom G7   Patient denies hypoglycemic s/sx including dizziness, shakiness, sweating. Patient reports hyperglycemic symptoms including polyuria  Current meal patterns:  Pt describes his diet as "terrible" because he is always eating on the go. Has tried to cut back on carbs and eat smaller portion but notes the overall food choices are not ideal. Pt also reports drinking regular sodas (Coke, Sprite, [employer]), at least 3 cans per day  Current physical activity: none outside of regular daily movement  Current medication access support: none  - he thought he used to get coupons but notes he feels his medication cost has been higher lately. Unknown if he still is getting coupons for his brand name medications.  Hypertension:  Current medications: candesartan /hydrochlorothiazide  32/25 mg daily *PCP stopped candesartan /hydrochlorothiazide  and started candesartan  16 mg, indapamide  1.25 mg, and amlodipine  5 mg at last visit 3/24 however pt has not changed yet because he wanted to finish his current supply   Patient does not have a validated, automated, upper arm home BP cuff Current blood pressure readings: none   Objective:  Lab Results  Component Value Date   HGBA1C 9.6 (A) 07/19/2023    Lab Results  Component Value Date   CREATININE 0.90 09/23/2023  BUN 9 09/23/2023   NA 136 09/23/2023   K 3.8 09/23/2023   CL 100 09/23/2023   CO2 27 09/23/2023    Lab Results  Component Value Date   CHOL 149 09/23/2023   HDL 36.90 (L) 09/23/2023   LDLCALC 98 09/23/2023   LDLDIRECT 158.1  02/07/2012   TRIG 69.0 09/23/2023   CHOLHDL 4 09/23/2023    Medications Reviewed Today     Reviewed by Dion Frankel, RPH (Pharmacist) on 10/17/23 at [phone]  Med List Status: <None>   Medication Order Taking? Sig Documenting Provider Last Dose Status Informant  albuterol  (VENTOLIN  HFA) 108 (90 Base) MCG/ACT inhaler [id number]  Inhale 2 puffs into the lungs every 6 (six) hours as needed for wheezing or shortness of breath. Mardene Shake, FNP  Active   amLODipine  (NORVASC ) 5 MG tablet [id number] No Take 1 tablet (5 mg total) by mouth daily.  Patient not taking: Reported on 10/17/2023   Arcadio Knuckles, MD Not Taking Active   Bempedoic Acid -Ezetimibe  (NEXLIZET ) 180-10 MG TABS [id number] Yes Take 1 tablet by mouth daily. Arcadio Knuckles, MD Taking Active   candesartan  (ATACAND ) 16 MG tablet [id number] No Take 1 tablet (16 mg total) by mouth daily.  Patient not taking: Reported on 10/17/2023   Arcadio Knuckles, MD Not Taking Active            Med Note Lolly Riser, Jujhar Everett R   Thu Oct 17, 2023  9:51 AM) Still taking candesartan /HCTZ  Continuous Glucose Sensor (DEXCOM G7 SENSOR) MISC [id number]  Change sensor every 10 days Thapa, [place], MD  Active   Dapagliflozin  Pro-metFORMIN  ER (XIGDUO  XR) 04-999 MG TB24 [id number] Yes Take 1 tablet by mouth daily. Thapa, [place], MD Taking Active   Glucagon  (GVOKE HYPOPEN  2-PACK) 1 MG/0.2ML [place] [id number]  Inject 1 pen into the skin daily as needed for low blood sugar Arcadio Knuckles, MD  Active   indapamide  (LOZOL ) 1.25 MG tablet [id number] No Take 1 tablet (1.25 mg total) by mouth daily.  Patient not taking: Reported on 10/17/2023   Arcadio Knuckles, MD Not Taking Active   insulin  aspart (NOVOLOG  FLEXPEN) 100 UNIT/ML FlexPen [id number] Yes Inject 30 Units into the skin 3 (three) times daily with meals. Thapa, [place], MD Taking Active   insulin  degludec (TRESIBA  FLEXTOUCH) 200 UNIT/ML FlexTouch Pen [id number] Yes Inject 160 Units into the skin daily. Thapa, [place], MD  Taking Active   Insulin  Pen Needle (BD PEN NEEDLE MICRO U/F) 32G X 6 MM MISC [id number]  Use as directed morning, noon, in the evening and at bedtime. Thapa, [place], MD  Active   Lancets [facility] DELICA PLUS [place]) MISC [id number]  USE TO CHECK BLOOD GLUCOSE THREE TIMES DAILY Gwyndolyn Lerner, MD  Active   rosuvastatin  (CRESTOR ) 10 MG tablet [id number] Yes Take 1 tablet (10 mg total) by mouth daily. Arcadio Knuckles, MD Taking Active   tadalafil  (CIALIS ) 5 MG tablet [id number]  Take 1 tablet (5 mg total) by mouth daily.   Active   thiamine  (VITAMIN B-1) 50 MG tablet [id number]  Take 1 tablet (50 mg total) by mouth daily. Arcadio Knuckles, MD  Active   tirzepatide  (MOUNJARO ) 10 MG/0.5ML Pen [id number] Yes Inject 10 mg into the skin once a week. Thapa, [place], MD Taking Active               Assessment/Plan:   Diabetes: - Currently uncontrolled, A1c goal <7% - Reviewed long term cardiovascular and renal outcomes of uncontrolled blood sugar -  Reviewed goal A1c, goal fasting, and goal 2 hour post prandial glucose - Reviewed dietary modifications including eliminating high sugar beverages such as sodas and juices, encouraged increased protein/fiber and balanced meals/snacks - Reviewed lifestyle modifications including: increased movement/walking - Reviewed proper use of insulin . Novolog  to be taken 10-15 minutes prior to eating, which may help prevent high BG spikes after eating. - Recommend to continue current regimen, with focus on timing of Novolog  doses   Med access: Sending copay cards to [facility] Xigduo  BIN [id number] PCN: [id number], GRP: [id number], ID: [id number]   Nexlizet  - I re-enrolled pt in copay card program, it said to use same copay card as previous   Appears Mounjaro  already has a copay card and his insulins were $0   Hypertension: - Currently uncontrolled, BP goal <130/80 - Reviewed long term cardiovascular and renal outcomes of uncontrolled blood pressure - Reviewed appropriate blood  pressure monitoring technique and reviewed goal blood pressure. Recommended to check home blood pressure and heart rate daily. Recommended pt get an arm BP monitor to check at home - Recommend to start candesartan , indapamide , and amlodipine  in place of candesartan /hydrochlorothiazide  as prescribed by PCP     Follow Up Plan: 5/8  Rainelle Bur, PharmD, BCPS, CPP Clinical Pharmacist Practitioner [place] [facility] [phone]

## 2023-10-18 ENCOUNTER — Other Ambulatory Visit (HOSPITAL_COMMUNITY): Payer: Self-pay

## 2023-10-18 ENCOUNTER — Ambulatory Visit
Admission: RE | Admit: 2023-10-18 | Discharge: 2023-10-18 | Disposition: A | Discharge status: Home / Self Care | Source: Ambulatory Visit | Attending: Internal Medicine | Admitting: Internal Medicine

## 2023-10-18 DIAGNOSIS — I10 Essential (primary) hypertension: Secondary | ICD-10-CM

## 2023-10-18 DIAGNOSIS — E785 Hyperlipidemia, unspecified: Secondary | ICD-10-CM

## 2023-10-18 DIAGNOSIS — R0609 Other forms of dyspnea: Secondary | ICD-10-CM

## 2023-10-18 DIAGNOSIS — E119 Type 2 diabetes mellitus without complications: Secondary | ICD-10-CM

## 2023-10-18 NOTE — Patient Instructions (Signed)
 It was a pleasure speaking with you today!  Work on reducing high sugar drinks and increasing protein/fiber to help lower sugars.  Try to take Novolog  10-15 minutes prior to eating to help control blood sugars while eating a meal.  I have sent copay cards to your pharmacy to try to reduce your medication costs.  Purchase a arm blood pressure monitor to check your blood pressure at home. We want to make sure it is not staying elevated long term. Stop candesartan /hydrochlorothiazide  and start candesartan , amlodipine , and indapamide .   Feel free to call with any questions or concerns!  Rainelle Bur, PharmD, BCPS, CPP Clinical Pharmacist Practitioner  Primary Care at [facility] [phone]

## 2023-10-25 ENCOUNTER — Telehealth: Payer: Self-pay | Admitting: Internal Medicine

## 2023-10-25 ENCOUNTER — Ambulatory Visit: Payer: 59 | Admitting: Endocrinology

## 2023-10-25 ENCOUNTER — Other Ambulatory Visit (HOSPITAL_COMMUNITY): Payer: Self-pay

## 2023-10-25 ENCOUNTER — Encounter: Payer: Self-pay | Admitting: Endocrinology

## 2023-10-25 ENCOUNTER — Other Ambulatory Visit: Payer: Self-pay

## 2023-10-25 VITALS — BP 136/80 | HR 86 | Ht 77.0 in | Wt 338.0 lb

## 2023-10-25 DIAGNOSIS — Z794 Long term (current) use of insulin: Secondary | ICD-10-CM

## 2023-10-25 DIAGNOSIS — E1165 Type 2 diabetes mellitus with hyperglycemia: Secondary | ICD-10-CM | POA: Diagnosis not present

## 2023-10-25 LAB — POCT GLYCOSYLATED HEMOGLOBIN (HGB A1C): Hemoglobin A1C: 8.9 % — AB (ref 4.0–5.6)

## 2023-10-25 MED ORDER — NOVOLOG FLEXPEN 100 UNIT/ML ~~LOC~~ SOPN
40.0000 [IU] | PEN_INJECTOR | Freq: Three times a day (TID) | SUBCUTANEOUS | 4 refills | Status: DC
  Filled 2023-10-25: qty 15, 13d supply, fill #0
  Filled 2023-10-25: qty 30, 25d supply, fill #0

## 2023-10-25 NOTE — Patient Instructions (Signed)
 Latest Reference Range & Units 02/13/23 11:45 07/19/23 08:14 10/25/23 08:56  Hemoglobin A1C 4.0 - 5.6 % 8.4 (H) 9.6 ! Pend 8.9 !  (H): Data is abnormally high !: Data is abnormal  Diabetes regimen:  Tresiba  160 units daily.  Take novolog  40 units with breakfast, lunch and dinner, take 10-15 minutes before eating. Mounjaro  and Xigduo  same.

## 2023-10-25 NOTE — Telephone Encounter (Signed)
 Form placed in Dr. Rochelle Chu box for patient wanting Bariatric Surgery - Please contact patient when this is completed.

## 2023-10-25 NOTE — Progress Notes (Signed)
 Outpatient Endocrinology Note [place] Chong January, MD   Patient's Name: Stephen Hall    DOB: 06-17-1981    MRN: [id number]                                                    REASON OF VISIT: Follow up for type 2 diabetes mellitus  PCP: Arcadio Knuckles, MD  HISTORY OF PRESENT ILLNESS:   Makih Stefanko is a 43 y.o. old male with past medical history listed below, is here for follow up for type 2 diabetes mellitus.   Pertinent Diabetes History: Patient was previously seen by Dr. Hubert Madden and was last time seen in August 2024.  Patient was diagnosed with type 2 diabetes mellitus in 2015.  Insulin  therapy was started in 2017.  He has uncontrolled type 2 diabetes mellitus.  No personal history of pancreatitis and / or family history of medullary thyroid  carcinoma or MEN 2B syndrome.   Chronic Diabetes Complications : Retinopathy: no. Last ophthalmology exam was done on 12/2022, following with ophthalmology regularly.  Nephropathy: no, on ACE/ARB /candesartan  Peripheral neuropathy: no Coronary artery disease: no Stroke: no  Relevant comorbidities and cardiovascular risk factors: Obesity: yes Body mass index is 40.08 kg/m.  Hypertension: Yes  Hyperlipidemia : Yes, on statin   Current / Home Diabetic regimen includes:  Non-insulin  hypoglycemic drugs: Mounjaro  10 mg weekly, Xigduo  10/998 mg 1 tablet daily     Insulin  regimen: 160 units of U-200 Tresiba  daily, NovoLog  30 units before meals , taking with lunch and supper.    Prior diabetic medications: Soliqua , Lantus  insulin , Humalog  , Prandin ,? Metformin .  GI upset with higher dose of Mounjaro .  Glycemic data:    CONTINUOUS GLUCOSE MONITORING SYSTEM (CGMS) INTERPRETATION: At today's visit, we reviewed CGM downloads. The full report is scanned in the media. Reviewing the CGM trends, blood glucose are as follows:  Dexcom G7 CGM-  Sensor Download (Sensor download was reviewed and summarized below.) Dates: April 12 to October 25, 2023, 14  days  Sensor use is 85%  Glucose Management Indicator: 8.4%    Interpretation: Significant hyperglycemia mainly with the lunch with blood sugar up to 250-300 range.  Occasional hyperglycemia with supper and rarely with breakfast.  Some of the time hyperglycemia with blood sugar in 200 range throughout the night.  Some of the time acceptable blood sugar overnight and in between the meals in the afternoon.  No hypoglycemia.  Hypoglycemia: Patient has no hypoglycemic episodes. Patient has hypoglycemia awareness.  Factors modifying glucose control: 1.  Diabetic diet assessment: 3 meals a day.  Drinking regular sodas.  Frequent fast food.  2.  Staying active or exercising: No formal exercise.  3.  Medication compliance: compliant most of the time.  Other: He has obstructive sleep apnea and on CPAP.  Interval history  Diabetes regimen as reviewed above.  CGM data as reviewed above.  Mostly significant hyperglycemia with lunch and sometime with supper.  He reports compliance with taking insulin  as noted above.  Hemoglobin A1c slightly improved to 8.9%.  He is interested in bariatric surgery, completed pediatrics surgery form brought by patient today.  He was referred to pediatric surgery in last visit.  He has been taking Mounjaro  10 mg weekly, he still has issue with satiety and does not want to increase the dose.  He has no other complaints  today.  REVIEW OF SYSTEMS As per history of present illness.   PAST MEDICAL HISTORY: Past Medical History:  Diagnosis Date   Asthma    Diabetes mellitus without complication (HCC)    Environmental allergies    Hyperlipidemia    Hypertension     PAST SURGICAL HISTORY: History reviewed. No pertinent surgical history.  ALLERGIES: No Known Allergies  FAMILY HISTORY:  Family History  Problem Relation Age of Onset   Alcohol abuse Other    Arthritis Other    Hypertension Other    Diabetes Other    Obesity Mother    Hypertension Mother     Obesity Father    Hypertension Father    Cancer Neg Hx    Heart disease Neg Hx    Stroke Neg Hx    Kidney disease Neg Hx     SOCIAL HISTORY: Social History   Socioeconomic History   Marital status: Married    Spouse name: Not on file   Number of children: Not on file   Years of education: Not on file   Highest education level: Not on file  Occupational History   Not on file  Tobacco Use   Smoking status: Never   Smokeless tobacco: Never  Substance and Sexual Activity   Alcohol use: No   Drug use: No   Sexual activity: Yes  Other Topics Concern   Not on file  Social History Narrative   Caffienated drinks-yes   Seat belt use often-yes   Regular Exercise-no   Smoke alarm in the home-yes   Firearms/guns in the home-no   History of physical abuse-no               Social Drivers of Corporate investment banker Strain: Not on file  Food Insecurity: Not on file  Transportation Needs: Not on file  Physical Activity: Not on file  Stress: Not on file  Social Connections: Not on file    MEDICATIONS:  Current Outpatient Medications  Medication Sig Dispense Refill   albuterol  (VENTOLIN  HFA) 108 (90 Base) MCG/ACT inhaler Inhale 2 puffs into the lungs every 6 (six) hours as needed for wheezing or shortness of breath. 7.5 g 0   amLODipine  (NORVASC ) 5 MG tablet Take 1 tablet (5 mg total) by mouth daily. 90 tablet 1   Bempedoic Acid -Ezetimibe  (NEXLIZET ) 180-10 MG TABS Take 1 tablet by mouth daily. 90 tablet 1   candesartan  (ATACAND ) 16 MG tablet Take 1 tablet (16 mg total) by mouth daily. 90 tablet 1   Continuous Glucose Sensor (DEXCOM G7 SENSOR) MISC Change sensor every 10 days 3 each 3   Dapagliflozin  Pro-metFORMIN  ER (XIGDUO  XR) 04-999 MG TB24 Take 1 tablet by mouth daily. 90 tablet 3   Glucagon  (GVOKE HYPOPEN  2-PACK) 1 MG/0.2ML SOAJ Inject 1 pen into the skin daily as needed for low blood sugar 0.4 mL 5   indapamide  (LOZOL ) 1.25 MG tablet Take 1 tablet (1.25 mg total)  by mouth daily. 90 tablet 1   insulin  degludec (TRESIBA  FLEXTOUCH) 200 UNIT/ML FlexTouch Pen Inject 160 Units into the skin daily. 81 mL 3   Insulin  Pen Needle (BD PEN NEEDLE MICRO U/F) 32G X 6 MM MISC Use as directed morning, noon, in the evening and at bedtime. 300 each 3   Lancets (ONETOUCH DELICA PLUS LANCET33G) MISC USE TO CHECK BLOOD GLUCOSE THREE TIMES DAILY 100 each 1   rosuvastatin  (CRESTOR ) 10 MG tablet Take 1 tablet (10 mg total) by mouth daily. 90 tablet  1   tadalafil  (CIALIS ) 5 MG tablet Take 1 tablet (5 mg total) by mouth daily. 90 tablet 3   thiamine  (VITAMIN B-1) 50 MG tablet Take 1 tablet (50 mg total) by mouth daily. 100 tablet 1   tirzepatide  (MOUNJARO ) 10 MG/0.5ML Pen Inject 10 mg into the skin once a week. 9 mL 3   insulin  aspart (NOVOLOG  FLEXPEN) 100 UNIT/ML FlexPen Inject 40 Units into the skin 3 (three) times daily with meals. 45 mL 4   No current facility-administered medications for this visit.    PHYSICAL EXAM: Vitals:   10/25/23 0847  BP: 136/80  Pulse: 86  SpO2: 98%  Weight: (!) 338 lb (153.3 kg)  Height: 6\' 5"  (1.956 m)    Body mass index is 40.08 kg/m.  Wt Readings from Last 3 Encounters:  10/25/23 (!) 338 lb (153.3 kg)  09/23/23 (!) 339 lb 9.6 oz (154 kg)  07/19/23 (!) 344 lb 3.2 oz (156.1 kg)    General: Well developed, well nourished male in no apparent distress.  HEENT: AT/[place], no external lesions.  Eyes: Conjunctiva clear and no icterus. Neck: Neck supple  Lungs: Respirations not labored Neurologic: Alert, oriented, normal speech Extremities / Skin: Dry.  Psychiatric: Does not appear depressed or anxious  Diabetic Foot Exam - Simple   No data filed    LABS Reviewed Lab Results  Component Value Date   HGBA1C 8.9 (A) 10/25/2023   HGBA1C 9.6 (A) 07/19/2023   HGBA1C 8.4 (H) 02/13/2023   Lab Results  Component Value Date   FRUCTOSAMINE 316 (H) 03/07/2022   FRUCTOSAMINE 275 10/26/2020   FRUCTOSAMINE 209 07/23/2016   Lab Results   Component Value Date   CHOL 149 09/23/2023   HDL 36.90 (L) 09/23/2023   LDLCALC 98 09/23/2023   LDLDIRECT 158.1 02/07/2012   TRIG 69.0 09/23/2023   CHOLHDL 4 09/23/2023   Lab Results  Component Value Date   MICRALBCREAT Unable to calculate 09/23/2023   MICRALBCREAT 0.8 01/29/2023   Lab Results  Component Value Date   CREATININE 0.90 09/23/2023   Lab Results  Component Value Date   GFR 106.14 09/23/2023    ASSESSMENT / PLAN  1. Uncontrolled type 2 diabetes mellitus with hyperglycemia, with long-term current use of insulin  (HCC)    Diabetes Mellitus type 2, complicated by no known complications. - Diabetic status / severity: Uncontrolled, worsening  Lab Results  Component Value Date   HGBA1C 8.9 (A) 10/25/2023    - Hemoglobin A1c goal : <6.5%  Discussed about importance of controlling diabetes to prevent chronic complications including diabetic retinopathy, neuropathy and nephropathy.  Discussed about compliance with diabetes regimen.  Discussed about taking NovoLog  with each meals breakfast, lunch and supper and asked to take 10 to 15 minutes before.  He reports he is at this time taking 2 times a day NovoLog .  Adjusted diabetes regimen as follows.  - Medications: See below  I) continue Tresiba  160 units daily. II) increase NovoLog  from 30 to 40 units with meals 3 times a day, 10 to 15 minutes before eating. III) continue Mounjaro  10 mg weekly. IV) continue Xigduo  10/998 mg 1 tablet daily  - Home glucose testing: Dexcom G7 and check as needed.  - Discussed/ Gave Hypoglycemia treatment plan.  # Consult : not required at this time.   # Annual urine for microalbuminuria/ creatinine ratio, no microalbuminuria currently, continue ACE/ARB / candesartan . Last  Lab Results  Component Value Date   MICRALBCREAT Unable to calculate 09/23/2023    #  Foot check nightly.  # Annual dilated diabetic eye exams.   - Diet: Make healthy diabetic food choices.  Discussed  in detail.  Asked to avoid regular soft drinks.  Asked to limit carbohydrates and fast food. - Life style / activity / exercise: Discussed.  2. Blood pressure  -  BP Readings from Last 1 Encounters:  10/25/23 136/80    - Control is in target.  - No change in current plans.  3. Lipid status / Hyperlipidemia - Last  Lab Results  Component Value Date   LDLCALC 98 09/23/2023   - Continue Nexlizet .  Previously on rosuvastatin .  Managed by primary care provider.  # Morbid obesity : Was previously referred to bariatric surgery /bariatric center for potential bariatric surgery.  Patient is asked to call the clinic and make appointment.  Bariatric surgery requested form completed and provided to the patient in the clinic today.  Diagnoses and all orders for this visit:  Uncontrolled type 2 diabetes mellitus with hyperglycemia, with long-term current use of insulin  (HCC) -     POCT glycosylated hemoglobin (Hb A1C) -     insulin  aspart (NOVOLOG  FLEXPEN) 100 UNIT/ML FlexPen; Inject 40 Units into the skin 3 (three) times daily with meals.    DISPOSITION Follow up in clinic in 3 months suggested.   All questions answered and patient verbalized understanding of the plan.  [place]ra[place] Marlie Kuennen, MD [facility] Endocrinology [facility] [address] [place], [address] [place], [place] [address] Phone # [phone]  At least part of this note was generated using voice recognition software. Inadvertent word errors may have occurred, which were not recognized during the proofreading process.

## 2023-10-29 ENCOUNTER — Encounter: Payer: Self-pay | Admitting: Internal Medicine

## 2023-10-29 ENCOUNTER — Other Ambulatory Visit (HOSPITAL_COMMUNITY): Payer: Self-pay

## 2023-10-30 ENCOUNTER — Other Ambulatory Visit (HOSPITAL_COMMUNITY): Payer: Self-pay

## 2023-11-01 NOTE — Telephone Encounter (Signed)
 Form has been completed and faxed.

## 2023-11-05 ENCOUNTER — Other Ambulatory Visit (HOSPITAL_COMMUNITY): Payer: Self-pay

## 2023-11-05 DIAGNOSIS — N4883 Acquired buried penis: Secondary | ICD-10-CM | POA: Insufficient documentation

## 2023-11-05 DIAGNOSIS — E1165 Type 2 diabetes mellitus with hyperglycemia: Secondary | ICD-10-CM | POA: Insufficient documentation

## 2023-11-05 MED ORDER — SILDENAFIL CITRATE 100 MG PO TABS
100.0000 mg | ORAL_TABLET | Freq: Every day | ORAL | 3 refills | Status: AC | PRN
  Filled 2023-11-05: qty 30, 30d supply, fill #0

## 2023-11-07 ENCOUNTER — Other Ambulatory Visit (HOSPITAL_COMMUNITY): Payer: Self-pay

## 2023-11-07 ENCOUNTER — Other Ambulatory Visit: Payer: Self-pay

## 2023-11-07 ENCOUNTER — Other Ambulatory Visit: Admitting: Pharmacist

## 2023-11-07 DIAGNOSIS — Z794 Long term (current) use of insulin: Secondary | ICD-10-CM

## 2023-11-07 DIAGNOSIS — I10 Essential (primary) hypertension: Secondary | ICD-10-CM

## 2023-11-07 DIAGNOSIS — E119 Type 2 diabetes mellitus without complications: Secondary | ICD-10-CM

## 2023-11-07 NOTE — Progress Notes (Signed)
 11/07/2023 Name: Stephen Hall MRN: [id number] DOB: April 08, 1981  Chief Complaint  Patient presents with   Diabetes   Hypertension    Stephen Hall is a 43 y.o. year old male who presented for a telephone visit.   They were referred to the pharmacist by their PCP for assistance in managing diabetes and hypertension.   Subjective:  Care Team: Primary Care Provider: Arcadio Knuckles, MD ; Next Scheduled Visit: 01/28/24 Endocrinologist Dr. Aretha Hall; Next Scheduled Visit: 10/25/23  Medication Access/Adherence  Current Pharmacy:  [facility] - [facility] [address] [place] [address] Phone: [phone] Fax: [phone]  [facility] - [place], [place] - [address] [place] [address] Phone: [phone] Fax: [phone]  [facility] - [place], [place] - [address] [place] [address] Phone: [phone] Fax: [phone]  [id number] Jonette Nestle, [place] - [address] AT [facility] OF [address] [place] [address] Phone: [phone] Fax: [phone]  [facility] (New Address) - [place], [place] - [address] [facility] AT Previously: [address], [place] [address] [facility] [address] [place] [address] Phone: [phone] Fax: [phone]  [place] - [facility] [address], [address] [place] [address] Phone: [phone] Fax: [phone]   Patient reports affordability concerns with their medications: Yes  Patient reports access/transportation concerns to their pharmacy: No  Patient reports adherence concerns with their medications:  No     Diabetes: *Followed by endo - Dr. Aretha Hall Current medications: Xigduo  XR 04/999 mg daily, Mounjaro  10 mg weekly, Tresiba  160 units per day, Novolog   30 units TID AC (40 units prescribed by endo, but not taking this dose as he feels it is a lot of insulin ) - Takes Xigduo  3-4x/week - doesn't take daily since the big pill is uncomfortable - Takes Novolog  1.5-2x/day - having to carry pen & needles limits use; often takes at time of meals as it is difficult with his schedule to take before eating, but will take before meal at least 1x/day  *Previously wasn't seeing BG improvement with Novolog , but now noticing it.  Medications tried in the past: Mounjaro  12.5 mg caused GI upset  Current glucose readings: BG 125 currently Avg 7 day BG 211 Using Dexcom G7  Patient reports hypoglycemic s/sx when taking Novolog  without or after a meal.  - Has been working to take Novolog  before a meal but has had some instances where he took Novolog  and didn't get the chance to eat, resulting in low BG. - Took Novolog  after meal last night, felt jittery, and BG was ~50. Lows usually happen around mealtime, so he will eat a meal to treat it.  Patient reports hyperglycemic symptoms including polyuria  Current meal patterns:  Pt describes his diet as "terrible" because he is always eating on the go. Has tried to cut back on carbs and eat smaller portion but notes the overall food choices are not ideal. Pt also reports drinking regular sodas (Coke, Sprite, [employer]), at least 3 cans per day  Current physical activity: none outside of regular daily movement  Current medication access support: copay cards for Xigduo , Nexlizet , and Mounajro  Hypertension:  Current medications: candesartan /hydrochlorothiazide  32/25 mg daily (still taking) *PCP stopped candesartan /hydrochlorothiazide  and started candesartan  16 mg, indapamide  1.25 mg,  and amlodipine  5 mg at last visit 3/24 however pt has not changed yet because he wanted to finish his current supply  Patient does not have a validated, automated, upper arm home BP cuff Current blood pressure readings:  none  Objective:  Lab Results  Component Value Date   HGBA1C 8.9 (A) 10/25/2023    Lab Results  Component Value Date   CREATININE 0.90 09/23/2023   BUN 9 09/23/2023   NA 136 09/23/2023   K 3.8 09/23/2023   CL 100 09/23/2023   CO2 27 09/23/2023    Lab Results  Component Value Date   CHOL 149 09/23/2023   HDL 36.90 (L) 09/23/2023   LDLCALC 98 09/23/2023   LDLDIRECT 158.1 02/07/2012   TRIG 69.0 09/23/2023   CHOLHDL 4 09/23/2023    Medications Reviewed Today   Medications were not reviewed in this encounter       Assessment/Plan:   Diabetes: - Currently uncontrolled, A1c goal <7% - Reviewed long term cardiovascular and renal outcomes of uncontrolled blood sugar - Reviewed goal A1c, goal fasting, and goal 2 hour post prandial glucose - Reviewed dietary modifications including eliminating high sugar beverages such as sodas and juices, encouraged increased protein/fiber and balanced meals/snacks - Reviewed lifestyle modifications including: increased movement/walking - Reviewed proper use of insulin . Novolog  to be taken 10-15 minutes prior to eating, which may help prevent high BG spikes after eating and low BGs later on - Provided education on how to properly treat hypoglycemia - drink juice, check BG in 15 min, then repeat as needed - Recommend to continue current regimen, with focus on timing of Novolog  doses and adherence with Novolog  and Xigduo   Med access: Xigduo  copay card at [facility] [id number] PCN: [id number], GRP: [id number], ID: [id number]  Nexlizet  - already re-enrolled pt in copay card program, using same copay card as instructed  Appears Mounjaro  already has a copay card and his insulins are $0  Hypertension: - Currently uncontrolled, BP goal <130/80 - Reviewed long term cardiovascular and renal outcomes of uncontrolled blood pressure - Reviewed appropriate blood pressure monitoring technique and reviewed goal blood pressure. Recommended to check home blood  pressure and heart rate daily. Recommended pt get an arm BP monitor to check at home - Recommend to start amlodipine  as prescribed by PCP - Recommended to start taking indapamide  and candesartan  as prescribed by PCP once finishes supply of candesartan /hydrochlorothiazide   Follow Up Plan: 5/15  Abelina Abide, PharmD PGY1 Pharmacy Resident 11/07/2023 1:26 PM  Rainelle Bur, PharmD, BCPS, CPP Clinical Pharmacist Practitioner [facility] [phone]

## 2023-11-07 NOTE — Patient Instructions (Addendum)
 It was a pleasure speaking with you today!  Continue working on taking Novolog  15-20 minutes before each meal. Focus on trying to take Novolog  with each meal and taking Xigduo  every day. Start taking amlodipine .  Feel free to call with any questions or concerns!  Abelina Abide, PharmD PGY1 Pharmacy Resident 11/07/2023 1:32 PM  Rainelle Bur, PharmD, BCPS, CPP Clinical Pharmacist Practitioner [facility] [phone]

## 2023-11-10 ENCOUNTER — Encounter: Payer: Self-pay | Admitting: Internal Medicine

## 2023-11-12 ENCOUNTER — Other Ambulatory Visit (HOSPITAL_COMMUNITY): Payer: Self-pay | Admitting: General Surgery

## 2023-11-14 ENCOUNTER — Other Ambulatory Visit (INDEPENDENT_AMBULATORY_CARE_PROVIDER_SITE_OTHER): Admitting: Pharmacist

## 2023-11-14 DIAGNOSIS — Z794 Long term (current) use of insulin: Secondary | ICD-10-CM

## 2023-11-14 DIAGNOSIS — E1165 Type 2 diabetes mellitus with hyperglycemia: Secondary | ICD-10-CM

## 2023-11-14 MED ORDER — NOVOLOG FLEXPEN 100 UNIT/ML ~~LOC~~ SOPN: 30.0000 [IU] | PEN_INJECTOR | Freq: Three times a day (TID) | SUBCUTANEOUS | Status: DC

## 2023-11-14 NOTE — Progress Notes (Signed)
 11/14/2023 Name: Stephen Hall MRN: [id number] DOB: [birth year]  Chief Complaint  Patient presents with   Diabetes   Hypertension   Medication Management    Stephen Hall is a 43 y.o. year old male who presented for a telephone visit.   They were referred to the pharmacist by their PCP for assistance in managing diabetes and hypertension.   Subjective:  Care Team: Primary Care Provider: Arcadio Knuckles, MD ; Next Scheduled Visit: 01/28/24 Endocrinologist Dr. Aretha Kubas; Next Scheduled Visit: 02/14/24  Medication Access/Adherence  Current Pharmacy:  Melodee Spruce [facility] - [facility] [address] [place] [address] Phone: [phone] Fax: [phone]  [facility] - [place], [place] - [address] [place] [address] Phone: [phone] Fax: [phone]  [facility] - [place], [place] - [address] [place] [address] Phone: [phone] Fax: [phone]  [id number] Jonette Nestle, [place] - [address] AT [facility] [address] [place] [address] Phone: [phone] Fax: [phone]  [facility] (New Address) - [place], [place] - [address] AT Previously: [address], [place] [address] [place] [address] Phone: [phone] Fax: [phone]  [facility] [address], [address] [place] [address] Phone: [phone] Fax: [phone]   Patient reports affordability concerns with their medications: Yes  Patient reports access/transportation concerns to their pharmacy: No  Patient reports adherence concerns with their medications:  No    Diabetes: *Followed by endo - Dr. 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  - Has been having lows down to 50 twice daily after using Novolog  (in AM around 1130 and afternoon) - Lows usually happen around mealtime, so he will eat a meal to treat it.  Current meal patterns:  Pt describes his diet as "terrible" because he is always eating on the go. Has tried to cut back on carbs and eat smaller portion but notes the overall food choices are not ideal. Pt also reports drinking regular sodas (Coke, Sprite, [employer]), at least 3 cans per day  Eats 3-4 meals/day Breakfast - leftovers; meat, juice or soda Lunch - something similar to breakfast; adds tomato, lettuce, or something green; sweet tea Afternoon snack - whatever he can find Dinner - heartiest meal  Current physical activity: none outside of regular daily  movement  Current medication access support: copay cards for Xigduo , Nexlizet , and Mounjaro   Hypertension:  Current medications: candesartan /hydrochlorothiazide  32/25 mg daily (still taking) *PCP stopped candesartan /hydrochlorothiazide  and started candesartan  16 mg, indapamide  1.25 mg, and  amlodipine  5 mg at last visit 3/24 however pt has not changed yet because he wanted to finish his current supply  Patient does not have a validated, automated, upper arm home BP cuff as he notes he does not have the means to buy a cuff. Current blood pressure readings: none  Of note, patient went to consultation for weight loss surgery last Thursday on 5/8. Has appointments with many healthcare providers over the next few weeks.  Objective:  Lab Results  Component Value Date   HGBA1C 8.9 (A) 10/25/2023    Lab Results  Component Value Date   CREATININE 0.90 09/23/2023   BUN 9 09/23/2023   NA 136 09/23/2023   K 3.8 09/23/2023   CL 100 09/23/2023   CO2 27 09/23/2023    Lab Results  Component Value Date   CHOL 149 09/23/2023   HDL 36.90 (L) 09/23/2023   LDLCALC 98 09/23/2023   LDLDIRECT 158.1 02/07/2012   TRIG 69.0 09/23/2023   CHOLHDL 4 09/23/2023    Medications Reviewed Today     Reviewed by Marybelle Smiling, RPH (Pharmacist) on 11/14/23 at [phone]  Med List Status: <None>   Medication Order Taking? Sig Documenting Provider Last Dose Status Informant  albuterol  (VENTOLIN  HFA) 108 (90 Base) MCG/ACT inhaler [id number]  Inhale 2 puffs into the lungs every 6 (six) hours as needed for wheezing or shortness of breath. Mardene Shake, FNP  Active   amLODipine  (NORVASC ) 5 MG tablet [id number] No Take 1 tablet (5 mg total) by mouth daily.  Patient not taking: Reported on 11/14/2023   Arcadio Knuckles, MD Not Taking Active   Bempedoic Acid -Ezetimibe  (NEXLIZET ) 180-10 MG TABS [id number]  Take 1 tablet by mouth daily. Arcadio Knuckles, MD  Active   candesartan  (ATACAND ) 16 MG tablet [id number]  Take 1 tablet (16 mg total) by mouth daily. Arcadio Knuckles, MD  Active            Med Note Lolly Riser, Inetta Dicke R   Thu Oct 17, 2023  9:51 AM) Still taking candesartan /HCTZ  Continuous Glucose Sensor (DEXCOM G7 SENSOR) MISC [id number] Yes Change sensor every 10 days Thapa, [place], MD Taking Active    Dapagliflozin  Pro-metFORMIN  ER (XIGDUO  XR) 04-999 MG TB24 [id number] Yes Take 1 tablet by mouth daily. Thapa, [place], MD Taking Active   Glucagon  (GVOKE HYPOPEN  2-PACK) 1 MG/0.2ML [place] [id number]  Inject 1 pen into the skin daily as needed for low blood sugar Arcadio Knuckles, MD  Active   indapamide  (LOZOL ) 1.25 MG tablet [id number]  Take 1 tablet (1.25 mg total) by mouth daily. Arcadio Knuckles, MD  Active   insulin  aspart (NOVOLOG  FLEXPEN) 100 UNIT/ML FlexPen [id number] Yes Inject 40 Units into the skin 3 (three) times daily with meals. Thapa, [place], MD Taking Active   insulin  degludec (TRESIBA  FLEXTOUCH) 200 UNIT/ML FlexTouch Pen [id number] Yes Inject 160 Units into the skin daily. Thapa, [place], MD Taking Active   Insulin  Pen Needle (BD PEN NEEDLE MICRO U/F) 32G X 6 MM MISC [id number]  Use as directed morning, noon, in the evening and at bedtime. Thapa, [place], MD  Active   Lancets [facility] Jewelene Morton PLUS [place]) MISC [id number]  USE TO CHECK BLOOD GLUCOSE THREE TIMES DAILY Gwyndolyn Lerner, MD  Active   rosuvastatin  (CRESTOR ) 10  MG tablet [id number]  Take 1 tablet (10 mg total) by mouth daily. Arcadio Knuckles, MD  Active   sildenafil  (VIAGRA ) 100 MG tablet [id number]  Take 1 tablet (100 mg total) by mouth daily as needed for erectile dysfunction   Active   tadalafil  (CIALIS ) 5 MG tablet [id number]  Take 1 tablet (5 mg total) by mouth daily.   Active   thiamine  (VITAMIN B-1) 50 MG tablet [id number]  Take 1 tablet (50 mg total) by mouth daily. Arcadio Knuckles, MD  Active   tirzepatide  (MOUNJARO ) 10 MG/0.5ML Pen [id number] Yes Inject 10 mg into the skin once a week. Thapa, [place], MD Taking Active            Med Note Vicenta Graft, JESSICA H   Thu Nov 14, 2023  9:41 AM) Thursdays            Assessment/Plan:   Diabetes: - Currently uncontrolled, A1c goal <7% - Reviewed long term cardiovascular and renal outcomes of uncontrolled blood sugar - Reviewed goal A1c, goal fasting, and goal 2 hour post prandial  glucose - Reviewed dietary modifications including eliminating high sugar beverages such as sodas and juices, encouraged increased protein/fiber and balanced meals/snacks - Reviewed lifestyle modifications including: increased movement/walking - Reviewed proper use of insulin . Novolog  to be taken 10-15 minutes prior to eating, which may help prevent high BG spikes after eating and low BGs later on. - Provided education on how to properly treat hypoglycemia - drink juice, small amount of regular soda, or eat hard candies, check BG in 15 min, then repeat as needed - Recommend to decrease Novolog  dose to 30 units TID with meals - Recommend to continue focus on timing of Novolog  doses and adherence with Novolog  and Xigduo .  Med access: Xigduo  copay card at [facility] [address]010 PCN: CN, GRP: [id number], ID: [id number]  Nexlizet  - already re-enrolled pt in copay card program, using same copay card as instructed  Appears Mounjaro  already has a copay card and his insulins are $0  Hypertension: - Currently uncontrolled, BP goal <130/80 - Reviewed long term cardiovascular and renal outcomes of uncontrolled blood pressure - Office to provide patient with blood pressure cuff. - Recommended to start amlodipine  as prescribed by PCP - Recommended to start taking indapamide  and candesartan  as prescribed by PCP once finishes supply of candesartan /hydrochlorothiazide   The patient has a diagnosis of hypertension and it is medically necessary for them to have access to a home device to monitor blood pressure. The patient does not have readily available insurance access to a device and cannot afford to purchase a device at this time. The patient has been counseled that they do not need to continue to receive services from [facility] for receive a device. The patient will be given a device free of charge.     Follow Up Plan: 5/30  Abelina Abide, PharmD PGY1 Pharmacy Resident 11/14/2023 9:42 AM  Rainelle Bur,  PharmD, BCPS, CPP Clinical Pharmacist Practitioner [facility] [phone]

## 2023-11-14 NOTE — Patient Instructions (Addendum)
 It was a pleasure speaking with you today!  Decrease Novolog  to 30 units prior to meals. Contact us  sooner if you are still having frequent low blood sugars.  A blood pressure cuff has been left at the front desk for you to pick up. Begin checking blood pressure regularly and keep a log.   Feel free to call with any questions or concerns!  Rainelle Bur, PharmD, BCPS, CPP Clinical Pharmacist Practitioner [facility] [phone]

## 2023-11-16 ENCOUNTER — Other Ambulatory Visit (HOSPITAL_COMMUNITY): Payer: Self-pay

## 2023-11-18 ENCOUNTER — Other Ambulatory Visit (HOSPITAL_COMMUNITY): Payer: Self-pay

## 2023-11-20 ENCOUNTER — Other Ambulatory Visit (HOSPITAL_COMMUNITY): Payer: Self-pay

## 2023-11-22 ENCOUNTER — Encounter: Attending: Internal Medicine | Admitting: Dietician

## 2023-11-22 ENCOUNTER — Encounter: Payer: Self-pay | Admitting: Dietician

## 2023-11-22 VITALS — Ht 77.0 in | Wt 347.0 lb

## 2023-11-22 DIAGNOSIS — E669 Obesity, unspecified: Secondary | ICD-10-CM | POA: Diagnosis present

## 2023-11-22 DIAGNOSIS — E119 Type 2 diabetes mellitus without complications: Secondary | ICD-10-CM | POA: Diagnosis present

## 2023-11-22 NOTE — Progress Notes (Signed)
 Nutrition Assessment for Bariatric Surgery: Pre-Surgery Behavioral and Nutrition Intervention Program   Medical Nutrition Therapy  Appt Start Time: 1038    End Time: 1155  Patient was seen on 11/22/2023 for Pre-Operative Nutrition Assessment. Purpose of todays visit  enhance perioperative outcomes along with a healthy weight maintenance   Referral stated Supervised Weight Loss (SWL) visits needed: 12  Planned surgery: Sleeve Gastrectomy Pt expectation of surgery: feel better weighing 100 lbs less  NUTRITION ASSESSMENT   Anthropometrics  Start weight at [facility]: 347.0 lbs (date: 11/22/2023)  Height: 77 in BMI: 41.15 kg/m2     Clinical   Pharmacotherapy: History of weight loss medication used: Mounjaro  (tolerates well), saxenda  (not tolerated)  Medical hx: T2DM, HTN, High Cholesterol, obesity, sleep apnea Medications: monjauro, metformin , novolog , tresiba , candesartan , rosuvastatin , vit B Labs: Vit D 14.4; iron 35; iron saturation <8; UIBC 390 Notable signs/symptoms: none noted Any previous deficiencies? No  Evaluation of Nutritional Deficiencies: Micronutrient Nutrition Focused Physical Exam: Hair: No issues observed Eyes: No issues observed Mouth: No issues observed Neck: No issues observed Nails: No issues observed Skin: No issues observed  Lifestyle & Dietary Hx  Pt states he feels the effects of Mounjaro , stating he hasn't lost weight, but thinks he may be losing muscle mass.  Current Physical Activity Recommendations state 150 minutes per week of moderate to vigorous movement including Cardio and 1-2 days of resistance activities as well as flexibility/balance activities:  Pts current physical activity: plays sports with son, and play ball with a group of guys once a week, with 20% recommendation reached    Sleep Hygiene: duration and quality: pt states he has a CPAP machine; sleep not great, stating he wakes in the night, stating he doesn't sleep deep or fully rested  when waking in the morning.  Current Patient Perceived Stress Level as stated by pt on a scale of 1-10:  3-4       Stress Management Techniques: push through, hope it is over soon  According to the Dietary Guidelines for Americans Recommendation: equivalent 1.5-2 cups fruits per day, equivalent 2-3 cups vegetables per day and at least half all grains whole  Fruit servings per day (on average): 0, meeting 0% recommendation  Non-starchy vegetable servings per day (on average): 1, meeting 33-50% recommendation  Whole Grains per day (on average): 0  Number of meals missed/skipped per week out of 21: 0  24-Hr Dietary Recall First Meal: biscuit (sausage), Dr. Kathlene Paradise Snack:  Second Meal: water, green beans fried chicken Snack:  Third Meal: chick fil a sandwich Snack: popcorn, soda Beverages: soda, juice, water  Alcoholic beverages per week: 0   Estimated Energy Needs Calories: 1900  NUTRITION DIAGNOSIS  Overweight/obesity ([place]-3.3) related to past poor dietary habits and physical inactivity as evidenced by patient w/ planned sleeve surgery following dietary guidelines for continued weight loss.  NUTRITION INTERVENTION  Nutrition counseling (C-1) and education (E-2) to facilitate bariatric surgery goals.  Educated pt on micronutrient deficiencies post-surgery and behavioral/dietary strategies to start in order to mitigate that risk  Behavioral and Dietary Interventions Pre-Op Goals Reviewed with the Patient Nutrition: Healthy Eating Behaviors Switch to non-caloric, non-carbonated and non-caffeinated beverages such as  water, unsweetened tea, Crystal Light and zero calorie beverages (aim for 64 oz. per day) Cut out grazing between meals or at night  Find a protein shake you like Eat every 3-5 hours        Eliminate distractions while eating (TV, computer, reading, driving, texting) Take 09-81 minutes to  eat a meal  Decrease high sugar foods/decrease high fat/fried foods Eliminate  alcoholic beverages Increase protein intake (eggs, fish, chicken, yogurt) before surgery Eat non starchy vegetables 2 times a day 7 days a week Eat complex carbohydrates such as whole grains and fruits   Behavioral Modification: Physical Activity Increase my usual daily activity (use stairs, park farther, etc.) Engage in _______________________  activity  _______ minutes ______ times per week  Other:    _________________________________________________________________     Problem Solving I will think about my usual eating patterns and how to tweak them How can my friends and family support me Barriers to starting my changes Learn and understand appetite verses hunger   Healthy Coping Allow for ___________ activities per week to help me manage stress Reframe negative thoughts I will keep a picture of someone or something that is my inspiration & look at it daily   Monitoring  Weigh myself once a week  Measure my progress by monitoring how my clothes fit Keep a food record of what I eat and drink for the next ________ (time period) Take pictures of what I eat and drink for the next ________ (time period) Use an app to count steps/day for the next_______ (time period) Measure my progress such as increased energy and more restful sleep Monitor your acid reflux and bowel habits, are they getting better?   *Goals that are bolded indicate the pt would like to start working towards these  Handouts Provided Include  Bariatric Surgery handouts (Nutrition Visits, Pre Surgery Behavioral Change Goals, Protein Shakes Brands to Choose From, Vitamins & Mineral Supplementation)  Learning Style & Readiness for Change Teaching method utilized: Visual, Auditory, and hands on  Demonstrated degree of understanding via: Teach Back  Readiness Level: preparation Barriers to learning/adherence to lifestyle change: nothing identified  RD's Notes for Next Visit Patient progress toward chosen goals    MONITORING & EVALUATION Dietary intake, weekly physical activity, body weight, and preoperative behavioral change goals   Next Steps  Patient is to follow up at [facility] in 2-4 weeks for first SWL visit.

## 2023-11-29 ENCOUNTER — Other Ambulatory Visit

## 2023-12-03 ENCOUNTER — Ambulatory Visit (HOSPITAL_COMMUNITY)
Admission: RE | Admit: 2023-12-03 | Discharge: 2023-12-03 | Disposition: A | Discharge status: Home / Self Care | Source: Ambulatory Visit | Attending: General Surgery | Admitting: General Surgery

## 2023-12-03 ENCOUNTER — Ambulatory Visit (HOSPITAL_COMMUNITY)
Admission: RE | Admit: 2023-12-03 | Discharge: 2023-12-03 | Disposition: A | Discharge status: Home / Self Care | Source: Ambulatory Visit | Attending: General Surgery

## 2023-12-04 ENCOUNTER — Other Ambulatory Visit (HOSPITAL_COMMUNITY)

## 2023-12-16 ENCOUNTER — Other Ambulatory Visit (INDEPENDENT_AMBULATORY_CARE_PROVIDER_SITE_OTHER): Admitting: Pharmacist

## 2023-12-16 DIAGNOSIS — Z794 Long term (current) use of insulin: Secondary | ICD-10-CM

## 2023-12-16 DIAGNOSIS — I10 Essential (primary) hypertension: Secondary | ICD-10-CM

## 2023-12-16 DIAGNOSIS — E1165 Type 2 diabetes mellitus with hyperglycemia: Secondary | ICD-10-CM

## 2023-12-16 NOTE — Patient Instructions (Signed)
 It was a pleasure speaking with you today!  Continue your current regimen. Focus on taking Xigduo  more regularly and take Novolog  10-15 minutes prior to meals.  Feel free to call with any questions or concerns!  Rainelle Bur, PharmD, BCPS, CPP Clinical Pharmacist Practitioner [facility] [phone]

## 2023-12-16 NOTE — Progress Notes (Signed)
 12/16/2023 Name: Stephen Hall MRN: [id number] DOB: 1980/10/06  Chief Complaint  Patient presents with   Diabetes   Hypertension   Medication Management    Stephen Hall is a 43 y.o. year old male who presented for a telephone visit.   They were referred to the pharmacist by their PCP for assistance in managing diabetes and hypertension.   Subjective:  Care Team: Primary Care Provider: Arcadio Knuckles, MD ; Next Scheduled Visit: 01/28/24 Endocrinologist Dr. Aretha Kubas; Next Scheduled Visit: 02/14/24  Medication Access/Adherence  Current Pharmacy:  Melodee Spruce LONG - [facility] [address] [place] [address] Phone: [phone] Fax: [phone]  [facility] - [place], [place] - [address] [place] [address] Phone: [phone] Fax: [phone]  [facility] - [place], [place] - [address] [place] [address] Phone: [phone] Fax: [phone]  [facility][id number] [place]ette Nestle, [place] - [address] AT [facility] OF [address] [place] [address] Phone: [phone] Fax: [phone]  [facility] (New Address) - [place], [place] - [address] [facility] AT Previously: Alveda Aures, [place] [address] [facility] [address] 2 4th Floor [address] [place] [address] Phone: [phone] Fax: [phone]  [place] - [facility] [address], [address] [place] [address] Phone: [phone] Fax: [phone]   Patient reports affordability concerns with their medications: Yes  Patient reports access/transportation concerns to their pharmacy: No  Patient reports adherence concerns with their medications:  No    Diabetes: *Followed by endo - Dr. 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  - Had a low down to 52 yesterday because he took Novolog  prior to leaving for lunch and the restaurant was 30 minutes away  Current meal patterns:  Pt describes his diet as terrible because he is always eating on the go. Has tried to cut back on carbs and eat smaller portion but notes the overall food choices are not ideal. Pt also reports drinking regular sodas (Coke, Sprite, [employer]), at least 3 cans per day  Eats 3-4 meals/day Breakfast - leftovers; meat, juice or soda Lunch - something similar to breakfast; adds tomato, lettuce, or something green; sweet tea Afternoon snack - whatever he can find Dinner - heartiest meal  Current physical activity: none outside of regular daily movement  Current medication access support: copay cards for Xigduo , Nexlizet , and Mounjaro   Hypertension: Current medications: candesartan  16 mg daily, amlodipine  5 mg daily *PCP stopped candesartan /hydrochlorothiazide  and started candesartan  16 mg, indapamide  1.25 mg, and amlodipine  5 mg at last visit 3/24 however pt has not changed yet because he wanted to finish his current  supply  Patient does have a validated, automated, upper arm home BP cuff. Current blood pressure readings: 130-140/80s  Of note, patient is going through the process to get approval for weight loss surgery   Objective:  Lab  Results  Component Value Date   HGBA1C 8.9 (A) 10/25/2023    Lab Results  Component Value Date   CREATININE 0.90 09/23/2023   BUN 9 09/23/2023   NA 136 09/23/2023   K 3.8 09/23/2023   CL 100 09/23/2023   CO2 27 09/23/2023    Lab Results  Component Value Date   CHOL 149 09/23/2023   HDL 36.90 (L) 09/23/2023   LDLCALC 98 09/23/2023   LDLDIRECT 158.1 02/07/2012   TRIG 69.0 09/23/2023   CHOLHDL 4 09/23/2023    Medications Reviewed Today     Reviewed by Dion Frankel, RPH (Pharmacist) on 12/16/23 at 1539  Med List Status: <None>   Medication Order Taking? Sig Documenting Provider Last Dose Status Informant  albuterol  (VENTOLIN  HFA) 108 (90 Base) MCG/ACT inhaler [id number]  Inhale 2 puffs into the lungs every 6 (six) hours as needed for wheezing or shortness of breath. Mardene Shake, FNP  Active   amLODipine  (NORVASC ) 5 MG tablet [id number] Yes Take 1 tablet (5 mg total) by mouth daily. Arcadio Knuckles, MD  Active   Bempedoic Acid -Ezetimibe  (NEXLIZET ) 180-10 MG TABS [id number]  Take 1 tablet by mouth daily. Arcadio Knuckles, MD  Active   candesartan  (ATACAND ) 16 MG tablet [id number] Yes Take 1 tablet (16 mg total) by mouth daily. Arcadio Knuckles, MD  Active            Med Note Josette Nielsen Dec 16, 2023  3:39 PM)    Continuous Glucose Sensor (DEXCOM G7 SENSOR) [place] [id number] Yes Change sensor every 10 days Thapa, [place], MD  Active   Dapagliflozin  Pro-metFORMIN  ER (XIGDUO  XR) 04-999 MG TB24 [id number] Yes Take 1 tablet by mouth daily. Thapa, [place], MD  Active   Glucagon  (GVOKE HYPOPEN  2-PACK) 1 MG/0.2ML [place] [id number]  Inject 1 pen into the skin daily as needed for low blood sugar Arcadio Knuckles, MD  Active   indapamide  (LOZOL ) 1.25 MG tablet [id number]  Take 1 tablet (1.25 mg total) by mouth daily.  Patient not taking: Reported on 12/16/2023   Arcadio Knuckles, MD  Active   insulin  aspart (NOVOLOG  FLEXPEN) 100 UNIT/ML FlexPen [id number] Yes Inject 30 Units into the skin  3 (three) times daily with meals. Arcadio Knuckles, MD  Active   insulin  degludec (TRESIBA  FLEXTOUCH) 200 UNIT/ML FlexTouch Pen [id number] Yes Inject 160 Units into the skin daily. Thapa, [place], MD  Active   Insulin  Pen Needle (BD PEN NEEDLE MICRO U/F) 32G X 6 MM MISC [id number]  Use as directed morning, noon, in the evening and at bedtime. Thapa, [place], MD  Active   Lancets [facility] DELICA PLUS [place]) MISC [id number]  USE TO CHECK BLOOD GLUCOSE THREE TIMES DAILY Gwyndolyn Lerner, MD  Active   rosuvastatin  (CRESTOR ) 10 MG tablet [id number]  Take 1 tablet (10 mg total) by mouth daily. Arcadio Knuckles, MD  Active   sildenafil  (VIAGRA ) 100 MG tablet [id number]  Take 1 tablet (100 mg total) by mouth daily as needed for erectile dysfunction   Active   tadalafil  (CIALIS ) 5 MG tablet [id number]  Take 1 tablet (5 mg total) by mouth daily.   Active   thiamine  (VITAMIN B-1) 50 MG tablet [id number]  Take 1 tablet (50  mg total) by mouth daily. Arcadio Knuckles, MD  Active   tirzepatide  (MOUNJARO ) 10 MG/0.5ML Pen [id number] Yes Inject 10 mg into the skin once a week. Thapa, [place], MD  Active            Med Note Vicenta Graft, JESSICA H   Thu Nov 14, 2023  9:41 AM) Thursdays            Assessment/Plan:   Diabetes: - Currently uncontrolled, A1c goal <7% - Reviewed long term cardiovascular and renal outcomes of uncontrolled blood sugar - Reviewed goal A1c, goal fasting, and goal 2 hour post prandial glucose - Reviewed dietary modifications including eliminating high sugar beverages such as sodas and juices, encouraged increased protein/fiber and balanced meals/snacks - Reviewed lifestyle modifications including: increased movement/walking - Reviewed proper use of insulin . Novolog  to be taken 10-15 minutes prior to eating, which may help prevent high BG spikes after eating and low BGs later on. - Recommend to continue Novolog  30 units TID with meals and Tresiba , Mounjaro  - Recommend to continue focus on timing of  Novolog  doses and adherence with Novolog  and Xigduo .  Med access: Xigduo  copay card at [facility] [id number] PCN: CN, GRP: [id number], ID: [id number]  Nexlizet  - already re-enrolled pt in copay card program, using same copay card as instructed  Appears Mounjaro  already has a copay card and his insulins are $0  Hypertension: - Currently uncontrolled, BP goal <130/80 - Reviewed long term cardiovascular and renal outcomes of uncontrolled blood pressure - Recommended to start amlodipine  as prescribed by PCP - Recommended to start taking indapamide  and candesartan  as prescribed by PCP along with amlodipine  - May need to increase candesartan  to 32 mg if BP remains elevated   Follow Up Plan: 6/30   Rainelle Bur, PharmD, BCPS, CPP Clinical Pharmacist Practitioner [facility] [phone]

## 2023-12-17 ENCOUNTER — Encounter: Attending: General Surgery | Admitting: Dietician

## 2023-12-17 ENCOUNTER — Encounter: Payer: Self-pay | Admitting: Dietician

## 2023-12-17 VITALS — Ht 77.0 in | Wt 333.8 lb

## 2023-12-17 DIAGNOSIS — E669 Obesity, unspecified: Secondary | ICD-10-CM | POA: Insufficient documentation

## 2023-12-17 NOTE — Progress Notes (Signed)
 Supervised Weight Loss Visit Bariatric Nutrition Education Appt Start Time: [phone]    End Time: 0915  Planned surgery: Sleeve Gastrectomy Pt expectation of surgery: feel better weighing 100 lbs less  Referral stated Supervised Weight Loss (SWL) visits needed: 12  1 out of 12 SWL Appointments   NUTRITION ASSESSMENT Anthropometrics  Start weight at [facility]: 347.0 lbs (date: 11/22/2023)  Height: 77 in Weight: 333.8 lbs BMI: 39.58 kg/m2     Clinical   Pharmacotherapy: History of weight loss medication used: Mounjaro  (tolerates well), saxenda  (not tolerated)  Medical hx: T2DM, HTN, High Cholesterol, obesity, sleep apnea Medications: monjauro, metformin , novolog , tresiba , candesartan , rosuvastatin , vit B Labs: Vit D 14.4; iron 35; iron saturation <8; UIBC 390 Notable signs/symptoms: none noted Any previous deficiencies? No  Lifestyle & Dietary Hx  Pt states he has been drinking zero sugar soda since last visit, stating he has not been drinking many of them. Pt states there are two days out of the week (a couple of days after the weekly shot) that he is miserable eating or drinking while on Mounjaro . Pt states it is effective, stating it works to suppress the his appetite.  Estimated daily fluid intake: 64 oz Supplements: Vit B Current average weekly physical activity: play basketball with son.  24-Hr Dietary Recall First Meal: biscuit (sausage), Dr. Kathlene Paradise zero Snack:  Second Meal: water, green beans fried chicken Snack:  Third Meal: chick fil a sandwich Snack: popcorn, soda Beverages: soda, juice, water  Alcoholic beverages per week: 0  Estimated Energy Needs Calories: 1900  NUTRITION DIAGNOSIS  Overweight/obesity ([place]-3.3) related to past poor dietary habits and physical inactivity as evidenced by patient w/ planned sleeve surgery following dietary guidelines for continued weight loss.  NUTRITION INTERVENTION  Nutrition counseling (C-1) and education (E-2) to facilitate  bariatric surgery goals.  Vitamin D  supplementation is crucial before bariatric surgery due to the high prevalence of deficiency in individuals with obesity. Low vitamin D  levels can worsen after surgery, potentially leading to complications like metabolic bone disease. Pre-operative supplementation helps optimize vitamin D  levels, mitigating these risks and improving overall health outcomes.   Tracking fluid intake is a crucial part of preparing for bariatric surgery. Adequate hydration before surgery helps ensure safety and can contribute to a smoother recovery. Staying hydrated helps maintain overall health and supports bodily functions during the pre-operative liquid diet phase. It's recommended to aim for at least 64 ounces of fluids daily, such as water, broth, or approved sugar-free beverages. Monitoring your intake and understanding the signs of dehydration, like dark urine or dizziness, is important to prevent complications and ensure you are well-prepared for your procedure.  Pre-Op Goals Reviewed with the Patient  Pre-Op Goals Progress & New Goals New: start taking vitamin D ; take 5,000 international units per day. New: track fluids; aim for 64 oz of hydrating fluids Continue: find a protein shake you like Continue: cut out grazing between meals and at night.  Handouts Provided Include  Bariatric MyPlate  Learning Style & Readiness for Change Teaching method utilized: Visual & Auditory  Demonstrated degree of understanding via: Teach Back  Readiness Level: preparation Barriers to learning/adherence to lifestyle change: nothing identified  RD's Notes for next Visit  Patient progress toward chosen goals  MONITORING & EVALUATION Dietary intake, weekly physical activity, body weight, and pre-op goals.  Next Steps  Patient is to return to [facility] in 2 for next SWL visit.

## 2023-12-19 ENCOUNTER — Other Ambulatory Visit (HOSPITAL_COMMUNITY): Payer: Self-pay

## 2023-12-19 ENCOUNTER — Telehealth: Payer: Self-pay

## 2023-12-19 ENCOUNTER — Encounter: Payer: Self-pay | Admitting: Endocrinology

## 2023-12-19 ENCOUNTER — Other Ambulatory Visit: Payer: Self-pay | Admitting: Endocrinology

## 2023-12-19 DIAGNOSIS — E1165 Type 2 diabetes mellitus with hyperglycemia: Secondary | ICD-10-CM

## 2023-12-19 MED ORDER — DEXCOM G7 SENSOR MISC
1.0000 | 3 refills | Status: DC
  Filled 2023-12-19: qty 3, 30d supply, fill #0
  Filled 2024-02-10: qty 3, 30d supply, fill #1
  Filled 2024-04-02: qty 3, 30d supply, fill #2
  Filled 2024-04-29: qty 3, 30d supply, fill #3

## 2023-12-19 NOTE — Telephone Encounter (Signed)
 Refill request complete

## 2023-12-19 NOTE — Telephone Encounter (Signed)
 PA needed for Dexcom G7.

## 2023-12-20 ENCOUNTER — Other Ambulatory Visit (HOSPITAL_COMMUNITY): Payer: Self-pay

## 2023-12-20 LAB — HM DIABETES EYE EXAM

## 2023-12-23 ENCOUNTER — Other Ambulatory Visit (HOSPITAL_COMMUNITY): Payer: Self-pay

## 2023-12-23 ENCOUNTER — Telehealth: Payer: Self-pay

## 2023-12-23 NOTE — Telephone Encounter (Signed)
 Pharmacy Patient Advocate Encounter   Received notification from Pt Calls Messages that prior authorization for Dexcom G7 sensor is required/requested.   Insurance verification completed.   The patient is insured through [facility] .   Per test claim: PA required; PA submitted to above mentioned insurance via CoverMyMeds Key/confirmation #/EOC [facility] Status is pending

## 2023-12-24 ENCOUNTER — Other Ambulatory Visit (HOSPITAL_COMMUNITY): Payer: Self-pay

## 2023-12-24 NOTE — Telephone Encounter (Signed)
 Pharmacy Patient Advocate Encounter  Received notification from [facility] that Prior Authorization for Dexcom G7 Sensor has been APPROVED from 12-23-2023 to 12-22-2024   PA #/Case ID/Reference #: [id number]

## 2023-12-30 ENCOUNTER — Other Ambulatory Visit (INDEPENDENT_AMBULATORY_CARE_PROVIDER_SITE_OTHER)

## 2023-12-30 DIAGNOSIS — E1165 Type 2 diabetes mellitus with hyperglycemia: Secondary | ICD-10-CM

## 2023-12-30 DIAGNOSIS — Z794 Long term (current) use of insulin: Secondary | ICD-10-CM

## 2023-12-30 DIAGNOSIS — I10 Essential (primary) hypertension: Secondary | ICD-10-CM

## 2023-12-30 NOTE — Patient Instructions (Signed)
 It was a pleasure speaking with you today!  Focus on taking Xigduo  daily and taking Novolog  consistently BEFORE meals.   Make sure you are prioritizing protein, fiber, and hydration.  Feel free to call with any questions or concerns!  [name], PharmD, BCPS, CPP Clinical Pharmacist Practitioner [facility] [phone]

## 2023-12-30 NOTE — Progress Notes (Signed)
 12/30/2023 Name: Stephen Hall MRN: [id number] DOB: 11/26/1980  Chief Complaint  Patient presents with   Diabetes   Hypertension   Medication Management    Stephen Hall is a 43 y.o. year old male who presented for a telephone visit.   They were referred to the pharmacist by their PCP for assistance in managing diabetes and hypertension.   Subjective:  Care Team: Primary Care Provider: Joshua Debby CROME, MD ; Next Scheduled Visit: 01/28/24 Endocrinologist Dr. Mercie; Next Scheduled Visit: 02/14/24  Medication Access/Adherence  Current Pharmacy:  DARRYLE LONG - [facility] [address] [place] [address] Phone: [phone] Fax: [phone]  [facility] - [place], [place] - [address] [place] [address] Phone: [phone] Fax: [phone]  [facility] - [place], [place] - [address] [place] [address] Phone: [phone] Fax: [phone]  [facility][id number] [place], [place] - [address] AT [facility] OF [address] [place] [address] Phone: [phone] Fax: [phone]  [facility] (New Address) - [place], [place] - [address] [facility] AT Previously: Viviana Mulligan, [place] [address] [facility] [address] 2 4th Floor [address] [place] [address] Phone: [phone] Fax: [phone]   - [facility] [address] [place] [address] Phone: [phone] Fax: [phone]   Patient reports affordability concerns with their medications: Yes  Patient reports access/transportation concerns to their pharmacy: No  Patient reports adherence concerns with their medications:  No    Diabetes: *Followed by endo - Dr. 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  - Average BG 2 weeks ago was 186. He was without his Dexcom sensor for 4 days last week. Did have a significant low Saturday overnight.  Current meal patterns:  Pt describes his diet as terrible because he is always eating on the go. Has tried to cut back on carbs and eat smaller portion but notes the overall food choices are not ideal.   Eats 3-4 meals/day Breakfast - leftovers; meat, juice or soda Lunch - something similar to breakfast; adds tomato, lettuce, or something green; sweet tea Afternoon snack - whatever he can find Dinner - heartiest meal  Current physical activity: none outside of regular daily movement  Current medication access support: copay cards for Xigduo , Nexlizet , and Mounjaro   Hypertension: Current medications: candesartan  16 mg daily, amlodipine  5 mg daily, indapamide  1.25 mg daily  Patient does have a validated, automated, upper arm home BP cuff. Current blood pressure readings: 135-137/80-85  Of note, patient is going through the process to get approval for weight loss surgery   Objective:  Lab Results  Component Value Date   HGBA1C 8.9 (A) 10/25/2023    Lab Results  Component Value Date   CREATININE 0.90 09/23/2023   BUN 9 09/23/2023   NA 136 09/23/2023   K 3.8 09/23/2023   CL 100 09/23/2023   CO2 27 09/23/2023    Lab  Results  Component Value Date   CHOL 149 09/23/2023   HDL 36.90 (L) 09/23/2023   LDLCALC 98 09/23/2023   LDLDIRECT 158.1 02/07/2012   TRIG 69.0 09/23/2023   CHOLHDL 4 09/23/2023    Medications Reviewed Today     Reviewed by Merceda Lela SAUNDERS, RPH (Pharmacist) on 12/30/23 at [phone]  Med List Status: <None>   Medication Order Taking? Sig Documenting Provider Last Dose Status Informant  albuterol  (VENTOLIN  HFA) 108 (90 Base) MCG/ACT inhaler [id number]  Inhale 2 puffs into the lungs every 6 (six) hours as needed for wheezing or shortness of breath. Kennyth Domino, FNP  Active   amLODipine  (NORVASC ) 5 MG tablet [id number] Yes Take 1 tablet (5 mg total) by mouth daily. Joshua Debby CROME, MD  Active   Bempedoic Acid -Ezetimibe  (NEXLIZET ) 180-10 MG TABS [id number]  Take 1 tablet by mouth daily. Joshua Debby CROME, MD  Active   candesartan  (ATACAND ) 16 MG tablet [id number] Yes Take 1 tablet (16 mg total) by mouth daily. Joshua Debby CROME, MD  Active            Med Note VIOLETTA LELA SAUNDERS Pablo Dec 16, 2023  3:39 PM)    Continuous Glucose Sensor (DEXCOM G7 SENSOR) [place] [id number] Yes Change sensor every 10 days Thapa, [place], MD  Active   Dapagliflozin  Pro-metFORMIN  ER (XIGDUO  XR) 04-999 MG TB24 [id number] Yes Take 1 tablet by mouth daily. Thapa, [place], MD  Active   Glucagon  (GVOKE HYPOPEN  2-PACK) 1 MG/0.2ML [place] [id number]  Inject 1 pen into the skin daily as needed for low blood sugar Joshua Debby CROME, MD  Active   indapamide  (LOZOL ) 1.25 MG tablet [id number] Yes Take 1 tablet (1.25 mg total) by mouth daily. Joshua Debby CROME, MD  Active   insulin  aspart (NOVOLOG  FLEXPEN) 100 UNIT/ML FlexPen [id number] Yes Inject 30 Units into the skin 3 (three) times daily with meals. Joshua Debby CROME, MD  Active   insulin  degludec (TRESIBA  FLEXTOUCH) 200 UNIT/ML FlexTouch Pen [id number] Yes Inject 160 Units into the skin daily. Thapa, [place], MD  Active   Insulin  Pen Needle (BD PEN NEEDLE MICRO U/F) 32G X 6 MM MISC [id number]  Use as  directed morning, noon, in the evening and at bedtime. Thapa, [place], MD  Active   Lancets [facility] DELICA PLUS [place]) MISC [id number]  USE TO CHECK BLOOD GLUCOSE THREE TIMES DAILY Kassie Mallick, MD  Active   rosuvastatin  (CRESTOR ) 10 MG tablet [id number]  Take 1 tablet (10 mg total) by mouth daily. Joshua Debby CROME, MD  Active   sildenafil  (VIAGRA ) 100 MG tablet [id number]  Take 1 tablet (100 mg total) by mouth daily as needed for erectile dysfunction   Active   tadalafil  (CIALIS ) 5 MG tablet [id number]  Take 1 tablet (5 mg total) by mouth daily.   Active   thiamine  (VITAMIN B-1) 50 MG tablet [id number]  Take 1 tablet (50 mg total) by mouth daily. Joshua Debby CROME, MD  Active   tirzepatide  (MOUNJARO ) 10 MG/0.5ML Pen [id number] Yes Inject 10 mg into the skin once a week. Thapa, [place], MD  Active            Med Note (  LAWTON HARLENE VEAR [place] Nov 14, 2023  9:41 AM) Thursdays            Assessment/Plan:   Diabetes: - Currently uncontrolled, A1c goal <7% - Reviewed long term cardiovascular and renal outcomes of uncontrolled blood sugar - Reviewed goal A1c, goal fasting, and goal 2 hour post prandial glucose - Reviewed dietary modifications including eliminating high sugar beverages such as sodas and juices, encouraged increased protein/fiber and balanced meals/snacks - Reviewed lifestyle modifications including: increased movement/walking, strength training as able - Reviewed proper use of insulin . Novolog  to be taken 10-15 minutes prior to eating, which may help prevent high BG spikes after eating and low BGs later on. - Recommend to continue Novolog  30 units TID with meals. May need to increase to 35 units while on vacation if BG are staying >200. Continue Tresiba , Mounjaro  - Recommend to continue focus on timing of Novolog  doses and adherence with Novolog  and Xigduo .  Med access: Xigduo  copay card at [facility] [id number] PCN: CN, GRP: [id number], ID: [id number]  Nexlizet  - already re-enrolled pt  in copay card program, using same copay card as instructed  Appears Mounjaro  already has a copay card and his insulins are $0  Hypertension: - Currently uncontrolled, BP goal <130/80 - average has improved to the 130/80s - Reviewed long term cardiovascular and renal outcomes of uncontrolled blood pressure - Recommended to continue amlodipine , indapamide  and candesartan   - May need to increase candesartan  to 32 mg if BP remains elevated   Follow Up Plan: 7/14   [name], PharmD, BCPS, CPP Clinical Pharmacist Practitioner [facility] at [facility] [phone]

## 2023-12-31 ENCOUNTER — Encounter: Attending: General Surgery | Admitting: Dietician

## 2023-12-31 ENCOUNTER — Encounter: Payer: Self-pay | Admitting: Dietician

## 2023-12-31 VITALS — Ht 77.0 in | Wt 332.6 lb

## 2023-12-31 DIAGNOSIS — E669 Obesity, unspecified: Secondary | ICD-10-CM | POA: Insufficient documentation

## 2023-12-31 NOTE — Progress Notes (Signed)
 Supervised Weight Loss Visit Bariatric Nutrition Education Appt Start Time: 1418    End Time: 1447  Planned surgery: Sleeve Gastrectomy Pt expectation of surgery: feel better weighing 100 lbs less  Referral stated Supervised Weight Loss (SWL) visits needed: 12  2 out of 12 SWL Appointments   NUTRITION ASSESSMENT Anthropometrics  Start weight at [facility]: 347.0 lbs (date: 11/22/2023)  Height: 77 in Weight: 332.6 lbs BMI: 39.44 kg/m2     Clinical   Pharmacotherapy: History of weight loss medication used: Mounjaro  (tolerates well), saxenda  (not tolerated)  Medical hx: T2DM, HTN, High Cholesterol, obesity, sleep apnea Medications: monjauro, metformin , novolog , tresiba , candesartan , rosuvastatin , vit B Labs: Vit D 14.4; iron 35; iron saturation <8; UIBC 390 Notable signs/symptoms: none noted Any previous deficiencies? No  Lifestyle & Dietary Hx  Pt states he has to be very intentional to get 64 ounces of fluids. Pt states he and is wife is getting [employer] meals, stating, they can customize them to have more protein. Pt states he has just been eating protein and neglecting non-starchy vegetables and complex carbohydrates.  Estimated daily fluid intake: 64 oz Supplements: Vit B Current average weekly physical activity: play basketball with son.  24-Hr Dietary Recall First Meal: biscuit (sausage), Dr. Nunzio zero Snack:  Second Meal: water, green beans fried chicken Snack:  Third Meal: chick fil a sandwich Snack: popcorn, soda Beverages: soda, juice, water  Alcoholic beverages per week: 0  Estimated Energy Needs Calories: 1900  NUTRITION DIAGNOSIS  Overweight/obesity ([place]-3.3) related to past poor dietary habits and physical inactivity as evidenced by patient w/ planned sleeve surgery following dietary guidelines for continued weight loss.  NUTRITION INTERVENTION  Nutrition counseling (C-1) and education (E-2) to facilitate bariatric surgery goals.  Vitamin D   supplementation is crucial before bariatric surgery due to the high prevalence of deficiency in individuals with obesity. Low vitamin D  levels can worsen after surgery, potentially leading to complications like metabolic bone disease. Pre-operative supplementation helps optimize vitamin D  levels, mitigating these risks and improving overall health outcomes.   Tracking fluid intake is a crucial part of preparing for bariatric surgery. Adequate hydration before surgery helps ensure safety and can contribute to a smoother recovery. Staying hydrated helps maintain overall health and supports bodily functions during the pre-operative liquid diet phase. It's recommended to aim for at least 64 ounces of fluids daily, such as water, broth, or approved sugar-free beverages. Monitoring your intake and understanding the signs of dehydration, like dark urine or dizziness, is important to prevent complications and ensure you are well-prepared for your procedure.  For individuals who have undergone or about to have bariatric surgery, avoiding liquids with meals is a crucial dietary modification. The drastically reduced stomach size means that space is extremely limited. Drinking alongside food can fill this small pouch quickly, leaving insufficient room for nutrient-dense solids, which are essential for satiety and preventing deficiencies. Furthermore, consuming liquids with meals can prematurely flush food through the stomach, leading to a feeling of early hunger, potentially contributing to overeating and, ultimately, weight regain. By separating liquids from solids by at least 30 minutes before and after meals, patients can maximize nutrient absorption, achieve sustained fullness, and support their long-term weight management goals.  Pre-Op Goals Reviewed with the Patient  Pre-Op Goals Progress & New Goals Re-engage: start taking vitamin D ; take 5,000 international units per day. Continue: track fluids; aim for 64 oz  of hydrating fluids Continue: find a protein shake you like Continue: cut out grazing between meals  and at night. Schedule meals and snacks. New: practice not not drinking with meals; take small bites and chew well; slow down at meal time, take 20-30 minutes eat.  Handouts Provided Include  Bariatric MyPlate  Learning Style & Readiness for Change Teaching method utilized: Visual & Auditory  Demonstrated degree of understanding via: Teach Back  Readiness Level: preparation Barriers to learning/adherence to lifestyle change: nothing identified  RD's Notes for next Visit  Patient progress toward chosen goals  MONITORING & EVALUATION Dietary intake, weekly physical activity, body weight, and pre-op goals.  Next Steps  Patient is to return to [facility] in 2 for next SWL visit.

## 2024-01-13 ENCOUNTER — Other Ambulatory Visit (INDEPENDENT_AMBULATORY_CARE_PROVIDER_SITE_OTHER)

## 2024-01-13 DIAGNOSIS — I10 Essential (primary) hypertension: Secondary | ICD-10-CM

## 2024-01-13 DIAGNOSIS — Z794 Long term (current) use of insulin: Secondary | ICD-10-CM

## 2024-01-13 DIAGNOSIS — E1165 Type 2 diabetes mellitus with hyperglycemia: Secondary | ICD-10-CM

## 2024-01-13 MED ORDER — NOVOLOG FLEXPEN 100 UNIT/ML ~~LOC~~ SOPN: 35.0000 [IU] | PEN_INJECTOR | Freq: Three times a day (TID) | SUBCUTANEOUS | Status: DC

## 2024-01-13 NOTE — Patient Instructions (Signed)
 It was a pleasure speaking with you today!  Increase Novolog  to 35 units before each meal Continue working on taking Xigduo  every day without missing doses Continue Tresiba  and Mounjaro   Feel free to call with any questions or concerns!  [name], PharmD, BCPS, CPP Clinical Pharmacist Practitioner [facility] [phone]

## 2024-01-13 NOTE — Progress Notes (Signed)
 01/13/2024 Name: Stephen Hall MRN: [id number] DOB: 1981/06/20  Chief Complaint  Patient presents with   Diabetes   Hypertension   Medication Management    Stephen Hall is a 43 y.o. year old male who presented for a telephone visit.   They were referred to the pharmacist by their PCP for assistance in managing diabetes and hypertension.    Subjective:  Care Team: Primary Care Provider: Joshua Debby CROME, MD ; Next Scheduled Visit: 01/28/24 Endocrinologist Dr. Mercie; Next Scheduled Visit: 02/14/24  Medication Access/Adherence  Current Pharmacy:  DARRYLE LONG - [facility] [address] [place] [address] Phone: [phone] Fax: [phone]   Patient reports affordability concerns with their medications: Yes  Patient reports access/transportation concerns to their pharmacy: No  Patient reports adherence concerns with their medications:  No    Diabetes: *Followed by endo - Dr. 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 Has tried to cut back on carbs and eat smaller portion but notes the overall food choices are not ideal.   Eats 3-4 meals/day Breakfast - leftovers; meat, juice or soda Lunch - something similar to breakfast; adds tomato, lettuce, or something green; sweet tea Afternoon snack  - whatever he can find Dinner - heartiest meal  Current physical activity: none outside of regular daily movement  Current medication access support: copay cards for Xigduo , Nexlizet , and Mounjaro   Hypertension: Current medications: candesartan  16 mg daily, amlodipine  5 mg daily, indapamide  1.25 mg daily  Pt reports lower extremity edema, noted pitting edema the last few days. No SOB. Reports increased weight about 10 lbs. Returned to work recently so has been more active and on his feet more/longer hours.   Patient does have a validated, automated, upper arm home BP cuff. Current blood pressure readings: 135-137/80-85  Of note, patient is going through the process to get approval for weight loss surgery   Objective:  Lab Results  Component Value Date   HGBA1C 8.9 (A) 10/25/2023    Lab Results  Component Value Date   CREATININE 0.90 09/23/2023   BUN 9 09/23/2023   NA 136 09/23/2023   K 3.8 09/23/2023   CL 100 09/23/2023   CO2 27 09/23/2023    Lab Results  Component Value Date   CHOL 149 09/23/2023   HDL 36.90 (L) 09/23/2023   LDLCALC 98 09/23/2023   LDLDIRECT 158.1 02/07/2012   TRIG 69.0 09/23/2023   CHOLHDL 4 09/23/2023    Medications Reviewed Today     Reviewed by Merceda Lela SAUNDERS, RPH (Pharmacist) on 01/13/24 at [phone]  Med List Status: <None>   Medication Order Taking? Sig Documenting Provider Last Dose Status Informant  albuterol  (VENTOLIN  HFA) 108 (90 Base) MCG/ACT inhaler [id number]  Inhale 2 puffs into the lungs every 6 (six) hours as  needed for wheezing or shortness of breath. Kennyth Domino, FNP  Active   amLODipine  (NORVASC ) 5 MG tablet [id number] Yes Take 1 tablet (5 mg total) by mouth daily. Joshua Debby CROME, MD  Active   Bempedoic Acid -Ezetimibe  (NEXLIZET ) 180-10 MG TABS [id number]  Take 1 tablet by mouth daily. Joshua Debby CROME, MD  Active   candesartan  (ATACAND ) 16 MG tablet [id number] Yes Take 1 tablet (16 mg total) by mouth daily. Joshua Debby CROME, MD   Active            Med Note VIOLETTA LELA JONELLE Pablo Dec 16, 2023  3:39 PM)    Continuous Glucose Sensor (DEXCOM G7 SENSOR) [place] [id number] Yes Change sensor every 10 days Thapa, [place], MD  Active   Dapagliflozin  Pro-metFORMIN  ER (XIGDUO  XR) 04-999 MG TB24 [id number] Yes Take 1 tablet by mouth daily. Thapa, [place], MD  Active   Glucagon  (GVOKE HYPOPEN  2-PACK) 1 MG/0.2ML EMMANUEL [id number]  Inject 1 pen into the skin daily as needed for low blood sugar Joshua Debby CROME, MD  Active   indapamide  (LOZOL ) 1.25 MG tablet [id number] Yes Take 1 tablet (1.25 mg total) by mouth daily. Joshua Debby CROME, MD  Active   insulin  aspart (NOVOLOG  FLEXPEN) 100 UNIT/ML FlexPen [id number] Yes Inject 30 Units into the skin 3 (three) times daily with meals. Joshua Debby CROME, MD  Active   insulin  degludec (TRESIBA  FLEXTOUCH) 200 UNIT/ML FlexTouch Pen [id number] Yes Inject 160 Units into the skin daily. Thapa, [place], MD  Active   Insulin  Pen Needle (BD PEN NEEDLE MICRO U/F) 32G X 6 MM MISC [id number]  Use as directed morning, noon, in the evening and at bedtime. Thapa, [place], MD  Active   Lancets [facility] DELICA PLUS [place]) MISC [id number]  USE TO CHECK BLOOD GLUCOSE THREE TIMES DAILY Kassie Mallick, MD  Active   rosuvastatin  (CRESTOR ) 10 MG tablet [id number]  Take 1 tablet (10 mg total) by mouth daily. Joshua Debby CROME, MD  Active   sildenafil  (VIAGRA ) 100 MG tablet [id number]  Take 1 tablet (100 mg total) by mouth daily as needed for erectile dysfunction   Active   tadalafil  (CIALIS ) 5 MG tablet [id number]  Take 1 tablet (5 mg total) by mouth daily.   Active   thiamine  (VITAMIN B-1) 50 MG tablet [id number]  Take 1 tablet (50 mg total) by mouth daily. Joshua Debby CROME, MD  Active   tirzepatide  (MOUNJARO ) 10 MG/0.5ML Pen [id number] Yes Inject 10 mg into the skin once a week. Thapa, [place], MD  Active            Med Note ELI, JESSICA H   Thu Nov 14, 2023  9:41 AM) Thursdays            Assessment/Plan:   Diabetes: - Currently  uncontrolled, A1c goal <7% - Reviewed goal A1c, goal fasting, and goal 2 hour post prandial glucose - Reviewed dietary modifications including eliminating high sugar beverages such as sodas and juices, encouraged increased protein/fiber and balanced meals/snacks - Reviewed lifestyle modifications including: increased movement/walking, strength training as able - Reviewed proper use of insulin . Novolog  to be taken 10-15 minutes prior to eating, which may help prevent high BG spikes after eating and low BGs later on. - Recommend to increase Novolog  to 35 units TID before meals. Continue Tresiba , Mounjaro , Xigduo  - Recommend to continue focus on timing of Novolog  doses and adherence with Novolog  and Xigduo .  Med access: Xigduo  copay card at [facility] [id number] PCN: CN, GRP:  [id number], ID: [id number]  Nexlizet  - already re-enrolled pt in copay card program, using same copay card as instructed  Appears Mounjaro  already has a copay card and his insulins are $0  Hypertension: - Currently uncontrolled, BP goal <130/80 - average has improved to the 130/80s - Reviewed long term cardiovascular and renal outcomes of uncontrolled blood pressure - Recommended to continue amlodipine , indapamide  and candesartan   - May need to increase indapamide  and/or candesartan  to 32 mg if BP remains elevated - Possible edema from amlodipine ?  -Awaiting PCP f/u 7/29 for office BP check   Follow Up Plan: 7/21   [name], PharmD, BCPS, CPP Clinical Pharmacist Practitioner [place] [facility] [phone]

## 2024-01-17 ENCOUNTER — Encounter: Payer: Self-pay | Admitting: Dietician

## 2024-01-17 ENCOUNTER — Encounter: Attending: General Surgery | Admitting: Dietician

## 2024-01-17 VITALS — Ht 77.0 in | Wt 336.0 lb

## 2024-01-17 DIAGNOSIS — E669 Obesity, unspecified: Secondary | ICD-10-CM | POA: Diagnosis present

## 2024-01-17 NOTE — Progress Notes (Signed)
 Supervised Weight Loss Visit Bariatric Nutrition Education Appt Start Time: [phone]    End Time: 0900  Planned surgery: Sleeve Gastrectomy Pt expectation of surgery: feel better weighing 100 lbs less  Referral stated Supervised Weight Loss (SWL) visits needed: 12  3 out of 12 SWL Appointments   NUTRITION ASSESSMENT Anthropometrics  Start weight at [facility]: 347.0 lbs (date: 11/22/2023)  Height: 77 in Weight: 336.0 lbs BMI: 39.84 kg/m2     Clinical   Pharmacotherapy: History of weight loss medication used: Mounjaro  (tolerates well), saxenda  (not tolerated)  Medical hx: T2DM, HTN, High Cholesterol, obesity, sleep apnea Medications: monjauro, metformin , novolog , tresiba , candesartan , rosuvastatin , vit B Labs: Vit D 14.4; iron 35; iron saturation <8; UIBC 390 Notable signs/symptoms: none noted Any previous deficiencies? 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 Pt states it was difficult to eat well, stating he ate anything.  Estimated daily fluid intake: 64 oz Supplements: Vit B Current average weekly physical activity: play basketball with son.  24-Hr Dietary Recall First Meal: biscuit (sausage), Dr. Nunzio zero Snack:  Second Meal: water, green beans fried chicken Snack:  Third Meal: chick fil a sandwich Snack: popcorn, soda Beverages: soda, juice, water  Alcoholic beverages per week: 0  Estimated Energy Needs Calories: 1900  NUTRITION DIAGNOSIS  Overweight/obesity ([place]-3.3) related to past poor dietary habits and physical inactivity as evidenced by patient w/ planned sleeve surgery following dietary guidelines for continued weight loss.  NUTRITION INTERVENTION  Nutrition counseling (C-1) and education (E-2) to facilitate bariatric surgery goals.  Why you need complex carbohydrates: Whole grains and other complex carbohydrates are required to have a healthy diet. Whole grains provide  fiber which can help with blood glucose levels and help keep you satiated. Fruits and starchy vegetables provide essential vitamins and minerals required for immune function, eyesight support, brain support, bone density, wound healing and many other functions within the body. According to the current evidenced based 2020-2025 Dietary Guidelines for Americans, complex carbohydrates are part of a healthy eating pattern which is associated with a decreased risk for type 2 diabetes, cancers, and cardiovascular disease.   Pre-Op Goals Reviewed with the Patient  Pre-Op Goals Progress & New Goals Continue: start taking vitamin D ; take 5,000 international units per day. Continue: track fluids; aim for 64 oz of hydrating fluids Continue: find a protein shake you like Continue: cut out grazing between meals and at night. Schedule meals and snacks. Continue: practice not not drinking with meals; take small bites and chew well; slow down at meal time, take 20-30 minutes eat. New: focus on complex carbohydrates; replace more processed foods with whole grains, fruit and starchy vegetables.  Handouts Provided Include  Bariatric MyPlate (additional copy)  Learning Style & Readiness for Change Teaching method utilized: Visual & Auditory  Demonstrated degree of understanding via: Teach Back  Readiness Level: preparation Barriers to learning/adherence to lifestyle change: nothing identified  RD's Notes for next Visit  Patient progress toward chosen goals  MONITORING & EVALUATION Dietary intake, weekly physical activity, body weight, and pre-op goals.  Next Steps  Patient is to return to [facility] in 2 for next SWL visit.

## 2024-01-20 ENCOUNTER — Other Ambulatory Visit (INDEPENDENT_AMBULATORY_CARE_PROVIDER_SITE_OTHER): Admitting: Pharmacist

## 2024-01-20 DIAGNOSIS — E1165 Type 2 diabetes mellitus with hyperglycemia: Secondary | ICD-10-CM

## 2024-01-20 DIAGNOSIS — I10 Essential (primary) hypertension: Secondary | ICD-10-CM

## 2024-01-20 DIAGNOSIS — Z794 Long term (current) use of insulin: Secondary | ICD-10-CM

## 2024-01-20 NOTE — Progress Notes (Signed)
 01/20/2024 Name: Stephen Hall MRN: [id number] DOB: 1981/05/20  Chief Complaint  Patient presents with   Diabetes   Medication Management   Hypertension    Stephen Hall is a 43 y.o. year old male who presented for a telephone visit.   They were referred to the pharmacist by their PCP for assistance in managing diabetes and hypertension.    Subjective:  Care Team: Primary Care Provider: Joshua Debby CROME, MD ; Next Scheduled Visit: 01/28/24 Endocrinologist Dr. Mercie; Next Scheduled Visit: 02/14/24  Medication Access/Adherence  Current Pharmacy:  DARRYLE LONG - [facility] [address] [place] [address] Phone: [phone] Fax: [phone]   Patient reports affordability concerns with their medications: Yes  Patient reports access/transportation concerns to their pharmacy: No  Patient reports adherence concerns with their medications:  No    Diabetes: *Followed by endo - Dr. 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 Has tried to cut back on carbs and eat smaller portion but notes the overall food choices are not ideal.   Eats 3-4 meals/day Breakfast - leftovers; meat, juice or soda Lunch - something similar to breakfast; adds tomato, lettuce, or something green; sweet tea Afternoon snack  - whatever he can find Dinner - heartiest meal  Current physical activity: none outside of regular daily movement  Current medication access support: copay cards for Xigduo , Nexlizet , and Mounjaro   Hypertension: Current medications: candesartan  16 mg daily, amlodipine  5 mg daily, indapamide  1.25 mg daily  Pt reports lower extremity edema, noted pitting edema the last few days. No SOB. Reports increased weight about 10 lbs. Returned to work recently so has been more active and on his feet more/longer hours.   Patient does have a validated, automated, upper arm home BP cuff. Current blood pressure readings: 135-140/80-85  Of note, patient is going through the process to get approval for weight loss surgery   Objective:  Lab Results  Component Value Date   HGBA1C 8.9 (A) 10/25/2023    Lab Results  Component Value Date   CREATININE 0.90 09/23/2023   BUN 9 09/23/2023   NA 136 09/23/2023   K 3.8 09/23/2023   CL 100 09/23/2023   CO2 27 09/23/2023    Lab Results  Component Value Date   CHOL 149 09/23/2023   HDL 36.90 (L) 09/23/2023   LDLCALC 98 09/23/2023   LDLDIRECT 158.1 02/07/2012   TRIG 69.0 09/23/2023   CHOLHDL 4 09/23/2023    Medications Reviewed Today   Medications were not reviewed in this encounter     Assessment/Plan:   Diabetes: - Currently uncontrolled, A1c goal <7% - average has improved from 227 to 204 since last week - Reviewed goal A1c, goal fasting, and goal 2 hour post prandial glucose - Reviewed dietary modifications including eliminating high  sugar beverages such as sodas and juices, encouraged increased protein/fiber and balanced meals/snacks - Reviewed lifestyle modifications including: increased movement/walking, strength training as able - Reviewed proper use of insulin . Novolog  to be taken 10-15 minutes prior to eating, which may help prevent high BG spikes after eating and low BGs later on. - Recommend tocontinue Novolog  to 35 units TID  before meals. Continue Tresiba , Mounjaro , Xigduo  - Recommend to continue focus on timing of Novolog  doses and adherence with Novolog  and Xigduo .  Med access: Xigduo  copay card at [facility] [id number] PCN: CN, GRP: [id number], ID: [id number]  Nexlizet  - already re-enrolled pt in copay card program, using same copay card as instructed  Appears Mounjaro  already has a copay card and his insulins are $0  Hypertension: - Currently uncontrolled, BP goal <130/80 - average has improved to the 130/80s - Reviewed long term cardiovascular and renal outcomes of uncontrolled blood pressure - Recommended to continue amlodipine , indapamide  and candesartan   - May need to increase indapamide  and/or candesartan  to 32 mg if BP remains elevated - Possible edema from amlodipine ?  -Awaiting PCP f/u 7/29 for office BP check   Follow Up Plan: 7/29   [name], PharmD, BCPS, CPP Clinical Pharmacist Practitioner [place] [facility] [phone]

## 2024-01-28 ENCOUNTER — Ambulatory Visit: Admitting: Internal Medicine

## 2024-01-28 ENCOUNTER — Ambulatory Visit: Payer: Self-pay | Admitting: Internal Medicine

## 2024-01-28 ENCOUNTER — Ambulatory Visit (INDEPENDENT_AMBULATORY_CARE_PROVIDER_SITE_OTHER): Admitting: Pharmacist

## 2024-01-28 ENCOUNTER — Encounter: Payer: Self-pay | Admitting: Internal Medicine

## 2024-01-28 VITALS — BP 130/72 | HR 77 | Temp 98.4°F | Resp 16 | Ht 77.0 in | Wt 327.0 lb

## 2024-01-28 DIAGNOSIS — E119 Type 2 diabetes mellitus without complications: Secondary | ICD-10-CM

## 2024-01-28 DIAGNOSIS — Z794 Long term (current) use of insulin: Secondary | ICD-10-CM

## 2024-01-28 DIAGNOSIS — Z23 Encounter for immunization: Secondary | ICD-10-CM

## 2024-01-28 DIAGNOSIS — E5111 Dry beriberi: Secondary | ICD-10-CM | POA: Diagnosis not present

## 2024-01-28 DIAGNOSIS — R9431 Abnormal electrocardiogram [ECG] [EKG]: Secondary | ICD-10-CM

## 2024-01-28 DIAGNOSIS — R0609 Other forms of dyspnea: Secondary | ICD-10-CM

## 2024-01-28 LAB — BASIC METABOLIC PANEL WITH GFR
BUN: 13 mg/dL (ref 6–23)
CO2: 28 meq/L (ref 19–32)
Calcium: 9.6 mg/dL (ref 8.4–10.5)
Chloride: 101 meq/L (ref 96–112)
Creatinine, Ser: 1.1 mg/dL (ref 0.40–1.50)
GFR: 83.22 mL/min (ref >60.00)
Glucose, Bld: 75 mg/dL (ref 70–99)
Potassium: 3.5 meq/L (ref 3.5–5.1)
Sodium: 140 meq/L (ref 135–145)

## 2024-01-28 LAB — CBC WITH DIFFERENTIAL/PLATELET
Basophils Absolute: 0.1 K/uL (ref 0.0–0.1)
Basophils Relative: 0.9 % (ref 0.0–3.0)
Eosinophils Absolute: 0.2 K/uL (ref 0.0–0.7)
Eosinophils Relative: 2.4 % (ref 0.0–5.0)
HCT: 40.3 % (ref 39.0–52.0)
Hemoglobin: 12.9 g/dL — ABNORMAL LOW (ref 13.0–17.0)
Lymphocytes Relative: 32.7 % (ref 12.0–46.0)
Lymphs Abs: 2.8 K/uL (ref 0.7–4.0)
MCHC: 31.9 g/dL (ref 30.0–36.0)
MCV: 73.7 fl — ABNORMAL LOW (ref 78.0–100.0)
Monocytes Absolute: 0.8 K/uL (ref 0.1–1.0)
Monocytes Relative: 9.5 % (ref 3.0–12.0)
Neutro Abs: 4.6 K/uL (ref 1.4–7.7)
Neutrophils Relative %: 54.5 % (ref 43.0–77.0)
Platelets: 334 K/uL (ref 150.0–400.0)
RBC: 5.47 Mil/uL (ref 4.22–5.81)
RDW: 16.1 % — ABNORMAL HIGH (ref 11.5–15.5)
WBC: 8.4 K/uL (ref 4.0–10.5)

## 2024-01-28 LAB — BRAIN NATRIURETIC PEPTIDE: Pro B Natriuretic peptide (BNP): 1 pg/mL (ref 0.0–100.0)

## 2024-01-28 LAB — TROPONIN I (HIGH SENSITIVITY): High Sens Troponin I: 3 ng/L (ref 2–17)

## 2024-01-28 LAB — D-DIMER, QUANTITATIVE: D-Dimer, Quant: <0.19 ug{FEU}/mL (ref <0.50)

## 2024-01-28 LAB — HEMOGLOBIN A1C: Hemoglobin A1C: 7.8

## 2024-01-28 NOTE — Progress Notes (Signed)
 01/28/2024 Name: Stephen Hall MRN: [id number] DOB: 08-22-1980  Chief Complaint  Patient presents with   Diabetes   Medication Management    Stephen Hall is a 43 y.o. year old male who presented for a telephone visit.   They were referred to the pharmacist by their PCP for assistance in managing diabetes and hypertension.    Subjective:  Care Team: Primary Care Provider: Joshua Debby CROME, MD ; Next Scheduled Visit: 01/28/24 Endocrinologist Dr. Mercie; Next Scheduled Visit: 02/14/24  Medication Access/Adherence  Current Pharmacy:  D[facility] LONG - [facility] [address] [place] [address] Phone: [phone] Fax: [phone]   Patient reports affordability concerns with their medications: Yes  Patient reports access/transportation concerns to their pharmacy: No  Patient reports adherence concerns with their medications:  No    Diabetes: *Followed by endo - Dr. Mercie Current medications: Xigduo  XR 04/999 mg daily, Mounjaro  10 mg weekly (Thursdays), Tresiba  160 units per day in AM, Novolog  35 units TID AC - Only taking Xigduo  about 3-4x per - has had trouble taking daily since he notes the medication tastes bad. Of note, pt has taken Xigduo  almost daily this over the last 10 days. 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 He often waits a few days in between sensors.  Medications tried in the past: Mounjaro  12.5 mg caused GI upset  Current glucose readings: Using Dexcom G7    Current meal patterns:  Pt describes his diet as terrible because he is always eating on the go. Has tried to cut back on carbs and eat smaller portion but notes the overall food choices are not  ideal.   Eats 3-4 meals/day Breakfast - leftovers; meat, juice or soda Lunch - something similar to breakfast; adds tomato, lettuce, or something green; sweet tea Afternoon snack - whatever he can find Dinner - heartiest meal  Current physical activity: none outside of regular daily movement  Current medication access support: copay cards for Xigduo , Nexlizet , and Mounjaro   Hypertension: Current medications: candesartan  16 mg daily, amlodipine  5 mg daily, indapamide  1.25 mg daily  Pt reports lower extremity edema, noted pitting edema the last few days. No SOB. Reports increased weight about 10 lbs. Returned to work recently so has been more active and on his feet more/longer hours.   Patient does have a validated, automated, upper arm home BP cuff. Current blood pressure readings: 135-140/80-85  Of note, patient is going through the process to get approval for weight loss surgery   Objective:  Lab Results  Component Value Date   HGBA1C 7.8 01/28/2024    Lab Results  Component Value Date   CREATININE 0.90 09/23/2023   BUN 9 09/23/2023   NA 136 09/23/2023   K 3.8 09/23/2023   CL 100 09/23/2023   CO2 27 09/23/2023    Lab Results  Component Value Date   CHOL 149 09/23/2023   HDL 36.90 (L) 09/23/2023   LDLCALC 98 09/23/2023   LDLDIRECT 158.1 02/07/2012   TRIG 69.0 09/23/2023   CHOLHDL 4 09/23/2023    Medications Reviewed Today     Reviewed by Merceda Lela SAUNDERS, RPH (Pharmacist) on 01/28/24 at [phone]  Med List  Status: <None>   Medication Order Taking? Sig Documenting Provider Last Dose Status Informant  albuterol  (VENTOLIN  HFA) 108 (90 Base) MCG/ACT inhaler [id number]  Inhale 2 puffs into the lungs every 6 (six) hours as needed for wheezing or shortness of breath. Kennyth Domino, FNP  Active   amLODipine  (NORVASC ) 5 MG tablet [id number]  Take 1 tablet (5 mg total) by mouth daily. Joshua Debby CROME, MD  Active   Bempedoic Acid -Ezetimibe  (NEXLIZET ) 180-10 MG TABS [id number]   Take 1 tablet by mouth daily. Joshua Debby CROME, MD  Active   candesartan  (ATACAND ) 16 MG tablet [id number]  Take 1 tablet (16 mg total) by mouth daily. Joshua Debby CROME, MD  Active            Med Note VIOLETTA LELA JONELLE Pablo Dec 16, 2023  3:39 PM)    Continuous Glucose Sensor (DEXCOM G7 SENSOR) [place] [id number]  Change sensor every 10 days Thapa, [place], MD  Active   Dapagliflozin  Pro-metFORMIN  ER (XIGDUO  XR) 04-999 MG TB24 [id number] Yes Take 1 tablet by mouth daily. Thapa, [place], MD  Active   Glucagon  (GVOKE HYPOPEN  2-PACK) 1 MG/0.2ML SOAJ [id number]  Inject 1 pen into the skin daily as needed for low blood sugar Joshua Debby CROME, MD  Active   indapamide  (LOZOL ) 1.25 MG tablet [id number]  Take 1 tablet (1.25 mg total) by mouth daily. Joshua Debby CROME, MD  Active   insulin  aspart (NOVOLOG  FLEXPEN) 100 UNIT/ML FlexPen [id number] Yes Inject 35 Units into the skin 3 (three) times daily with meals. Joshua Debby CROME, MD  Active   insulin  degludec (TRESIBA  FLEXTOUCH) 200 UNIT/ML FlexTouch Pen [id number] Yes Inject 160 Units into the skin daily. Thapa, [place], MD  Active   Insulin  Pen Needle (BD PEN NEEDLE MICRO U/F) 32G X 6 MM MISC [id number]  Use as directed morning, noon, in the evening and at bedtime. Thapa, [place], MD  Active   Lancets [facility] DELICA PLUS [place]) MISC [id number]  USE TO CHECK BLOOD GLUCOSE THREE TIMES DAILY Kassie Mallick, MD  Active   rosuvastatin  (CRESTOR ) 10 MG tablet [id number]  Take 1 tablet (10 mg total) by mouth daily. Joshua Debby CROME, MD  Active   sildenafil  (VIAGRA ) 100 MG tablet [id number]  Take 1 tablet (100 mg total) by mouth daily as needed for erectile dysfunction   Active   tadalafil  (CIALIS ) 5 MG tablet [id number]  Take 1 tablet (5 mg total) by mouth daily.   Active   thiamine  (VITAMIN B-1) 50 MG tablet [id number]  Take 1 tablet (50 mg total) by mouth daily. Joshua Debby CROME, MD  Active   tirzepatide  (MOUNJARO ) 10 MG/0.5ML Pen [id number] Yes Inject 10 mg into the skin once a week.  Thapa, [place], MD  Active            Med Note ELI, JESSICA H   Thu Nov 14, 2023  9:41 AM) Thursdays            Assessment/Plan:   Diabetes: - Currently uncontrolled, A1c goal <7% - average has improved from 204 to 188 since last week - Reviewed goal A1c, goal fasting, and goal 2 hour post prandial glucose - Reviewed dietary modifications including eliminating high sugar beverages such as sodas and juices, encouraged increased protein/fiber and balanced meals/snacks - Reviewed lifestyle modifications including: increased movement/walking, strength training as able - Reviewed proper use of insulin . Novolog  to be taken 10-15 minutes prior to eating, which may help prevent high BG spikes after eating and low  BGs later on. - Recommend to continue Novolog  to 35 units TID before meals. Continue Tresiba , Mounjaro , Xigduo  - Recommend to continue focus on timing of Novolog  doses and adherence with Novolog  and Xigduo .  Med access: Xigduo  copay card at [facility] [id number] PCN: [id number], GRP: [id number], ID: [id number]  Nexlizet  - already re-enrolled pt in copay card program, using same copay card as instructed  Appears Mounjaro  already has a copay card and his insulins are $0  Hypertension: - Currently borderline controlled, BP goal <130/80 - average has improved to the 130/80s - Reviewed long term cardiovascular and renal outcomes of uncontrolled blood pressure - Recommended to continue amlodipine , indapamide  and candesartan   - May need to increase indapamide  and/or candesartan  to 32 mg if BP remains elevated   Follow Up Plan: 8/5   [name], PharmD, BCPS, CPP Clinical Pharmacist Practitioner [facility] [phone]

## 2024-01-28 NOTE — Progress Notes (Signed)
 Subjective:  Patient ID: Stephen Hall, male    DOB: [birth year]  Age: 43 y.o. MRN: [id number]  CC: Hypertension, Anemia, and Diabetes   HPI Stephen Hall presents for f/up  ----  Discussed the use of AI scribe software for clinical note transcription with the patient, who gave verbal consent to proceed.  History of Present Illness Stephen Hall is a 43 year old male with diabetes who presents with difficulty swallowing medication and shortness of breath on exertion.  He experiences difficulty swallowing his medication, specifically Xigduo , due to its large size. There is no choking or pain when swallowing, and the difficulty is not present with other medications. He is frustrated by the number of pills he takes.  He denies symptoms of high or low blood sugar today, such as excessive thirst or urination, but notes changes in his weight. He is unsure of the cause of his weight loss. He reports inconsistent adherence to taking Novolog  before meals, with a recent blood sugar reading of 110 before taking his shot and 125 after, approximately an hour and a half later. He has not experienced low blood sugars requiring treatment with a pen, but manages them with sugary foods or peanut butter.  He mentions occasional leg swelling, though his legs are not swollen today. He also reports feeling like he is losing muscle mass, particularly noticing weakness when playing basketball, as he gets pushed around more than usual.  He experiences shortness of breath during physical activities, such as playing basketball and mowing the lawn, which is a new and severe symptom for him. He has to stop twice while mowing to catch his breath, indicating significant fatigue.  He sometimes has trouble swallowing during meals, requiring drinks to aid swallowing, which contrasts with his dietitian's advice in preparation for bariatric surgery to eat meals without water or drinks.    Outpatient Medications Prior to  Visit  Medication Sig Dispense Refill   albuterol  (VENTOLIN  HFA) 108 (90 Base) MCG/ACT inhaler Inhale 2 puffs into the lungs every 6 (six) hours as needed for wheezing or shortness of breath. 7.5 g 0   amLODipine  (NORVASC ) 5 MG tablet Take 1 tablet (5 mg total) by mouth daily. 90 tablet 1   Bempedoic Acid -Ezetimibe  (NEXLIZET ) 180-10 MG TABS Take 1 tablet by mouth daily. 90 tablet 1   candesartan  (ATACAND ) 16 MG tablet Take 1 tablet (16 mg total) by mouth daily. 90 tablet 1   Continuous Glucose Sensor (DEXCOM G7 SENSOR) MISC Change sensor every 10 days 3 each 3   Dapagliflozin  Pro-metFORMIN  ER (XIGDUO  XR) 04-999 MG TB24 Take 1 tablet by mouth daily. 90 tablet 3   Glucagon  (GVOKE HYPOPEN  2-PACK) 1 MG/0.2ML SOAJ Inject 1 pen into the skin daily as needed for low blood sugar 0.4 mL 5   indapamide  (LOZOL ) 1.25 MG tablet Take 1 tablet (1.25 mg total) by mouth daily. 90 tablet 1   insulin  aspart (NOVOLOG  FLEXPEN) 100 UNIT/ML FlexPen Inject 35 Units into the skin 3 (three) times daily with meals.     insulin  degludec (TRESIBA  FLEXTOUCH) 200 UNIT/ML FlexTouch Pen Inject 160 Units into the skin daily. 81 mL 3   Insulin  Pen Needle (BD PEN NEEDLE MICRO U/F) 32G X 6 MM MISC Use as directed morning, noon, in the evening and at bedtime. 300 each 3   Lancets (ONETOUCH DELICA PLUS LANCET33G) MISC USE TO CHECK BLOOD GLUCOSE THREE TIMES DAILY 100 each 1   rosuvastatin  (CRESTOR ) 10 MG tablet Take 1 tablet (10 mg  total) by mouth daily. 90 tablet 1   sildenafil  (VIAGRA ) 100 MG tablet Take 1 tablet (100 mg total) by mouth daily as needed for erectile dysfunction 30 tablet 3   tadalafil  (CIALIS ) 5 MG tablet Take 1 tablet (5 mg total) by mouth daily. 90 tablet 3   thiamine  (VITAMIN B-1) 50 MG tablet Take 1 tablet (50 mg total) by mouth daily. 100 tablet 1   tirzepatide  (MOUNJARO ) 10 MG/0.5ML Pen Inject 10 mg into the skin once a week. 9 mL 3   No facility-administered medications prior to visit.    ROS Review of  Systems  Constitutional:  Positive for fatigue. Negative for appetite change, chills, diaphoresis, fever and unexpected weight change.  HENT:  Positive for trouble swallowing. Negative for sinus pressure.   Eyes: Negative.   Respiratory:  Positive for shortness of breath (DOE). Negative for cough, choking, chest tightness and wheezing.   Cardiovascular:  Negative for chest pain, palpitations and leg swelling.  Gastrointestinal: Negative.  Negative for abdominal pain, constipation, diarrhea, nausea and vomiting.  Endocrine: Negative.   Genitourinary: Negative.  Negative for difficulty urinating.  Musculoskeletal:  Negative for arthralgias and myalgias.  Skin: Negative.   Neurological: Negative.  Negative for dizziness and weakness.  Hematological:  Negative for adenopathy. Does not bruise/bleed easily.  Psychiatric/Behavioral: Negative.      Objective:  BP 130/72 (BP Location: Left Arm, Patient Position: Sitting, Cuff Size: Large)   Pulse 77   Temp 98.4 F (36.9 C) (Oral)   Resp 16   Ht 6' 5 (1.956 m)   Wt (!) 327 lb (148.3 kg)   SpO2 98%   BMI 38.78 kg/m   BP Readings from Last 3 Encounters:  01/28/24 130/72  10/25/23 136/80  09/23/23 (!) 146/84    Wt Readings from Last 3 Encounters:  01/28/24 (!) 327 lb (148.3 kg)  01/17/24 (!) 336 lb (152.4 kg)  12/31/23 (!) 332 lb 9.6 oz (150.9 kg)    Physical Exam Vitals reviewed.  Constitutional:      Appearance: Normal appearance.  HENT:     Nose: Nose normal.     Mouth/Throat:     Mouth: Mucous membranes are moist.  Eyes:     General: No scleral icterus.    Conjunctiva/sclera: Conjunctivae normal.  Cardiovascular:     Rate and Rhythm: Normal rate and regular rhythm.     Heart sounds: Normal heart sounds, S1 normal and S2 normal. No murmur heard.    No friction rub. No gallop.     Comments: EKG - NSR, 80 bpm NS T wave changes (new) No LVH or Q waves Pulmonary:     Effort: Pulmonary effort is normal.     Breath  sounds: No stridor. No wheezing, rhonchi or rales.  Abdominal:     General: Abdomen is flat.     Palpations: There is no mass.     Tenderness: There is no abdominal tenderness. There is no guarding.     Hernia: No hernia is present.  Musculoskeletal:     Cervical back: Neck supple.     Right lower leg: No edema.     Left lower leg: No edema.  Skin:    General: Skin is warm and dry.     Coloration: Skin is not jaundiced.  Neurological:     General: No focal deficit present.     Mental Status: He is alert. Mental status is at baseline.  Psychiatric:        Mood and  Affect: Mood normal.        Behavior: Behavior normal.     Lab Results  Component Value Date   WBC 7.6 09/23/2023   HGB 12.7 (L) 09/23/2023   HCT 39.3 09/23/2023   PLT 319.0 09/23/2023   GLUCOSE 161 (H) 09/23/2023   CHOL 149 09/23/2023   TRIG 69.0 09/23/2023   HDL 36.90 (L) 09/23/2023   LDLDIRECT 158.1 02/07/2012   LDLCALC 98 09/23/2023   ALT 18 09/23/2023   AST 15 09/23/2023   NA 136 09/23/2023   K 3.8 09/23/2023   CL 100 09/23/2023   CREATININE 0.90 09/23/2023   BUN 9 09/23/2023   CO2 27 09/23/2023   TSH 2.27 09/23/2023   PSA 0.71 02/13/2023   HGBA1C 7.8 01/28/2024   MICROALBUR <0.7 09/23/2023    DG Chest 2 View Result Date: 12/03/2023 CLINICAL DATA:  New bariatric patient.  Preop for bariatric surgery. EXAM: CHEST - 2 VIEW COMPARISON:  06/20/2016 FINDINGS: Lung volumes are low. The cardiomediastinal contours are normal. The lungs are clear. Pulmonary vasculature is normal. No consolidation, pleural effusion, or pneumothorax. No acute osseous abnormalities are seen. IMPRESSION: Low lung volumes without acute chest finding. Electronically Signed   By: Andrea Gasman M.D.   On: 12/03/2023 20:42   DG UGI W SINGLE CM (SOL OR THIN BA) Result Date: 12/03/2023 CLINICAL DATA:  43 year old male with plans for upcoming bariatric surgery. UGI series request for preoperative evaluation. EXAM: DG UGI W SINGLE CM  TECHNIQUE: Scout radiograph was obtained. Single contrast examination was performed using thin liquid barium. This exam was performed by Solmon Ku PA-C, and was supervised and interpreted by Rockey Kilts, MD. FLUOROSCOPY: Radiation Exposure Index (as provided by the fluoroscopic device): 6.6 mGy Kerma COMPARISON:  None Available. FINDINGS: Scout Radiograph: Normal bowel gas pattern. Esophagus: Mild mucosal ring at the gastroesophageal junction including on 43/11. Esophageal motility:  Within normal limits. Gastroesophageal reflux:  None visualized. Ingested 13mm barium tablet:  Passed normally Stomach: Tiny hiatal hernia. Gastric emptying: Normal. Duodenum: Normal in appearance and course. Other:  None. IMPRESSION: Tiny hiatal hernia. Mucosal ring at the gastroesophageal junction. Given prompt passage of a 13 mm barium tablet, felt unlikely to be clinically significant. Electronically Signed   By: Rockey Kilts M.D.   On: 12/03/2023 15:11    Assessment & Plan:   DOE (dyspnea on exertion)- Will evaluate for CAD. -     EKG 12-Lead -     Brain natriuretic peptide; Future -     Troponin I (High Sensitivity); Future -     D-dimer, quantitative; Future -     CT CORONARY MORPH W/CTA COR W/SCORE W/CA W/CM &/OR WO/CM; Future  Immunization due -     Heplisav-B  (HepB-CPG) Vaccine  Insulin -requiring or dependent type II diabetes mellitus (HCC)- A1C has improved. -     Basic metabolic panel with GFR; Future -     HM Diabetes Foot Exam  Thiamine  deficiency neuropathy -     CBC with Differential/Platelet; Future  Abnormal electrocardiogram (ECG) (EKG) -     CT CORONARY MORPH W/CTA COR W/SCORE W/CA W/CM &/OR WO/CM; Future     Follow-up: Return in about 3 months (around 04/29/2024).  Debby Molt, MD

## 2024-01-28 NOTE — Patient Instructions (Signed)
 It was a pleasure speaking with you today!  Continue your current regimen. Focus on taking medications daily as prescribed and continue working on diet and exercise.  Feel free to call with any questions or concerns!  [name], PharmD, BCPS, CPP Clinical Pharmacist Practitioner [facility] [phone]

## 2024-01-28 NOTE — Patient Instructions (Addendum)
 You received your first Hepatitis B vaccine today. Please return for the second dose in one month. Type 2 Diabetes Mellitus, Diagnosis, Adult Type 2 diabetes (type 2 diabetes mellitus) is a long-term, or chronic, disease. In type 2 diabetes, one or both of these problems may be present: The pancreas does not make enough of a hormone called insulin . Cells in the body do not respond properly to the insulin  that the body makes (insulin  resistance). Normally, insulin  allows blood sugar (glucose) to enter cells in the body. The cells use glucose for energy. Insulin  resistance or lack of insulin  causes excess glucose to build up in the blood instead of going into cells. This causes high blood glucose (hyperglycemia).  What are the causes? The exact cause of type 2 diabetes is not known. What increases the risk? The following factors may make you more likely to develop this condition: Having a family member with type 2 diabetes. Being overweight or obese. Being inactive (sedentary). Having been diagnosed with insulin  resistance. Having a history of prediabetes, diabetes when you were pregnant (gestational diabetes), or polycystic ovary syndrome (PCOS). What are the signs or symptoms? In the early stage of this condition, you may not have symptoms. Symptoms develop slowly and may include: Increased thirst or hunger. Increased urination. Unexplained weight loss. Tiredness (fatigue) or weakness. Vision changes, such as blurry vision. Dark patches on the skin. How is this diagnosed? This condition is diagnosed based on your symptoms, your medical history, a physical exam, and your blood glucose level. Your blood glucose may be checked with one or more of the following blood tests: A fasting blood glucose (FBG) test. You will not be allowed to eat (you will fast) for 8 hours or longer before a blood sample is taken. A random blood glucose test. This test checks blood glucose at any time of day  regardless of when you ate. An A1C (hemoglobin A1C) blood test. This test provides information about blood glucose levels over the previous 2-3 months. An oral glucose tolerance test (OGTT). This test measures your blood glucose at two times: After fasting. This is your baseline blood glucose level. Two hours after drinking a beverage that contains glucose. You may be diagnosed with type 2 diabetes if: Your fasting blood glucose level is 126 mg/dL (7.0 mmol/L) or higher. Your random blood glucose level is 200 mg/dL (88.8 mmol/L) or higher. Your A1C level is 6.5% or higher. Your oral glucose tolerance test result is higher than 200 mg/dL (88.8 mmol/L). These blood tests may be repeated to confirm your diagnosis. How is this treated? Your treatment may be managed by a specialist called an endocrinologist. Type 2 diabetes may be treated by following instructions from your health care provider about: Making dietary and lifestyle changes. These may include: Following a personalized nutrition plan that is developed by a registered dietitian. Exercising regularly. Finding ways to manage stress. Checking your blood glucose level as often as told. Taking diabetes medicines or insulin  daily. This helps to keep your blood glucose levels in the healthy range. Taking medicines to help prevent complications from diabetes. Medicines may include: Aspirin. Medicine to lower cholesterol. Medicine to control blood pressure. Your health care provider will set treatment goals for you. Your goals will be based on your age, other medical conditions you have, and how you respond to diabetes treatment. Generally, the goal of treatment is to maintain the following blood glucose levels: Before meals: 80-130 mg/dL (4.4-7.2 mmol/L). After meals: below 180 mg/dL (10 mmol/L).  A1C level: less than 7%. Follow these instructions at home: Questions to ask your health care provider Consider asking the following  questions: Should I meet with a certified diabetes care and education specialist? What diabetes medicines do I need, and when should I take them? What equipment will I need to manage my diabetes at home? How often do I need to check my blood glucose? Where can I find a support group for people with diabetes? What number can I call if I have questions? When is my next appointment? General instructions Take over-the-counter and prescription medicines only as told by your health care provider. Keep all follow-up visits. This is important. Where to find more information For help and guidance and for more information about diabetes, please visit: American Diabetes Association (ADA): www.diabetes.org American Association of Diabetes Care and Education Specialists (ADCES): www.diabeteseducator.org International Diabetes Federation (IDF): DCOnly.dk Contact a health care provider if: Your blood glucose is at or above 240 mg/dL (86.6 mmol/L) for 2 days in a row. You have been sick or have had a fever for 2 days or longer, and you are not getting better. You have any of the following problems for more than 6 hours: You cannot eat or drink. You have nausea and vomiting. You have diarrhea. Get help right away if: You have severe hypoglycemia. This means your blood glucose is lower than 54 mg/dL (3.0 mmol/L). You become confused or you have trouble thinking clearly. You have difficulty breathing. You have moderate or large ketone levels in your urine. These symptoms may represent a serious problem that is an emergency. Do not wait to see if the symptoms will go away. Get medical help right away. Call your local emergency services (911 in the [place] Do not drive yourself to the hospital. Summary Type 2 diabetes mellitus is a long-term, or chronic, disease. In type 2 diabetes, the pancreas does not make enough of a hormone called insulin , or cells in the body do not respond properly to insulin  that  the body makes. This condition is treated by making dietary and lifestyle changes and taking diabetes medicines or insulin . Your health care provider will set treatment goals for you. Your goals will be based on your age, other medical conditions you have, and how you respond to diabetes treatment. Keep all follow-up visits. This is important. This information is not intended to replace advice given to you by your health care provider. Make sure you discuss any questions you have with your health care provider. Document Revised: 09/12/2020 Document Reviewed: 09/12/2020 Elsevier Patient Education  2024 [employer].

## 2024-01-31 ENCOUNTER — Encounter: Attending: General Surgery | Admitting: Dietician

## 2024-01-31 ENCOUNTER — Other Ambulatory Visit (HOSPITAL_COMMUNITY): Payer: Self-pay | Admitting: *Deleted

## 2024-01-31 ENCOUNTER — Encounter (HOSPITAL_COMMUNITY): Payer: Self-pay

## 2024-01-31 ENCOUNTER — Encounter: Payer: Self-pay | Admitting: Dietician

## 2024-01-31 ENCOUNTER — Other Ambulatory Visit (HOSPITAL_COMMUNITY): Payer: Self-pay

## 2024-01-31 VITALS — Ht 77.0 in | Wt 328.5 lb

## 2024-01-31 DIAGNOSIS — E669 Obesity, unspecified: Secondary | ICD-10-CM | POA: Diagnosis present

## 2024-01-31 DIAGNOSIS — E119 Type 2 diabetes mellitus without complications: Secondary | ICD-10-CM | POA: Insufficient documentation

## 2024-01-31 MED ORDER — METOPROLOL TARTRATE 100 MG PO TABS
ORAL_TABLET | ORAL | 0 refills | Status: DC
  Filled 2024-01-31: qty 1, 1d supply, fill #0

## 2024-01-31 NOTE — Progress Notes (Signed)
 Supervised Weight Loss Visit Bariatric Nutrition Education Appt Start Time: 0844    End Time: 0903  Planned surgery: Sleeve Gastrectomy Pt expectation of surgery: feel better weighing 100 lbs less  Referral stated Supervised Weight Loss (SWL) visits needed: 12  4 out of 12 SWL Appointments   NUTRITION ASSESSMENT Anthropometrics  Start weight at [facility]: 347.0 lbs (date: 11/22/2023)  Height: 77 in Weight: 328.5 lbs BMI: 38.95 kg/m2     Clinical   Pharmacotherapy: History of weight loss medication used: Mounjaro  (tolerates well), saxenda  (not tolerated)  Medical hx: T2DM, HTN, High Cholesterol, obesity, sleep apnea Medications: monjauro, metformin , novolog , tresiba , candesartan , rosuvastatin , vit B Labs: Vit D 14.4; iron 35; iron saturation <8; UIBC 390 Notable signs/symptoms: none noted Any previous deficiencies? No  Lifestyle & Dietary Hx  Pt states he has tried to prepare meals, stating he tries to have a protein. Pt states it seems like he is eating a lot of food when aiming for 80 grams per day, stating maybe it is the Mounjaro  and not having an appetite. Pt states he is trying to increase daily activity, parking father away, etc... Pt states he is more fatigued and saw his PCP, and will be seeing his endocrinologist.  Estimated daily fluid intake: 64 oz Supplements: Vit B Current average weekly physical activity: play basketball with son, some light weights randomly  24-Hr Dietary Recall First Meal: biscuit (sausage), Dr. Nunzio zero Snack:  Second Meal: water, green beans fried chicken Snack:  Third Meal: chick fil a sandwich Snack: popcorn, soda Beverages: soda, juice, water  Alcoholic beverages per week: 0  Estimated Energy Needs Calories: 1900  NUTRITION DIAGNOSIS  Overweight/obesity ([place]-3.3) related to past poor dietary habits and physical inactivity as evidenced by patient w/ planned sleeve surgery following dietary guidelines for continued weight  loss.  NUTRITION INTERVENTION  Nutrition counseling (C-1) and education (E-2) to facilitate bariatric surgery goals.  Getting enough protein before and after bariatric surgery is essential for healing, preserving muscle mass, and supporting long-term weight loss. Aim to include a protein-rich food with every meal and snack--such as eggs, lean meats, dairy, or plant-based options--and track your intake to reach a daily goal of 80 grams. Building this habit before surgery helps your body adjust and makes it easier to meet your nutritional needs after surgery, when portion sizes are smaller and protein becomes even more critical.  Pre-Op Goals Reviewed with the Patient  Pre-Op Goals Progress & New Goals Continue: start taking vitamin D ; take 5,000 international units per day. Continue: track fluids; aim for 64 oz of hydrating fluids Continue: find a protein shake you like Continue: cut out grazing between meals and at night. Schedule meals and snacks. Re-engage: practice not not drinking with meals; take small bites and chew well; slow down at meal time, take 20-30 minutes eat. Continue: focus on complex carbohydrates; replace more processed foods with whole grains, fruit and starchy vegetables. New: track your protein; aim for 80 grams per day; include a protein with every meal or snack  Handouts Provided Include    Learning Style & Readiness for Change Teaching method utilized: Visual & Auditory  Demonstrated degree of understanding via: Teach Back  Readiness Level: preparation Barriers to learning/adherence to lifestyle change: nothing identified  RD's Notes for next Visit  Patient progress toward chosen goals  MONITORING & EVALUATION Dietary intake, weekly physical activity, body weight, and pre-op goals.  Next Steps  Patient is to return to [facility] in 2 for next SWL  visit.

## 2024-02-01 ENCOUNTER — Other Ambulatory Visit (HOSPITAL_COMMUNITY): Payer: Self-pay

## 2024-02-04 ENCOUNTER — Ambulatory Visit (HOSPITAL_COMMUNITY)
Admission: RE | Admit: 2024-02-04 | Discharge: 2024-02-04 | Disposition: A | Discharge status: Home / Self Care | Source: Ambulatory Visit | Attending: Internal Medicine | Admitting: Internal Medicine

## 2024-02-04 ENCOUNTER — Other Ambulatory Visit (INDEPENDENT_AMBULATORY_CARE_PROVIDER_SITE_OTHER): Admitting: Pharmacist

## 2024-02-04 DIAGNOSIS — I2510 Atherosclerotic heart disease of native coronary artery without angina pectoris: Secondary | ICD-10-CM | POA: Insufficient documentation

## 2024-02-04 DIAGNOSIS — Z794 Long term (current) use of insulin: Secondary | ICD-10-CM

## 2024-02-04 DIAGNOSIS — R911 Solitary pulmonary nodule: Secondary | ICD-10-CM | POA: Diagnosis not present

## 2024-02-04 DIAGNOSIS — R9431 Abnormal electrocardiogram [ECG] [EKG]: Secondary | ICD-10-CM | POA: Diagnosis not present

## 2024-02-04 DIAGNOSIS — R0609 Other forms of dyspnea: Secondary | ICD-10-CM | POA: Diagnosis not present

## 2024-02-04 DIAGNOSIS — I10 Essential (primary) hypertension: Secondary | ICD-10-CM

## 2024-02-04 DIAGNOSIS — E119 Type 2 diabetes mellitus without complications: Secondary | ICD-10-CM

## 2024-02-04 MED ORDER — IOHEXOL 350 MG/ML SOLN
100.0000 mL | Freq: Once | INTRAVENOUS | Status: AC | PRN
  Administered 2024-02-04: 100 mL via INTRAVENOUS

## 2024-02-04 MED ORDER — NITROGLYCERIN 0.4 MG SL SUBL
0.8000 mg | SUBLINGUAL_TABLET | Freq: Once | SUBLINGUAL | Status: AC
  Administered 2024-02-04: 0.8 mg via SUBLINGUAL

## 2024-02-04 NOTE — Progress Notes (Signed)
 02/04/2024 Name: Stephen Hall MRN: [id number] DOB: 12/27/1980  Chief Complaint  Patient presents with   Diabetes   Medication Management   Hypertension    Audiel Scheiber is a 43 y.o. year old male who presented for a telephone visit.   They were referred to the pharmacist by their PCP for assistance in managing diabetes and hypertension.    Subjective:  Care Team: Primary Care Provider: Joshua Debby CROME, MD ; Next Scheduled Visit: 04/29/24 Endocrinologist Dr. Mercie; Next Scheduled Visit: 02/14/24  Medication Access/Adherence  Current Pharmacy:  DARRYLE LONG - [facility] [address] [place] [address] Phone: [phone] Fax: [phone]   Patient reports affordability concerns with their medications: Yes  Patient reports access/transportation concerns to their pharmacy: No  Patient reports adherence concerns with their medications:  No    Diabetes: *Followed by endo - Dr. Mercie Current medications: Xigduo  XR 04/999 mg daily, Mounjaro  10 mg weekly (Thursdays), Tresiba  160 units per day in AM, Novolog  35 units TID AC - Only taking Xigduo  about 3-4x per - has had trouble taking daily since he notes the medication tastes bad. Of note, pt has taken Xigduo  almost daily this over the last 10 days. 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   Medications tried in the past: Mounjaro  12.5 mg caused GI upset  Current glucose readings: Using Dexcom G7    Current meal patterns:  Pt describes his diet as terrible because he is always eating on the go. Has tried to cut back on carbs and eat smaller portion but notes the overall food choices are not  ideal.   Eats 3-4 meals/day Breakfast - leftovers; meat, juice or soda Lunch - something similar to breakfast; adds tomato, lettuce, or something green; sweet tea Afternoon snack - whatever he can find Dinner - heartiest meal  Current physical activity: none outside of regular daily movement  Current medication access support: copay cards for Xigduo , Nexlizet , and Mounjaro   Hypertension: Current medications: candesartan  16 mg daily, amlodipine  5 mg daily, indapamide  1.25 mg daily  Patient does have a validated, automated, upper arm home BP cuff. - Has not checked BP since receiving new blood pressure cuff  Of note, patient is going through the process to get approval for weight loss surgery  - Expressed concern over  Objective:  Lab Results  Component Value Date   HGBA1C 7.8 01/28/2024    Lab Results  Component Value Date   CREATININE 1.10 01/28/2024   BUN 13 01/28/2024   NA 140 01/28/2024   K 3.5 01/28/2024   CL 101 01/28/2024   CO2 28 01/28/2024    Lab Results  Component Value Date   CHOL 149 09/23/2023   HDL 36.90 (L) 09/23/2023   LDLCALC 98 09/23/2023   LDLDIRECT 158.1 02/07/2012   TRIG 69.0 09/23/2023   CHOLHDL 4 09/23/2023    Medications Reviewed Today     Reviewed by Merceda Lela SAUNDERS, RPH (Pharmacist) on 02/04/24 at 1112  Med List Status: <None>   Medication Order Taking? Sig Documenting Provider Last Dose Status Informant  albuterol  (VENTOLIN  HFA) 108 (90 Base) MCG/ACT inhaler [id number]  Inhale  2 puffs into the lungs every 6 (six) hours as needed for wheezing or shortness of breath. Kennyth Domino, FNP  Active   amLODipine  (NORVASC ) 5 MG tablet [id number]  Take 1 tablet (5 mg total) by mouth daily. Joshua Debby CROME, MD  Active   Bempedoic Acid -Ezetimibe  (NEXLIZET ) 180-10 MG TABS [id number]  Take 1 tablet by mouth daily. Joshua Debby CROME, MD  Active   candesartan  (ATACAND ) 16 MG tablet [id number]  Take 1 tablet (16 mg total) by mouth daily. Joshua Debby CROME,  MD  Active            Med Note VIOLETTA LELA JONELLE Pablo Dec 16, 2023  3:39 PM)    Continuous Glucose Sensor (DEXCOM G7 SENSOR) [place] [id number] Yes Change sensor every 10 days Thapa, [place], MD  Active   Dapagliflozin  Pro-metFORMIN  ER (XIGDUO  XR) 04-999 MG TB24 [id number] Yes Take 1 tablet by mouth daily. Thapa, [place], MD  Active   Glucagon  (GVOKE HYPOPEN  2-PACK) 1 MG/0.2ML SOAJ [id number]  Inject 1 pen into the skin daily as needed for low blood sugar Joshua Debby CROME, MD  Active   indapamide  (LOZOL ) 1.25 MG tablet [id number]  Take 1 tablet (1.25 mg total) by mouth daily. Joshua Debby CROME, MD  Active   insulin  aspart (NOVOLOG  FLEXPEN) 100 UNIT/ML FlexPen [id number] Yes Inject 35 Units into the skin 3 (three) times daily with meals. Joshua Debby CROME, MD  Active   insulin  degludec (TRESIBA  FLEXTOUCH) 200 UNIT/ML FlexTouch Pen [id number] Yes Inject 160 Units into the skin daily. Thapa, [place], MD  Active   Insulin  Pen Needle (BD PEN NEEDLE MICRO U/F) 32G X 6 MM MISC [id number]  Use as directed morning, noon, in the evening and at bedtime. Thapa, [place], MD  Active   Lancets [facility] DELICA PLUS [place]) MISC [id number]  USE TO CHECK BLOOD GLUCOSE THREE TIMES DAILY Kassie Mallick, MD  Active   metoprolol  tartrate (LOPRESSOR ) 100 MG tablet [id number]  Take tablet (100mg ) TWO hours prior to your cardiac CT scan. Barbaraann Darryle Debby, MD  Active   rosuvastatin  (CRESTOR ) 10 MG tablet [id number]  Take 1 tablet (10 mg total) by mouth daily. Joshua Debby CROME, MD  Active   sildenafil  (VIAGRA ) 100 MG tablet [id number]  Take 1 tablet (100 mg total) by mouth daily as needed for erectile dysfunction   Active   tadalafil  (CIALIS ) 5 MG tablet [id number]  Take 1 tablet (5 mg total) by mouth daily.   Active   thiamine  (VITAMIN B-1) 50 MG tablet [id number]  Take 1 tablet (50 mg total) by mouth daily. Joshua Debby CROME, MD  Active   tirzepatide  (MOUNJARO ) 10 MG/0.5ML Pen [id number] Yes Inject 10 mg into the skin once a week. Thapa, [place],  MD  Active            Med Note ELI, JESSICA H   Thu Nov 14, 2023  9:41 AM) Thursdays            Assessment/Plan:   Diabetes: - Currently uncontrolled, A1c goal <7% - Average 190 - Reviewed goal A1c, goal fasting, and goal 2 hour post prandial glucose - Reviewed dietary modifications including eliminating high sugar beverages such as sodas and juices, encouraged increased protein/fiber and balanced meals/snacks - Reviewed lifestyle modifications including: increased movement/walking, strength training as able - Reviewed proper use of insulin . Novolog  to be taken 10-15 minutes prior to eating, which may help prevent high BG spikes after eating and low BGs later on. - Recommend to continue  Novolog  to 35 units TID before meals. Continue Tresiba , Mounjaro , Xigduo  - Recommend to continue focus on timing of Novolog  doses and adherence with Novolog  and Xigduo .  Med access: Xigduo  copay card at [facility] [id number] PCN: CN, GRP: [id number], ID: [id number]  Nexlizet  - already re-enrolled pt in copay card program, using same copay card as instructed  Appears Mounjaro  already has a copay card and his insulins are $0  Hypertension: - Currently borderline controlled, BP goal <130/80 - Reviewed long term cardiovascular and renal outcomes of uncontrolled blood pressure - Recommended to continue amlodipine , indapamide  and candesartan   - May need to increase indapamide  and/or candesartan  to 32 mg if BP remains elevated or borderline - Patient is going today for a cardiac CT   Follow Up Plan: 8/26  [name], PharmD Candidate  [facility], [employer]    [name], PharmD, [employer], CPP Clinical Pharmacist Practitioner [place] [facility] [phone]

## 2024-02-04 NOTE — Patient Instructions (Addendum)
 It was a pleasure speaking with you today!  We will follow up on 8/26   Feel free to call with any questions or concerns!  [name], PharmD Candidate  [employer], [employer]   [name], PharmD, [employer], CPP Clinical Pharmacist Practitioner [place] [facility] at [facility] [phone]

## 2024-02-11 ENCOUNTER — Other Ambulatory Visit (HOSPITAL_COMMUNITY): Payer: Self-pay

## 2024-02-11 ENCOUNTER — Other Ambulatory Visit: Payer: Self-pay

## 2024-02-12 ENCOUNTER — Other Ambulatory Visit: Payer: Self-pay

## 2024-02-12 ENCOUNTER — Other Ambulatory Visit (HOSPITAL_COMMUNITY): Payer: Self-pay

## 2024-02-12 ENCOUNTER — Encounter: Payer: Self-pay | Admitting: Pharmacist

## 2024-02-13 ENCOUNTER — Other Ambulatory Visit: Payer: Self-pay

## 2024-02-13 ENCOUNTER — Other Ambulatory Visit: Payer: Self-pay | Admitting: Endocrinology

## 2024-02-13 ENCOUNTER — Other Ambulatory Visit (HOSPITAL_COMMUNITY): Payer: Self-pay

## 2024-02-13 DIAGNOSIS — Z794 Long term (current) use of insulin: Secondary | ICD-10-CM

## 2024-02-13 MED ORDER — INSULIN PEN NEEDLE 32G X 6 MM MISC
1.0000 | Freq: Four times a day (QID) | 3 refills | Status: AC
  Filled 2024-02-13: qty 300, 75d supply, fill #0

## 2024-02-13 NOTE — Telephone Encounter (Signed)
 Refill request complete

## 2024-02-14 ENCOUNTER — Ambulatory Visit: Admitting: Dietician

## 2024-02-14 ENCOUNTER — Ambulatory Visit: Admitting: Endocrinology

## 2024-02-25 ENCOUNTER — Ambulatory Visit: Admitting: Pharmacist

## 2024-02-25 VITALS — BP 128/80 | Wt 330.4 lb

## 2024-02-25 DIAGNOSIS — Z794 Long term (current) use of insulin: Secondary | ICD-10-CM | POA: Diagnosis not present

## 2024-02-25 DIAGNOSIS — E119 Type 2 diabetes mellitus without complications: Secondary | ICD-10-CM | POA: Diagnosis not present

## 2024-02-25 DIAGNOSIS — Z23 Encounter for immunization: Secondary | ICD-10-CM

## 2024-02-25 DIAGNOSIS — I10 Essential (primary) hypertension: Secondary | ICD-10-CM | POA: Diagnosis not present

## 2024-02-25 NOTE — Patient Instructions (Addendum)
 It was a pleasure speaking with you today!  Start taking Amlodipine /Candesartan  combination medication.  Continue all other medications.   I will reach out to your endocrinologist about the CeQur device.   Feel free to call with any questions or concerns!  [name], PharmD Candidate  [employer], [employer]    [name], PharmD, [employer], CPP Clinical Pharmacist Practitioner  Primary [facility] at [facility] [phone]

## 2024-02-25 NOTE — Progress Notes (Unsigned)
 02/25/2024 Name: Stephen Hall MRN: [id number] DOB: [birth year]  Chief Complaint  Patient presents with   Diabetes   Hypertension   Medication Management    Stephen Hall is a 43 y.o. year old male who presented for a in office visit.   They were referred to the pharmacist by their PCP for assistance in managing diabetes and hypertension.    Subjective:  Care Team: Primary Care Provider: Joshua Debby CROME, MD ; Next Scheduled Visit: 04/29/24 Endocrinologist Dr. Mercie; Next Scheduled Visit: 04/17/24  Medication Access/Adherence  Current Pharmacy:  DARRYLE LONG - [facility] [address] [place] [address] Phone: [phone] Fax: [phone]   Patient reports affordability concerns with their medications: Yes  Patient reports access/transportation concerns to their pharmacy: No  Patient reports adherence concerns with their medications:  No    Diabetes: *Followed by endo - Dr. Mercie Current medications: Xigduo  XR 04/999 mg daily, Mounjaro  10 mg weekly (Thursdays), Tresiba  160 units per day in AM, Novolog  35 units TID AC - Has been better recently about taking Xigduo  daily  - Takes Novolog  1-2x/day - mainly uses with lunch, often forgets about using with dinner; having to carry pen & needles limits use; often takes at time of meals as it is difficult with his schedule to take before eating - Pt notes he does not take Novolog  every day, when he does use it, at least one time during the day.  - Patient notes that he is being more adherent to his medications. He said that he only missed two days in his 14 day pill box.    Medications tried in the past: Mounjaro  12.5 mg caused GI upset  Current glucose readings: Using Dexcom G7      Current meal patterns: *Pt notes he was on vacation the last 2 weeks and was no taking insulin  regularly Pt describes his diet as terrible because he is always eating on the go. Has tried to cut back on carbs  and eat smaller portion but notes the overall food choices are not ideal.   Eats 3-4 meals/day Breakfast - leftovers; meat, juice or soda Lunch - something similar to breakfast; adds tomato, lettuce, or something green; sweet tea Afternoon snack - whatever he can find Dinner - heartiest meal  Current physical activity: none outside of regular daily movement  Current medication access support: copay cards for Xigduo , Nexlizet , and Mounjaro   Hypertension: Current medications: candesartan  16 mg daily, amlodipine  5 mg daily, indapamide  1.25 mg daily  Patient does have a validated, automated, upper arm home BP cuff. - Has not checked BP since receiving new blood pressure cuff - In office blood pressure was 128/80  - Patient discussed desire to cut down on number of medications taken.  Of note, patient is going through the process to get approval for weight loss surgery  -Patient received second dose of HepB vaccine.   Objective:  Lab Results  Component Value Date   HGBA1C 7.8 01/28/2024    Lab Results  Component Value Date   CREATININE 1.10 01/28/2024   BUN 13 01/28/2024   NA 140 01/28/2024   K 3.5 01/28/2024   CL 101 01/28/2024   CO2 28 01/28/2024    Lab Results  Component Value Date   CHOL 149 09/23/2023   HDL 36.90 (L) 09/23/2023   LDLCALC 98 09/23/2023   LDLDIRECT 158.1 02/07/2012   TRIG 69.0 09/23/2023   CHOLHDL 4 09/23/2023    Medications Reviewed Today  Medications were not reviewed in this encounter     Assessment/Plan:   Diabetes: - Currently uncontrolled, A1c goal <7% - Average has worsened to 225, likely due to medication nonadherence - Reviewed goal A1c, goal fasting, and goal 2 hour post prandial glucose - Reviewed dietary modifications including eliminating high sugar beverages such as sodas and juices, encouraged increased protein/fiber and balanced meals/snacks - Reviewed lifestyle modifications including: increased movement/walking, strength  training as able - Reviewed proper use of insulin . Novolog  to be taken 10-15 minutes prior to eating, which may help prevent high BG spikes after eating and low BGs later on. - Recommend to continue Novolog  to 35 units TID before meals. Continue Tresiba , Mounjaro , Xigduo  - Recommend to continue focus on timing of Novolog  doses and adherence with Novolog  and Xigduo . - -Discussed CeQur Simplicity Patch with patient. He was interested in the device due to adherence problems with Novolog  while he is working. Will reach out to endocrinologist to discuss CeQur.  - Dr. Mercie in agreement with starting CeQur patch, Rx sent in to use with novolog  30 units or 15 clicks TID AC. Provided patient with information on how to use and sent instructional video. He will let me know if there are questions or concerns.  Med access: Xigduo  copay card at [facility] [id number] PCN: CN, GRP: [id number], ID: [id number]  Nexlizet  - already re-enrolled pt in copay card program, using same copay card as instructed  Appears Mounjaro  already has a copay card and his insulins are $0  Hypertension: - Currently borderline controlled, BP goal <130/80 - Reviewed long term cardiovascular and renal outcomes of uncontrolled blood pressure - Recommended to continue indapamide , change amlodipine  and candesartan  to amlodipine -olmesartan  5/20 mg daily to reduce pill burden    Follow Up Plan: 9/9  Stephen Hall, PharmD Candidate  [facility], [employer]    Stephen Hall, PharmD, [employer], CPP Clinical Pharmacist Practitioner [place] [facility] [phone]

## 2024-02-26 ENCOUNTER — Other Ambulatory Visit: Payer: Self-pay | Admitting: Endocrinology

## 2024-02-26 ENCOUNTER — Other Ambulatory Visit: Payer: Self-pay

## 2024-02-26 ENCOUNTER — Other Ambulatory Visit (HOSPITAL_COMMUNITY): Payer: Self-pay

## 2024-02-26 DIAGNOSIS — Z794 Long term (current) use of insulin: Secondary | ICD-10-CM

## 2024-02-26 MED ORDER — INSULIN ASPART 100 UNIT/ML IJ SOLN
INTRAMUSCULAR | 3 refills | Status: AC
  Filled 2024-02-26 – 2024-02-27 (×2): qty 50, 55d supply, fill #0

## 2024-02-26 MED ORDER — AMLODIPINE-OLMESARTAN 5-20 MG PO TABS
1.0000 | ORAL_TABLET | Freq: Every day | ORAL | 0 refills | Status: AC
  Filled 2024-02-26: qty 90, 90d supply, fill #0

## 2024-02-26 MED ORDER — CEQUR SIMPLICITY 2U DEVI
11 refills | Status: AC
  Filled 2024-02-26: qty 2, 32d supply, fill #0
  Filled 2024-02-27: qty 16, 32d supply, fill #0

## 2024-02-26 MED ORDER — CEQUR SIMPLICITY INSERTER MISC
0 refills | Status: AC
Start: 2024-02-26 — End: ?
  Filled 2024-02-26 – 2024-02-27 (×2): qty 1, 30d supply, fill #0

## 2024-02-27 ENCOUNTER — Other Ambulatory Visit (HOSPITAL_COMMUNITY): Payer: Self-pay

## 2024-02-27 ENCOUNTER — Other Ambulatory Visit: Payer: Self-pay

## 2024-03-04 ENCOUNTER — Other Ambulatory Visit (HOSPITAL_COMMUNITY): Payer: Self-pay

## 2024-03-10 ENCOUNTER — Other Ambulatory Visit (INDEPENDENT_AMBULATORY_CARE_PROVIDER_SITE_OTHER): Admitting: Pharmacist

## 2024-03-10 DIAGNOSIS — Z7985 Long-term (current) use of injectable non-insulin antidiabetic drugs: Secondary | ICD-10-CM

## 2024-03-10 DIAGNOSIS — I10 Essential (primary) hypertension: Secondary | ICD-10-CM

## 2024-03-10 DIAGNOSIS — E119 Type 2 diabetes mellitus without complications: Secondary | ICD-10-CM

## 2024-03-10 DIAGNOSIS — Z794 Long term (current) use of insulin: Secondary | ICD-10-CM

## 2024-03-10 NOTE — Patient Instructions (Signed)
 It was a pleasure speaking with you today!  Continue your current regimen. Focus on taking medications consistently and start using CeQur insulin  patch to help with taking Novolog  more conveniently during the day.  Feel free to call with any questions or concerns!  [name], PharmD, BCPS, CPP Clinical Pharmacist Practitioner [place] [facility] [phone]

## 2024-03-10 NOTE — Progress Notes (Signed)
 03/10/2024 Name: Stephen Hall MRN: [id number] DOB: August 01, 1980  Chief Complaint  Patient presents with   Diabetes   Medication Management   Hypertension    Stephen Hall is a 43 y.o. year old male who presented for a in office visit.   They were referred to the pharmacist by their PCP for assistance in managing diabetes and hypertension.    Subjective:  Care Team: Primary Care Provider: Joshua Debby CROME, MD ; Next Scheduled Visit: 04/29/24 Endocrinologist Dr. Mercie; Next Scheduled Visit: 04/17/24  Medication Access/Adherence  Current Pharmacy:  [facility] - [facility] [address] [place] [address] Phone: [phone] Fax: [phone]   Patient reports affordability concerns with their medications: Yes  Patient reports access/transportation concerns to their pharmacy: No  Patient reports adherence concerns with their medications:  No    Diabetes: *Followed by endo - Dr. Mercie Current medications: Xigduo  XR 04/999 mg daily, Mounjaro  10 mg weekly (Thursdays), Tresiba  160 units per day in AM, Novolog  35 units TID AC - Has been better recently about taking Xigduo  daily  - Patient notes that he is being more adherent to his oral medications. He said that he only missed two days in his 14 day pill box. He has, however, been less adherent with his insulin  injections lately. - Has not yet started using CeQur insulin  patch   Medications tried in the past: Mounjaro  12.5 mg caused GI upset  Current glucose readings: Using Dexcom G7      Current meal patterns:  Pt describes his diet as terrible because he is always eating on the go. Has tried to cut back on carbs and eat smaller portion but notes the overall food choices are not ideal.   Eats 3-4 meals/day Breakfast - leftovers; meat, juice or soda Lunch - something similar to breakfast; adds tomato, lettuce, or something green; sweet tea Afternoon snack - whatever he can find Dinner -  heartiest meal  Current physical activity: has been swimming and exercising  Current medication access support: copay cards for Xigduo , Nexlizet , and Mounjaro   Hypertension: Current medications: amlodipine -olmesartan  5-20 mg daily, indapamide  1.25 mg daily  Patient does have a validated, automated, upper arm home BP cuff. - Has not checked BP since receiving new blood pressure cuff: 119/78 - Denies dizziness.  Of note, patient is going through the process to get approval for weight loss surgery    Objective:  Lab Results  Component Value Date   HGBA1C 7.8 01/28/2024    Lab Results  Component Value Date   CREATININE 1.10 01/28/2024   BUN 13 01/28/2024   NA 140 01/28/2024   K 3.5 01/28/2024   CL 101 01/28/2024   CO2 28 01/28/2024    Lab Results  Component Value Date   CHOL 149 09/23/2023   HDL 36.90 (L) 09/23/2023   LDLCALC 98 09/23/2023   LDLDIRECT 158.1 02/07/2012   TRIG 69.0 09/23/2023   CHOLHDL 4 09/23/2023    Medications Reviewed Today     Reviewed by Merceda Lela SAUNDERS, RPH (Pharmacist) on 03/10/24 at 1400  Med List Status: <None>   Medication Order Taking? Sig Documenting Provider Last Dose Status Informant  albuterol  (VENTOLIN  HFA) 108 (90 Base) MCG/ACT inhaler [id number]  Inhale 2 puffs into the lungs every 6 (six) hours as needed for wheezing or shortness of breath. Kennyth Domino, FNP  Active   amLODipine -olmesartan  (AZOR ) 5-20 MG tablet [id number] Yes Take 1 tablet by mouth daily. To replace amlodipine  and candesartan  Joshua Debby  L, MD  Active   Bempedoic Acid -Ezetimibe  (NEXLIZET ) 180-10 MG TABS [id number]  Take 1 tablet by mouth daily. Joshua Debby CROME, MD  Active   Continuous Glucose Sensor (DEXCOM G7 SENSOR) MISC [id number]  Change sensor every 10 days Thapa, [place], MD  Active   Dapagliflozin  Pro-metFORMIN  ER (XIGDUO  XR) 04-999 MG TB24 [id number] Yes Take 1 tablet by mouth daily. Thapa, [place], MD  Active   Glucagon  (GVOKE HYPOPEN  2-PACK) 1 MG/0.2ML EMMANUEL  [id number]  Inject 1 pen into the skin daily as needed for low blood sugar Joshua Debby CROME, MD  Active   indapamide  (LOZOL ) 1.25 MG tablet [id number] Yes Take 1 tablet (1.25 mg total) by mouth daily. Joshua Debby CROME, MD  Active   injection device for insulin  [facility] SIMPLICITY 2U) DEVI [id number]  Change every 2 days , take 15 clicks before meals 3 times a day. Thapa, [place], MD  Active   Injection Device for Insulin  (CEQUR SIMPLICITY INSERTER) MISC [id number]  Use as advised Thapa, [place], MD  Active   insulin  aspart (NOVOLOG  FLEXPEN) 100 UNIT/ML FlexPen [id number] Yes Inject 35 Units into the skin 3 (three) times daily with meals. Joshua Debby CROME, MD  Active   insulin  aspart (NOVOLOG ) 100 UNIT/ML injection [id number]  Use up to 90 units / day via Cequr device. Thapa, [place], MD  Active   insulin  degludec (TRESIBA  FLEXTOUCH) 200 UNIT/ML FlexTouch Pen [id number] Yes Inject 160 Units into the skin daily. Thapa, [place], MD  Active   Insulin  Pen Needle 32G X 6 MM MISC [id number]  Use as directed morning, noon, in the evening and at bedtime. Thapa, [place], MD  Active   Lancets [facility] DELICA PLUS [place]) MISC [id number]  USE TO CHECK BLOOD GLUCOSE THREE TIMES DAILY Kassie Mallick, MD  Active   metoprolol  tartrate (LOPRESSOR ) 100 MG tablet [id number]  Take tablet (100mg ) TWO hours prior to your cardiac CT scan. Barbaraann Darryle Debby, MD  Active   rosuvastatin  (CRESTOR ) 10 MG tablet [id number]  Take 1 tablet (10 mg total) by mouth daily. Joshua Debby CROME, MD  Active   sildenafil  (VIAGRA ) 100 MG tablet [id number]  Take 1 tablet (100 mg total) by mouth daily as needed for erectile dysfunction   Active   tadalafil  (CIALIS ) 5 MG tablet [id number]  Take 1 tablet (5 mg total) by mouth daily.   Active   thiamine  (VITAMIN B-1) 50 MG tablet [id number]  Take 1 tablet (50 mg total) by mouth daily. Joshua Debby CROME, MD  Active   tirzepatide  (MOUNJARO ) 10 MG/0.5ML Pen [id number] Yes Inject 10 mg into the skin once a week. Thapa, [place], MD   Active            Med Note ELI, JESSICA H   Thu Nov 14, 2023  9:41 AM) Thursdays            Assessment/Plan:   Diabetes: - Currently uncontrolled, A1c goal <7% - Average has worsened to 227, likely due to medication nonadherence - Reviewed goal A1c, goal fasting, and goal 2 hour post prandial glucose - Reviewed dietary modifications including eliminating high sugar beverages such as sodas and juices, encouraged increased protein/fiber and balanced meals/snacks - Reviewed lifestyle modifications including: increased movement/walking, strength training as able - Reviewed proper use of insulin . Novolog  to be taken 10-15 minutes prior to eating, which may help prevent high BG spikes after eating and low BGs later on. - Recommend to continue Novolog  to 35 units TID before meals. Continue Tresiba , Mounjaro , Xigduo  - Recommend  to continue focus on timing of Novolog  doses and adherence with Novolog  and Xigduo . - Encouraged to start using CeQur insulin  patch to improve adherence and convenience taking Novolog  more consistently.  Med access: Xigduo  copay card at [facility] [id number] PCN: [id number], GRP: [id number], ID: [id number]  Nexlizet  - already re-enrolled pt in copay card program, using same copay card as instructed  Appears Mounjaro  already has a copay card and his insulins are $0  Hypertension: - Currently controlled, BP goal <130/80. BP improved.  - Reviewed long term cardiovascular and renal outcomes of uncontrolled blood pressure - Recommended to continue amlodipine -olmesartan  5/20 mg daily and indapamide     Follow Up Plan: 9/23   [name], PharmD, BCPS, CPP Clinical Pharmacist Practitioner  Primary Care at [facility] [phone]

## 2024-03-24 ENCOUNTER — Other Ambulatory Visit

## 2024-03-24 NOTE — Progress Notes (Signed)
 Pt sent MyChart message noting he could not be available for 9/23 follow up. He notes he has been using the CeQur patch. Reviewed pt's Dexcom data for previous 2 weeks.   BG average has improved to 178. Pt is having some lows, possibly due to more frequent Novolog  use. Will recommend trial of reduce Novolog  to 13 clicks instead of 15 prior to meals. F/u in 2 weeks.  [name], PharmD, BCPS, CPP Clinical Pharmacist Practitioner [place] [facility] [phone]

## 2024-03-30 ENCOUNTER — Encounter: Payer: Self-pay | Admitting: Endocrinology

## 2024-03-30 ENCOUNTER — Ambulatory Visit: Admitting: Endocrinology

## 2024-03-30 VITALS — BP 118/80 | HR 93 | Resp 20 | Ht 77.0 in | Wt 329.8 lb

## 2024-03-30 DIAGNOSIS — Z794 Long term (current) use of insulin: Secondary | ICD-10-CM | POA: Diagnosis not present

## 2024-03-30 DIAGNOSIS — E1165 Type 2 diabetes mellitus with hyperglycemia: Secondary | ICD-10-CM

## 2024-03-30 NOTE — Progress Notes (Signed)
 Outpatient Endocrinology Note Stephen Davelyn Gwinn, MD   Patient's Name: Stephen Hall    DOB: Nov 27, 1980    MRN: [id number]                                                    REASON OF VISIT: Follow up for type 2 diabetes mellitus  PCP: Joshua Debby CROME, MD  HISTORY OF PRESENT ILLNESS:   Jamarien Rodkey is a 43 y.o. old male with past medical history listed below, is here for follow up for type 2 diabetes mellitus.   Pertinent Diabetes History: Patient was previously seen by Dr. Von and was last time seen in August 2024.  Patient was diagnosed with type 2 diabetes mellitus in 2015.  Insulin  therapy was started in 2017.  He has uncontrolled type 2 diabetes mellitus.  No personal history of pancreatitis and / or family history of medullary thyroid  carcinoma or MEN 2B syndrome.   Chronic Diabetes Complications : Retinopathy: no. Last ophthalmology exam was done on 12/2022, following with ophthalmology regularly.  Nephropathy: no, on ACE/ARB /candesartan  Peripheral neuropathy: no Coronary artery disease: no Stroke: no  Relevant comorbidities and cardiovascular risk factors: Obesity: yes Body mass index is 39.11 kg/m.  Hypertension: Yes  Hyperlipidemia : Yes, on statin   Current / Home Diabetic regimen includes:  Non-insulin  hypoglycemic drugs: Mounjaro  10 mg weekly, Xigduo  10/998 mg 1 tablet daily     Insulin  regimen: 160 units of U-200 Tresiba  daily, NovoLog  30 units before meals usually 2 times a day using Cequr you, taking with lunch and supper.    Prior diabetic medications: Soliqua , Lantus  insulin , Humalog  , Prandin ,? Metformin .  GI upset with higher dose of Mounjaro .  Glycemic data:    CONTINUOUS GLUCOSE MONITORING SYSTEM (CGMS) INTERPRETATION: At today's visit, we reviewed CGM downloads. The full report is scanned in the media. Reviewing the CGM trends, blood glucose are as follows:  Dexcom G7 CGM-  Sensor Download (Sensor download was reviewed and summarized  below.) Dates: September 16 to September 29 , 2025, 14 days  Sensor use is 95%  Glucose Management Indicator: 8.1%    Previous :    Interpretation: Variable blood sugars some of the days acceptable blood sugar and some other days postprandial hyperglycemia with blood sugar up to 250-300 range.  Blood sugar in between the meals and overnight mostly acceptable.  No concerning hypoglycemia.  Hypoglycemia: Patient has no hypoglycemic episodes. Patient has hypoglycemia awareness.  Factors modifying glucose control: 1.  Diabetic diet assessment: 3 meals a day.  Drinking regular sodas.  Frequent fast food.  2.  Staying active or exercising: No formal exercise.  3.  Medication compliance: compliant most of the time.  Other: He has obstructive sleep apnea and on CPAP.  Interval history  CGM data as reviewed above.  Diabetes regimen as reviewed and noted above.  He has complaints of abdominal fullness and GERD symptoms, 120 stay on current dose of Mounjaro .  Currently taking 10 mg weekly.  He started to use Cequr, had visit with dietician /pharmacist in between the visits.  He has been mostly using 15 clicks for meals 2 times a day.  Hemoglobin A1c was slightly improved to 7.8% in the end of July.  No other complaints today.  REVIEW OF SYSTEMS As per history of present illness.   PAST MEDICAL HISTORY:  Past Medical History:  Diagnosis Date   Asthma    Diabetes mellitus without complication (HCC)    Environmental allergies    Hyperlipidemia    Hypertension     PAST SURGICAL HISTORY: History reviewed. No pertinent surgical history.  ALLERGIES: No Known Allergies  FAMILY HISTORY:  Family History  Problem Relation Age of Onset   Alcohol abuse Other    Arthritis Other    Hypertension Other    Diabetes Other    Obesity Mother    Hypertension Mother    Obesity Father    Hypertension Father    Cancer Neg Hx    Heart disease Neg Hx    Stroke Neg Hx    Kidney disease Neg  Hx     SOCIAL HISTORY: Social History   Socioeconomic History   Marital status: Married    Spouse name: Not on file   Number of children: Not on file   Years of education: Not on file   Highest education level: Not on file  Occupational History   Not on file  Tobacco Use   Smoking status: Never   Smokeless tobacco: Never  Substance and Sexual Activity   Alcohol use: No   Drug use: No   Sexual activity: Yes  Other Topics Concern   Not on file  Social History Narrative   Caffienated drinks-yes   Seat belt use often-yes   Regular Exercise-no   Smoke alarm in the home-yes   Firearms/guns in the home-no   History of physical abuse-no               Social Drivers of Corporate investment banker Strain: Not on file  Food Insecurity: Not on file  Transportation Needs: Not on file  Physical Activity: Not on file  Stress: Not on file  Social Connections: Not on file    MEDICATIONS:  Current Outpatient Medications  Medication Sig Dispense Refill   albuterol  (VENTOLIN  HFA) 108 (90 Base) MCG/ACT inhaler Inhale 2 puffs into the lungs every 6 (six) hours as needed for wheezing or shortness of breath. 7.5 g 0   amLODipine -olmesartan  (AZOR ) 5-20 MG tablet Take 1 tablet by mouth daily. To replace amlodipine  and candesartan  90 tablet 0   Bempedoic Acid -Ezetimibe  (NEXLIZET ) 180-10 MG TABS Take 1 tablet by mouth daily. 90 tablet 1   Continuous Glucose Sensor (DEXCOM G7 SENSOR) MISC Change sensor every 10 days 3 each 3   Dapagliflozin  Pro-metFORMIN  ER (XIGDUO  XR) 04-999 MG TB24 Take 1 tablet by mouth daily. 90 tablet 3   Glucagon  (GVOKE HYPOPEN  2-PACK) 1 MG/0.2ML SOAJ Inject 1 pen into the skin daily as needed for low blood sugar 0.4 mL 5   indapamide  (LOZOL ) 1.25 MG tablet Take 1 tablet (1.25 mg total) by mouth daily. 90 tablet 1   injection device for insulin  (CEQUR SIMPLICITY 2U) DEVI Change every 2 days , take 15 clicks before meals 3 times a day. 16 each 11   Injection Device  for Insulin  (CEQUR SIMPLICITY INSERTER) MISC Use as advised 1 each 0   insulin  aspart (NOVOLOG  FLEXPEN) 100 UNIT/ML FlexPen Inject 35 Units into the skin 3 (three) times daily with meals.     insulin  aspart (NOVOLOG ) 100 UNIT/ML injection Use up to 90 units / day via Cequr device. 50 mL 3   insulin  degludec (TRESIBA  FLEXTOUCH) 200 UNIT/ML FlexTouch Pen Inject 160 Units into the skin daily. 81 mL 3   Insulin  Pen Needle 32G X 6 MM MISC Use as  directed morning, noon, in the evening and at bedtime. 300 each 3   Lancets (ONETOUCH DELICA PLUS LANCET33G) MISC USE TO CHECK BLOOD GLUCOSE THREE TIMES DAILY 100 each 1   metoprolol  tartrate (LOPRESSOR ) 100 MG tablet Take tablet (100mg ) TWO hours prior to your cardiac CT scan. 1 tablet 0   rosuvastatin  (CRESTOR ) 10 MG tablet Take 1 tablet (10 mg total) by mouth daily. 90 tablet 1   sildenafil  (VIAGRA ) 100 MG tablet Take 1 tablet (100 mg total) by mouth daily as needed for erectile dysfunction 30 tablet 3   tadalafil  (CIALIS ) 5 MG tablet Take 1 tablet (5 mg total) by mouth daily. 90 tablet 3   thiamine  (VITAMIN B-1) 50 MG tablet Take 1 tablet (50 mg total) by mouth daily. 100 tablet 1   tirzepatide  (MOUNJARO ) 10 MG/0.5ML Pen Inject 10 mg into the skin once a week. 9 mL 3   No current facility-administered medications for this visit.    PHYSICAL EXAM: Vitals:   03/30/24 1056 03/30/24 1057  BP: (!) 142/92 118/80  Pulse: 93   Resp: 20   SpO2: 96%   Weight: (!) 329 lb 12.8 oz (149.6 kg)   Height: 6' 5 (1.956 m)     Body mass index is 39.11 kg/m.  Wt Readings from Last 3 Encounters:  03/30/24 (!) 329 lb 12.8 oz (149.6 kg)  02/25/24 (!) 330 lb 6.4 oz (149.9 kg)  01/31/24 (!) 328 lb 8 oz (149 kg)    General: Well developed, well nourished male in no apparent distress.  HEENT: AT/[place], no external lesions.  Eyes: Conjunctiva clear and no icterus. Neck: Neck supple  Lungs: Respirations not labored Neurologic: Alert, oriented, normal  speech Extremities / Skin: Dry.  Psychiatric: Does not appear depressed or anxious  Diabetic Foot Exam - Simple   No data filed    LABS Reviewed Lab Results  Component Value Date   HGBA1C 7.8 01/28/2024   HGBA1C 8.9 (A) 10/25/2023   HGBA1C 9.6 (A) 07/19/2023   Lab Results  Component Value Date   FRUCTOSAMINE 316 (H) 03/07/2022   FRUCTOSAMINE 275 10/26/2020   FRUCTOSAMINE 209 07/23/2016   Lab Results  Component Value Date   CHOL 149 09/23/2023   HDL 36.90 (L) 09/23/2023   LDLCALC 98 09/23/2023   LDLDIRECT 158.1 02/07/2012   TRIG 69.0 09/23/2023   CHOLHDL 4 09/23/2023   Lab Results  Component Value Date   MICRALBCREAT Unable to calculate 09/23/2023   Lab Results  Component Value Date   CREATININE 1.10 01/28/2024   Lab Results  Component Value Date   GFR 83.22 01/28/2024    ASSESSMENT / PLAN  1. Uncontrolled type 2 diabetes mellitus with hyperglycemia, with long-term current use of insulin  (HCC)     Diabetes Mellitus type 2, complicated by no known complications. - Diabetic status / severity: Uncontrolled, improving.   Lab Results  Component Value Date   HGBA1C 7.8 01/28/2024    - Hemoglobin A1c goal : <6.5%  Discussed about importance of controlling diabetes to prevent chronic complications including diabetic retinopathy, neuropathy and nephropathy.  Discussed about compliance with diabetes regimen.  Discussed about taking NovoLog  with each meals breakfast, lunch and supper and asked to take 10 to 15 minutes before.  He has been now using Cequr.  Discussed about increasing dose of Mounjaro , will help to require low-dose of insulin  and also help her lose weight.  Patient wants to stay on current dose for now.  Adjusted diabetes regimen as follows.  -  Medications: See below  I) continue Tresiba  160 units daily. II) advised to take NovoLog  from 30 -40 units with meals 3 times a day, 10 to 15 minutes before eating.  15-20 clicks using CeQur device. III)  continue Mounjaro  10 mg weekly. IV) continue Xigduo  10/998 mg 1 tablet daily  - Home glucose testing: Dexcom G7 and check as needed.  - Discussed/ Gave Hypoglycemia treatment plan.  # Consult : not required at this time.   # Annual urine for microalbuminuria/ creatinine ratio, no microalbuminuria currently, continue ACE/ARB / candesartan . Last  Lab Results  Component Value Date   MICRALBCREAT Unable to calculate 09/23/2023    # Foot check nightly.  # Annual dilated diabetic eye exams.   - Diet: Make healthy diabetic food choices.  Discussed in detail.  Asked to avoid regular soft drinks.  Asked to limit carbohydrates and fast food. - Life style / activity / exercise: Discussed.  2. Blood pressure  -  BP Readings from Last 1 Encounters:  03/30/24 118/80    - Control is in target.  - No change in current plans.  3. Lipid status / Hyperlipidemia - Last  Lab Results  Component Value Date   LDLCALC 98 09/23/2023   - Continue Nexlizet .  Previously on rosuvastatin .  Managed by primary care provider.  # Morbid obesity : Was previously referred to bariatric surgery /bariatric center for potential bariatric surgery.  Patient is asked to call the clinic and make appointment.  Bariatric surgery requested form completed and provided to the patient in the clinic the last visit.  He had seen bariatric surgeon in May.  Diagnoses and all orders for this visit:  Uncontrolled type 2 diabetes mellitus with hyperglycemia, with long-term current use of insulin  (HCC)     DISPOSITION Follow up in clinic in 3 months suggested.   All questions answered and patient verbalized understanding of the plan.  Stephen Saadiq Poche, MD [facility] Endocrinology [facility] [address] [place], [address] [place], [place] [address] Phone # [phone]  At least part of this note was generated using voice recognition software. Inadvertent word errors may have occurred, which were not recognized  during the proofreading process.

## 2024-04-03 ENCOUNTER — Other Ambulatory Visit: Payer: Self-pay

## 2024-04-03 ENCOUNTER — Other Ambulatory Visit (HOSPITAL_COMMUNITY): Payer: Self-pay

## 2024-04-06 ENCOUNTER — Other Ambulatory Visit: Payer: Self-pay

## 2024-04-07 ENCOUNTER — Encounter: Payer: Self-pay | Admitting: Pharmacist

## 2024-04-07 ENCOUNTER — Other Ambulatory Visit: Admitting: Pharmacist

## 2024-04-07 ENCOUNTER — Other Ambulatory Visit: Payer: Self-pay

## 2024-04-07 DIAGNOSIS — Z794 Long term (current) use of insulin: Secondary | ICD-10-CM

## 2024-04-07 DIAGNOSIS — E119 Type 2 diabetes mellitus without complications: Secondary | ICD-10-CM

## 2024-04-07 DIAGNOSIS — I10 Essential (primary) hypertension: Secondary | ICD-10-CM

## 2024-04-07 NOTE — Progress Notes (Signed)
 04/07/2024 Name: Stephen Hall MRN: [id number] DOB: 15-Mar-1981  Chief Complaint  Patient presents with   Diabetes   Medication Management    Etienne Mowers is a 43 y.o. year old male who presented for a in office visit.   They were referred to the pharmacist by their PCP for assistance in managing diabetes and hypertension.    Subjective:  Care Team: Primary Care Provider: Joshua Debby CROME, MD ; Next Scheduled Visit: 04/29/24 Endocrinologist Dr. Mercie; Next Scheduled Visit: 04/17/24  Medication Access/Adherence  Current Pharmacy:  [facility] - [facility] [address] [place] [address] Phone: [phone] Fax: [phone]   Patient reports affordability concerns with their medications: Yes  Patient reports access/transportation concerns to their pharmacy: No  Patient reports adherence concerns with their medications:  No    Diabetes: *Followed by endo - Dr. Mercie Current medications: Xigduo  XR 04/999 mg daily, Mounjaro  10 mg weekly (Thursdays), Tresiba  160 units per day in AM, Novolog  30 units TID AC using CeQur insulin  patch - Has been better recently about taking Xigduo  daily  - Patient notes that he is being more adherent to his oral medications. He said that he only missed two days in his 14 day pill box. He has, however, been less adherent with his insulin  injections lately.    Medications tried in the past: Mounjaro  12.5 mg caused GI upset  Current glucose readings: Using Dexcom G7      Current meal patterns:  Pt describes his diet as terrible because he is always eating on the go. Has tried to cut back on carbs and eat smaller portion but notes the overall food choices are not ideal.   Eats 3-4 meals/day Breakfast - leftovers; meat, juice or soda Lunch - something similar to breakfast; adds tomato, lettuce, or something green; sweet tea Afternoon snack - whatever he can find Dinner - heartiest meal  Current physical  activity: has been swimming and exercising  Current medication access support: copay cards for Xigduo , Nexlizet , and Mounjaro   Hypertension: Current medications: amlodipine -olmesartan  5-20 mg daily, indapamide  1.25 mg daily  Patient does have a validated, automated, upper arm home BP cuff. - Has not checked BP since receiving new blood pressure cuff: 119/78 - Denies dizziness.  Of note, patient is going through the process to get approval for weight loss surgery    Objective:  BP Readings from Last 3 Encounters:  03/30/24 118/80  02/25/24 128/80  02/04/24 107/65     Lab Results  Component Value Date   HGBA1C 7.8 01/28/2024    Lab Results  Component Value Date   CREATININE 1.10 01/28/2024   BUN 13 01/28/2024   NA 140 01/28/2024   K 3.5 01/28/2024   CL 101 01/28/2024   CO2 28 01/28/2024    Lab Results  Component Value Date   CHOL 149 09/23/2023   HDL 36.90 (L) 09/23/2023   LDLCALC 98 09/23/2023   LDLDIRECT 158.1 02/07/2012   TRIG 69.0 09/23/2023   CHOLHDL 4 09/23/2023    Medications Reviewed Today   Medications were not reviewed in this encounter     Assessment/Plan:   Diabetes: - Currently uncontrolled, A1c goal <7% - Average on Dexcom has remained uncontrolled however BG for the last 5 days has been improved with 7 day average of 163 - Reviewed goal A1c, goal fasting, and goal 2 hour post prandial glucose - Reviewed dietary modifications including eliminating high sugar beverages such as sodas and juices, encouraged increased protein/fiber  and balanced meals/snacks - Reviewed lifestyle modifications including: increased movement/walking, strength training as able - Reviewed proper use of insulin . Novolog  to be taken 10-15 minutes prior to eating, which may help prevent high BG spikes after eating and low BGs later on. - Recommend to continue Novolog  to 30 units TID before meals. Continue Tresiba , Mounjaro , Xigduo  - Recommend to continue focus on timing of  Novolog  doses and adherence with Novolog  and Xigduo . - Encouraged to continue using CeQur insulin  patch for improved Novolog  adherence  Med access: Xigduo  copay card at [facility] [id number] PCN: CN, GRP: [id number], ID: [id number]  Nexlizet  - already re-enrolled pt in copay card program, using same copay card as instructed  Appears Mounjaro  already has a copay card and his insulins are $0  Hypertension: - Currently controlled, BP goal <130/80. BP improved.  - Reviewed long term cardiovascular and renal outcomes of uncontrolled blood pressure - Recommended to continue amlodipine -olmesartan  5/20 mg daily and indapamide     Follow Up Plan: 11/4   [name], PharmD, BCPS, CPP Clinical Pharmacist Practitioner [facility] [phone]

## 2024-04-09 ENCOUNTER — Other Ambulatory Visit: Payer: Self-pay

## 2024-04-09 ENCOUNTER — Ambulatory Visit: Admitting: Dietician

## 2024-04-10 ENCOUNTER — Other Ambulatory Visit: Payer: Self-pay

## 2024-04-17 ENCOUNTER — Ambulatory Visit: Admitting: Endocrinology

## 2024-04-29 ENCOUNTER — Other Ambulatory Visit: Payer: Self-pay | Admitting: Internal Medicine

## 2024-04-29 ENCOUNTER — Ambulatory Visit: Admitting: Internal Medicine

## 2024-04-29 ENCOUNTER — Encounter: Payer: Self-pay | Admitting: Internal Medicine

## 2024-04-29 ENCOUNTER — Ambulatory Visit: Payer: Self-pay | Admitting: Internal Medicine

## 2024-04-29 ENCOUNTER — Other Ambulatory Visit (HOSPITAL_COMMUNITY): Payer: Self-pay

## 2024-04-29 ENCOUNTER — Telehealth (HOSPITAL_COMMUNITY): Payer: Self-pay

## 2024-04-29 VITALS — BP 124/82 | HR 94 | Temp 98.8°F | Resp 16 | Ht 77.0 in | Wt 325.8 lb

## 2024-04-29 DIAGNOSIS — I10 Essential (primary) hypertension: Secondary | ICD-10-CM | POA: Diagnosis not present

## 2024-04-29 DIAGNOSIS — E785 Hyperlipidemia, unspecified: Secondary | ICD-10-CM

## 2024-04-29 DIAGNOSIS — E119 Type 2 diabetes mellitus without complications: Secondary | ICD-10-CM | POA: Diagnosis not present

## 2024-04-29 DIAGNOSIS — E5111 Dry beriberi: Secondary | ICD-10-CM | POA: Diagnosis not present

## 2024-04-29 DIAGNOSIS — D539 Nutritional anemia, unspecified: Secondary | ICD-10-CM

## 2024-04-29 DIAGNOSIS — E611 Iron deficiency: Secondary | ICD-10-CM

## 2024-04-29 DIAGNOSIS — E78011 Heterozygous familial hypercholesterolemia (hefh): Secondary | ICD-10-CM

## 2024-04-29 DIAGNOSIS — Z794 Long term (current) use of insulin: Secondary | ICD-10-CM | POA: Diagnosis not present

## 2024-04-29 LAB — LIPID PANEL
Cholesterol: 136 mg/dL (ref 0–200)
HDL: 39.8 mg/dL (ref >39.00)
LDL Cholesterol: 80 mg/dL (ref 0–99)
NonHDL: 95.82
Total CHOL/HDL Ratio: 3
Triglycerides: 80 mg/dL (ref 0.0–149.0)
VLDL: 16 mg/dL (ref 0.0–40.0)

## 2024-04-29 LAB — BASIC METABOLIC PANEL WITH GFR
BUN: 12 mg/dL (ref 6–23)
CO2: 27 meq/L (ref 19–32)
Calcium: 9.3 mg/dL (ref 8.4–10.5)
Chloride: 100 meq/L (ref 96–112)
Creatinine, Ser: 0.96 mg/dL (ref 0.40–1.50)
GFR: 97.82 mL/min (ref >60.00)
Glucose, Bld: 147 mg/dL — ABNORMAL HIGH (ref 70–99)
Potassium: 4.4 meq/L (ref 3.5–5.1)
Sodium: 137 meq/L (ref 135–145)

## 2024-04-29 LAB — CBC WITH DIFFERENTIAL/PLATELET
Basophils Absolute: 0.1 K/uL (ref 0.0–0.1)
Basophils Relative: 0.6 % (ref 0.0–3.0)
Eosinophils Absolute: 0.2 K/uL (ref 0.0–0.7)
Eosinophils Relative: 2 % (ref 0.0–5.0)
HCT: 41.7 % (ref 39.0–52.0)
Hemoglobin: 13.4 g/dL (ref 13.0–17.0)
Lymphocytes Relative: 24.1 % (ref 12.0–46.0)
Lymphs Abs: 2.3 K/uL (ref 0.7–4.0)
MCHC: 32.2 g/dL (ref 30.0–36.0)
MCV: 71.9 fl — ABNORMAL LOW (ref 78.0–100.0)
Monocytes Absolute: 0.6 K/uL (ref 0.1–1.0)
Monocytes Relative: 6.5 % (ref 3.0–12.0)
Neutro Abs: 6.5 K/uL (ref 1.4–7.7)
Neutrophils Relative %: 66.8 % (ref 43.0–77.0)
Platelets: 309 K/uL (ref 150.0–400.0)
RBC: 5.8 Mil/uL (ref 4.22–5.81)
RDW: 17.2 % — ABNORMAL HIGH (ref 11.5–15.5)
WBC: 9.8 K/uL (ref 4.0–10.5)

## 2024-04-29 LAB — IBC + FERRITIN
Ferritin: 13.8 ng/mL — ABNORMAL LOW (ref 22.0–322.0)
Iron: 23 ug/dL — ABNORMAL LOW (ref 42–165)
Saturation Ratios: 5 % — ABNORMAL LOW (ref 20.0–50.0)
TIBC: 459.2 ug/dL — ABNORMAL HIGH (ref 250.0–450.0)
Transferrin: 328 mg/dL (ref 212.0–360.0)

## 2024-04-29 LAB — RETICULOCYTES
ABS Retic: 71160 {cells}/uL (ref 25000–90000)
Retic Ct Pct: 1.2 %

## 2024-04-29 LAB — VITAMIN B12: Vitamin B-12: 261 pg/mL (ref 211–911)

## 2024-04-29 LAB — FOLATE: Folate: 7.4 ng/mL (ref >5.9)

## 2024-04-29 LAB — HEMOGLOBIN A1C: Hgb A1c MFr Bld: 10.2 % — ABNORMAL HIGH (ref 4.6–6.5)

## 2024-04-29 LAB — CK: Total CK: 136 U/L (ref 17–232)

## 2024-04-29 MED ORDER — ACCRUFER 30 MG PO CAPS
30.0000 mg | ORAL_CAPSULE | Freq: Two times a day (BID) | ORAL | 0 refills | Status: AC
  Filled 2024-04-29: qty 60, 30d supply, fill #0

## 2024-04-29 NOTE — Patient Instructions (Signed)

## 2024-04-29 NOTE — Progress Notes (Signed)
 "  Subjective:  Patient ID: Stephen Hall, male    DOB: Dec 02, 1980  Age: 43 y.o. MRN: [id number]  CC: Hypertension, Anemia, and Diabetes   HPI Stephen Hall presents for f/up ---  Discussed the use of AI scribe software for clinical note transcription with the patient, who gave verbal consent to proceed.  History of Present Illness Stephen Hall is a 42 year old male with diabetes who presents with concerns about muscle mass loss and medication side effects.  He is experiencing ongoing muscle mass loss, which he attributes to Mounjaro . Despite engaging in physical activities like mowing the grass, he experiences significant fatigue and sluggishness for hours afterward.  He recently completed a course of blood pressure medications and has started taking olmesartan  and indapamide . No dizziness, lightheadedness, muscle cramps, or twitches from these medications.  He experiences dehydration after taking Mounjaro  and reports discomfort described as nausea and a feeling of fullness after eating, which he believes contributes to weight loss. He notes a weight reduction from 340-350 pounds earlier this year to 325 pounds currently.  He experiences dull headaches, particularly upon waking, but denies any sharp pain.  He takes thiamine  more regularly than vitamin D  and does not take other vitamin supplements. He was previously noted to be anemic. He has received both the flu and COVID vaccines.     Outpatient Medications Prior to Visit  Medication Sig Dispense Refill   albuterol  (VENTOLIN  HFA) 108 (90 Base) MCG/ACT inhaler Inhale 2 puffs into the lungs every 6 (six) hours as needed for wheezing or shortness of breath. 7.5 g 0   amLODipine -olmesartan  (AZOR ) 5-20 MG tablet Take 1 tablet by mouth daily. To replace amlodipine  and candesartan  90 tablet 0   Bempedoic Acid -Ezetimibe  (NEXLIZET ) 180-10 MG TABS Take 1 tablet by mouth daily. 90 tablet 1   Continuous Glucose Sensor (DEXCOM G7 SENSOR)  MISC Change sensor every 10 days 3 each 3   Dapagliflozin  Pro-metFORMIN  ER (XIGDUO  XR) 04-999 MG TB24 Take 1 tablet by mouth daily. 90 tablet 3   Glucagon  (GVOKE HYPOPEN  2-PACK) 1 MG/0.2ML SOAJ Inject 1 pen into the skin daily as needed for low blood sugar 0.4 mL 5   injection device for insulin  (CEQUR SIMPLICITY 2U) DEVI Change every 2 days , take 15 clicks before meals 3 times a day. 16 each 11   Injection Device for Insulin  (CEQUR SIMPLICITY INSERTER) MISC Use as advised 1 each 0   insulin  aspart (NOVOLOG  FLEXPEN) 100 UNIT/ML FlexPen Inject 35 Units into the skin 3 (three) times daily with meals.     insulin  aspart (NOVOLOG ) 100 UNIT/ML injection Use up to 90 units / day via Cequr device. 50 mL 3   insulin  degludec (TRESIBA  FLEXTOUCH) 200 UNIT/ML FlexTouch Pen Inject 160 Units into the skin daily. 81 mL 3   Insulin  Pen Needle 32G X 6 MM MISC Use as directed morning, noon, in the evening and at bedtime. 300 each 3   Lancets (ONETOUCH DELICA PLUS LANCET33G) MISC USE TO CHECK BLOOD GLUCOSE THREE TIMES DAILY 100 each 1   metoprolol  tartrate (LOPRESSOR ) 100 MG tablet Take tablet (100mg ) TWO hours prior to your cardiac CT scan. 1 tablet 0   rosuvastatin  (CRESTOR ) 10 MG tablet Take 1 tablet (10 mg total) by mouth daily. 90 tablet 1   sildenafil  (VIAGRA ) 100 MG tablet Take 1 tablet (100 mg total) by mouth daily as needed for erectile dysfunction 30 tablet 3   tadalafil  (CIALIS ) 5 MG tablet Take 1 tablet (5 mg  total) by mouth daily. 90 tablet 3   thiamine  (VITAMIN B-1) 50 MG tablet Take 1 tablet (50 mg total) by mouth daily. 100 tablet 1   tirzepatide  (MOUNJARO ) 10 MG/0.5ML Pen Inject 10 mg into the skin once a week. 9 mL 3   indapamide  (LOZOL ) 1.25 MG tablet Take 1 tablet (1.25 mg total) by mouth daily. 90 tablet 1   No facility-administered medications prior to visit.    ROS Review of Systems  Constitutional:  Positive for fatigue. Negative for appetite change, chills, diaphoresis and fever.   HENT: Negative.  Negative for sore throat and trouble swallowing.   Eyes: Negative.   Respiratory: Negative.  Negative for cough, chest tightness, shortness of breath and wheezing.   Cardiovascular:  Negative for chest pain, palpitations and leg swelling.  Gastrointestinal: Negative.  Negative for abdominal pain, blood in stool, constipation, diarrhea, nausea and vomiting.  Endocrine: Negative.   Genitourinary: Negative.  Negative for difficulty urinating and dysuria.  Musculoskeletal: Negative.  Negative for arthralgias and myalgias.  Skin: Negative.   Neurological: Negative.  Negative for dizziness and weakness.  Hematological:  Negative for adenopathy. Does not bruise/bleed easily.  Psychiatric/Behavioral: Negative.      Objective:  BP 124/82 (BP Location: Left Arm, Patient Position: Sitting, Cuff Size: Normal)   Pulse 94   Temp 98.8 F (37.1 C) (Oral)   Resp 16   Ht 6' 5 (1.956 m)   Wt (!) 325 lb 12.8 oz (147.8 kg)   SpO2 97%   BMI 38.63 kg/m   BP Readings from Last 3 Encounters:  04/29/24 124/82  03/30/24 118/80  02/25/24 128/80    Wt Readings from Last 3 Encounters:  04/29/24 (!) 325 lb 12.8 oz (147.8 kg)  03/30/24 (!) 329 lb 12.8 oz (149.6 kg)  02/25/24 (!) 330 lb 6.4 oz (149.9 kg)    Physical Exam Vitals reviewed.  Constitutional:      Appearance: Normal appearance.  HENT:     Nose: Nose normal.     Mouth/Throat:     Mouth: Mucous membranes are moist.  Eyes:     General: No scleral icterus.    Conjunctiva/sclera: Conjunctivae normal.  Cardiovascular:     Rate and Rhythm: Normal rate and regular rhythm.     Heart sounds: No murmur heard.    No friction rub. No gallop.  Pulmonary:     Effort: Pulmonary effort is normal.     Breath sounds: No stridor. No wheezing, rhonchi or rales.  Abdominal:     General: Abdomen is flat.     Palpations: There is no mass.     Tenderness: There is no abdominal tenderness. There is no guarding.     Hernia: No hernia  is present.  Musculoskeletal:        General: Normal range of motion.     Cervical back: Neck supple.     Right lower leg: No edema.     Left lower leg: No edema.  Lymphadenopathy:     Cervical: No cervical adenopathy.  Skin:    General: Skin is warm and dry.  Neurological:     General: No focal deficit present.     Mental Status: He is alert.  Psychiatric:        Mood and Affect: Mood normal.        Behavior: Behavior normal.     Lab Results  Component Value Date   WBC 9.8 04/29/2024   HGB 13.4 04/29/2024   HCT 41.7 04/29/2024  PLT 309.0 04/29/2024   GLUCOSE 147 (H) 04/29/2024   CHOL 136 04/29/2024   TRIG 80.0 04/29/2024   HDL 39.80 04/29/2024   LDLDIRECT 158.1 02/07/2012   LDLCALC 80 04/29/2024   ALT 18 09/23/2023   AST 15 09/23/2023   NA 137 04/29/2024   K 4.4 04/29/2024   CL 100 04/29/2024   CREATININE 0.96 04/29/2024   BUN 12 04/29/2024   CO2 27 04/29/2024   TSH 2.27 09/23/2023   PSA 0.71 02/13/2023   HGBA1C 10.2 (H) 04/29/2024   MICROALBUR <0.7 09/23/2023    CT CORONARY MORPH W/CTA COR W/SCORE W/CA W/CM &/OR WO/CM Addendum Date: 02/04/2024 ADDENDUM REPORT: 02/04/2024 20:22 EXAM: OVER-READ INTERPRETATION  CT CHEST The following report is an over-read performed by radiologist Dr. Andrea Gasman of [facility] on 02/04/2024. This over-read does not include interpretation of cardiac or coronary anatomy or pathology. The coronary CTA interpretation by the cardiologist is attached. COMPARISON:  Cardiac CT 1425 FINDINGS: Vascular: No aortic atherosclerosis. The included aorta is normal in caliber. Mediastinum/nodes: No adenopathy or mass. Unremarkable esophagus. Lungs: No focal airspace disease. Unchanged 2 mm left lower lobe nodule series 309, image 114. No pleural fluid. The included airways are patent. Upper abdomen: No acute or unexpected findings. Musculoskeletal: There are no acute or suspicious osseous abnormalities. IMPRESSION: 1. No acute extracardiac  findings. 2. Unchanged 2 mm left lower lobe pulmonary nodule. No follow-up is needed if patient is low risk. Electronically Signed   By: Andrea Gasman M.D.   On: 02/04/2024 20:22   Result Date: 02/04/2024 CLINICAL DATA:  Chest pain EXAM: Cardiac CTA MEDICATIONS: Sub lingual nitro. 4mg  and lopressor  100mg  TECHNIQUE: A non-contrast, gated CT scan was obtained with axial slices of 2.5 mm through the heart for calcium  scoring. Calcium  scoring was performed using the Agatston method. A 120 kV prospective, gated, contrast cardiac CT scan was obtained. Gantry rotation speed was 230 msec and collimation was 0.63 mm. Two sublingual nitroglycerin  tablets (0.8 mg) were given. The 3D data set was reconstructed with motion correction for the best systolic or diastolic phase. Images were analyzed on a dedicated workstation using MPR, MIP, and VRT modes. The patient received 95 cc of contrast. FINDINGS: Non-cardiac: See separate report from [facility]. No significant findings on limited lung and soft tissue windows. Calcium  Score: Calcium  isolated to the LAD Coronary Arteries: Right dominant with no anomalies LM: Normal LAD: 1-24% calcified plaque in proximal vessel D1: Normal D2: Normal D3: Normal Circumflex: Normal OM1: Normal OM2: Normal RCA: Normal PDA: Normal PLA: Normal IMPRESSION: 1.  Calcium  score 11.4 Unable to give MESA percentile due to age 52.  CAD RADS 1 non obstructive CAD see description above 3.  Normal ascending thoracic aorta 3.0 cm Maude Emmer Electronically Signed: By: Maude Emmer M.D. On: 02/04/2024 15:20    Assessment & Plan:  Insulin -requiring or dependent type II diabetes mellitus (HCC) -     Basic metabolic panel with GFR; Future -     Hemoglobin A1c; Future  Thiamine  deficiency neuropathy -     CBC with Differential/Platelet; Future  Essential hypertension- BP is over-controlled. Will discontinue the thiazide diuretic. 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 -     Vitamin B12; Future -     Folate; Future -     IBC + Ferritin; Future -     Reticulocytes; Future  Hyperlipidemia with target LDL less than 130 -     CK; Future -     Lipid panel; Future  Iron deficiency- Will start oral iron and will screen for GI blood loss. -     Hemoccult Cards (X3 cards); Future -     ACCRUFeR ; Take 1 capsule (30 mg total) by mouth in the morning and at bedtime.  Dispense: 180 capsule; Refill: 0     Follow-up: Return in about 4 months (around 08/29/2024).  Debby Molt, MD "

## 2024-04-29 NOTE — Telephone Encounter (Signed)
 Pharmacy Patient Advocate Encounter   Received notification from Patient Pharmacy that prior authorization for Accrufer 30 mg capsules is required/requested.   Insurance verification completed.   The patient is insured through [facility].   Per test claim: Per test claim, medication is not covered due to plan/benefit exclusion, PA not submitted at this time    The price at [facility] is over $600, he can use  good rx at a different pharmacy [facility] doesn't accept good rx) for around $200. See below for prices.

## 2024-04-30 ENCOUNTER — Other Ambulatory Visit (HOSPITAL_COMMUNITY): Payer: Self-pay

## 2024-04-30 ENCOUNTER — Other Ambulatory Visit: Payer: Self-pay

## 2024-04-30 NOTE — Telephone Encounter (Signed)
 Patient has been made aware via voice message. I left a very detailed message about this. If Patient calls back please reach out to me so I can discuss this with him.

## 2024-04-30 NOTE — Telephone Encounter (Unsigned)
 Copied from CRM #[id number]. Topic: General - Other >> Apr 30, 2024  2:07 PM Burnard DEL wrote: Reason for CRM: Patient returned call to [facility]. Jaz was unavailable.Patient would like a return call when CMA gets a chance.

## 2024-05-01 ENCOUNTER — Other Ambulatory Visit (HOSPITAL_COMMUNITY): Payer: Self-pay

## 2024-05-01 MED ORDER — ROSUVASTATIN CALCIUM 10 MG PO TABS
10.0000 mg | ORAL_TABLET | Freq: Every day | ORAL | 1 refills | Status: AC
  Filled 2024-05-01 – 2024-05-15 (×2): qty 90, 90d supply, fill #0
  Filled 2024-05-26: qty 30, 30d supply, fill #0

## 2024-05-01 NOTE — Telephone Encounter (Signed)
 Patient wants to know can he take a OTC iron supplement and what dosage he needs to take ?

## 2024-05-02 NOTE — Telephone Encounter (Signed)
 Yes, you can ask the pharmacist for a recommendation Please do the stool cards

## 2024-05-05 ENCOUNTER — Telehealth: Payer: Self-pay

## 2024-05-05 ENCOUNTER — Other Ambulatory Visit

## 2024-05-05 ENCOUNTER — Other Ambulatory Visit: Admitting: Pharmacist

## 2024-05-05 DIAGNOSIS — E119 Type 2 diabetes mellitus without complications: Secondary | ICD-10-CM

## 2024-05-05 DIAGNOSIS — I10 Essential (primary) hypertension: Secondary | ICD-10-CM

## 2024-05-05 NOTE — Progress Notes (Signed)
 05/05/2024 Name: Stephen Hall MRN: [id number] DOB: August 17, 1980  Chief Complaint  Patient presents with   Diabetes   Hypertension   Medication Management    Stephen Hall is a 43 y.o. year old male who presented for a in office visit.   They were referred to the pharmacist by their PCP for assistance in managing diabetes and hypertension.    Subjective:  Care Team: Primary Care Provider: Joshua Debby CROME, MD ; Next Scheduled Visit: 04/29/24 Endocrinologist Dr. Mercie; Next Scheduled Visit: 04/17/24  Medication Access/Adherence  Current Pharmacy:  Stephen Hall - [facility] [address] [place] [address] Phone: [phone] Fax: [phone]   Patient reports affordability concerns with their medications: Yes  Patient reports access/transportation concerns to their pharmacy: No  Patient reports adherence concerns with their medications:  No    Diabetes: *Followed by endo - Dr. Mercie Current medications: Xigduo  XR 04/999 mg daily, Mounjaro  10 mg weekly (Thursdays), Tresiba  160 units per day in AM, Novolog  30 units TID AC using CeQur insulin  patch - Has been better recently about taking Xigduo  daily  - Patient notes that he is being more adherent to his oral medications. He said that he only missed two days in his 14 day pill box. He has, however, been less adherent with his insulin  injections lately.    Medications tried in the past: Mounjaro  12.5 mg caused GI upset  Current glucose readings: Using Dexcom G7      Current meal patterns:  Pt describes his diet as terrible because he is always eating on the go. Has tried to cut back on carbs and eat smaller portion but notes the overall food choices are not ideal.   Eats 3-4 meals/day Breakfast - leftovers; meat, juice or soda Lunch - something similar to breakfast; adds tomato, lettuce, or something green; sweet tea Afternoon snack - whatever he can find Dinner - heartiest  meal  Current physical activity: has been swimming and exercising  Current medication access support: copay cards for Xigduo , Nexlizet , and Mounjaro   Hypertension: Current medications: amlodipine -olmesartan  5-20 mg daily, indapamide  1.25 mg daily  Patient does have a validated, automated, upper arm home BP cuff. - Has not checked BP since receiving new blood pressure cuff: 119/78 - Denies dizziness.  Of note, patient is going through the process to get approval for weight loss surgery    Objective:  BP Readings from Last 3 Encounters:  04/29/24 124/82  03/30/24 118/80  02/25/24 128/80     Lab Results  Component Value Date   HGBA1C 10.2 (Hall) 04/29/2024    Lab Results  Component Value Date   CREATININE 0.96 04/29/2024   BUN 12 04/29/2024   NA 137 04/29/2024   K 4.4 04/29/2024   CL 100 04/29/2024   CO2 27 04/29/2024    Lab Results  Component Value Date   CHOL 136 04/29/2024   HDL 39.80 04/29/2024   LDLCALC 80 04/29/2024   LDLDIRECT 158.1 02/07/2012   TRIG 80.0 04/29/2024   CHOLHDL 3 04/29/2024    Medications Reviewed Today     Reviewed by Stephen Hall, RPH (Pharmacist) on 05/05/24 at 1630  Med List Status: <None>   Medication Order Taking? Sig Documenting Provider Last Dose Status Informant  albuterol  (VENTOLIN  HFA) 108 (90 Base) MCG/ACT inhaler [id number]  Inhale 2 puffs into the lungs every 6 (six) hours as needed for wheezing or shortness of breath. Stephen Domino, FNP  Active   amLODipine -olmesartan  (AZOR ) 5-20 MG tablet  [id number] Yes Take 1 tablet by mouth daily. To replace amlodipine  and candesartan  Stephen Debby CROME, MD  Active   Bempedoic Acid -Ezetimibe  (NEXLIZET ) 180-10 MG TABS [id number]  Take 1 tablet by mouth daily. Stephen Debby CROME, MD  Active   Continuous Glucose Sensor (DEXCOM G7 SENSOR) MISC [id number]  Change sensor every 10 days Stephen Hall, [place], MD  Active   Dapagliflozin  Pro-metFORMIN  ER (XIGDUO  XR) 04-999 MG TB24 [id number]  Take 1 tablet by  mouth daily. Stephen Hall, [place], MD  Active   Ferric Maltol (ACCRUFER) 30 MG CAPS [id number]  Take 1 capsule (30 mg total) by mouth in the morning and at bedtime. Stephen Debby CROME, MD  Active   Glucagon  (GVOKE HYPOPEN  2-PACK) 1 MG/0.2ML Stephen Hall [id number]  Inject 1 pen into the skin daily as needed for low blood sugar Stephen Debby CROME, MD  Active   injection device for insulin  (CEQUR SIMPLICITY 2U) DEVI [id number]  Change every 2 days , take 15 clicks before meals 3 times a day. Stephen Hall, [place], MD  Active   Injection Device for Insulin  [facility] SIMPLICITY INSERTER) MISC [id number]  Use as advised Stephen Hall, [place], MD  Active     Discontinued 05/05/24 1630 (Change in therapy)   insulin  aspart (NOVOLOG ) 100 UNIT/ML injection [id number] Yes Use up to 90 units / day via Cequr device. Stephen Hall, [place], MD  Active   insulin  degludec (TRESIBA  FLEXTOUCH) 200 UNIT/ML FlexTouch Pen [id number] Yes Inject 160 Units into the skin daily. Stephen Hall, [place], MD  Active   Insulin  Pen Needle 32G X 6 MM MISC [id number]  Use as directed morning, noon, in the evening and at bedtime. Stephen Hall, [place], MD  Active   Lancets [facility] DELICA PLUS [place]) MISC [id number]  USE TO CHECK BLOOD GLUCOSE THREE TIMES DAILY Stephen Mallick, MD  Active   rosuvastatin  (CRESTOR ) 10 MG tablet [id number]  Take 1 tablet (10 mg total) by mouth daily. Stephen Debby CROME, MD  Active   sildenafil  (VIAGRA ) 100 MG tablet [id number]  Take 1 tablet (100 mg total) by mouth daily as needed for erectile dysfunction   Active   tadalafil  (CIALIS ) 5 MG tablet [id number]  Take 1 tablet (5 mg total) by mouth daily.   Active   thiamine  (VITAMIN B-1) 50 MG tablet [id number]  Take 1 tablet (50 mg total) by mouth daily. Stephen Debby CROME, MD  Active   tirzepatide  (MOUNJARO ) 10 MG/0.5ML Pen [id number] Yes Inject 10 mg into the skin once a week. Stephen Hall, [place], MD  Active            Med Note Stephen Hall   Thu Nov 14, 2023  9:41 AM) Thursdays            Assessment/Plan:   Diabetes: - Currently  uncontrolled, A1c goal <7% - Average on Dexcom has remained uncontrolled however BG for the last 5 days has been improved with 7 day average of 163 - Reviewed goal A1c, goal fasting, and goal 2 hour post prandial glucose - Reviewed dietary modifications including eliminating high sugar beverages such as sodas and juices, encouraged increased protein/fiber and balanced meals/snacks - Reviewed lifestyle modifications including: increased movement/walking, strength training as able - Reviewed proper use of insulin . Novolog  to be taken 10-15 minutes prior to eating, which may help prevent high BG spikes after eating and low BGs later on. - Recommend to continue Novolog  to 30 units TID before meals. Continue Tresiba , Mounjaro , Xigduo  - Recommend to continue focus on timing of Novolog  doses and adherence with Novolog  and  Xigduo . - Encouraged to continue using CeQur insulin  patch for improved Novolog  adherence  Med access: Xigduo  copay card at [facility] [id number] PCN: CN, GRP: [id number], ID: [id number]  Nexlizet  - already re-enrolled pt in copay card program, using same copay card as instructed  Appears Mounjaro  already has a copay card and his insulins are $0  Hypertension: - Currently controlled, BP goal <130/80. BP improved.  - Reviewed Hall term cardiovascular and renal outcomes of uncontrolled blood pressure - Recommended to continue amlodipine -olmesartan  5/20 mg daily and indapamide     Follow Up Plan: 11/18   [name], PharmD, BCPS, CPP Clinical Pharmacist Practitioner [facility] [phone]

## 2024-05-05 NOTE — Telephone Encounter (Signed)
 Copied from CRM #[id number]. Topic: General - Other >> May 05, 2024 12:12 PM Alexandria E wrote: Reason for CRM: Patient missed the pharmacist outreach appointment today, 11/4 at 10:30. Patient stated Bari tried reaching out to him. Please call patient to get rescheduled, thank you.

## 2024-05-05 NOTE — Telephone Encounter (Signed)
 Call was returned.  [name], PharmD, BCPS, CPP Clinical Pharmacist Practitioner [place] [facility] [facility] [phone]

## 2024-05-07 NOTE — Telephone Encounter (Signed)
**Note De-identified  [place] Obfuscation** Please advise 

## 2024-05-08 NOTE — Patient Instructions (Signed)
 It was a pleasure speaking with you today!  Continue current regimen.  Feel free to call with any questions or concerns!  [name], PharmD, BCPS, CPP Clinical Pharmacist Practitioner [place] [facility] at [facility] [phone]

## 2024-05-08 NOTE — Telephone Encounter (Signed)
 Patient has been made aware. Coming in today to pick up his stool cards.

## 2024-05-16 ENCOUNTER — Other Ambulatory Visit (HOSPITAL_COMMUNITY): Payer: Self-pay

## 2024-05-19 ENCOUNTER — Other Ambulatory Visit: Admitting: Pharmacist

## 2024-05-19 DIAGNOSIS — I10 Essential (primary) hypertension: Secondary | ICD-10-CM

## 2024-05-19 NOTE — Progress Notes (Signed)
 05/19/2024 Name: Stephen Hall MRN: [id number] DOB: [birth year]  Chief Complaint  Patient presents with   Diabetes   Medication Management   Hypertension    Stephen Hall is a 43 y.o. year old male who presented for a in office visit.   They were referred to the pharmacist by their PCP for assistance in managing diabetes and hypertension.    Subjective:  Care Team: Primary Care Provider: Joshua Debby CROME, Hall ; Next Scheduled Visit: 04/29/24 Endocrinologist Stephen Hall; Next Scheduled Visit: 04/17/24  Medication Access/Adherence  Current Pharmacy:  DARRYLE LONG - [facility] [address] [place] [address] Phone: [phone] Fax: [phone]   Patient reports affordability concerns with their medications: Yes  Patient reports access/transportation concerns to their pharmacy: No  Patient reports adherence concerns with their medications:  No    Diabetes: *Followed by endo - Stephen Hall Current medications: Xigduo  XR 04/999 mg daily, Mounjaro  10 mg weekly (Thursdays), Tresiba  160 units per day in AM, Novolog  30 units TID AC using CeQur insulin  patch  Has not been using CeQur insulin  patch because he wants to use up his remaining insulin  pen needles.  Medications tried in the past: Mounjaro  12.5 mg caused GI upset  Current glucose readings: Using Dexcom G7      Current meal patterns:  Pt describes his diet as terrible because he is always eating on the go. Has tried to cut back on carbs and eat smaller portion but notes the overall food choices are not ideal.   Eats 3-4 meals/day Breakfast - leftovers; meat, juice or soda Lunch - something similar to breakfast; adds tomato, lettuce, or something green; sweet tea Afternoon snack - whatever he can find Dinner - heartiest meal  Current physical activity: has been swimming and exercising  Current medication access support: copay cards for Xigduo , Nexlizet , and  Mounjaro   Hypertension: Current medications: amlodipine -olmesartan  5-20 mg daily PCP recently discontinued indapamide  due to BP overcontrolled (BP was 124/82 at OV)  Patient does have a validated, automated, upper arm home BP cuff. - No recent BP readings. Has not checked since stopping indapamide   Denies s/sx hypotension  Of note, patient is going through the process to get approval for weight loss surgery    Objective:  BP Readings from Last 3 Encounters:  04/29/24 124/82  03/30/24 118/80  02/25/24 128/80     Lab Results  Component Value Date   HGBA1C 10.2 (Hall) 04/29/2024    Lab Results  Component Value Date   CREATININE 0.96 04/29/2024   BUN 12 04/29/2024   NA 137 04/29/2024   K 4.4 04/29/2024   CL 100 04/29/2024   CO2 27 04/29/2024    Lab Results  Component Value Date   CHOL 136 04/29/2024   HDL 39.80 04/29/2024   LDLCALC 80 04/29/2024   LDLDIRECT 158.1 02/07/2012   TRIG 80.0 04/29/2024   CHOLHDL 3 04/29/2024    Medications Reviewed Today     Reviewed by Stephen Hall, RPH (Pharmacist) on 05/19/24 at 1642  Med List Status: <None>   Medication Order Taking? Sig Documenting Provider Last Dose Status Informant  albuterol  (VENTOLIN  HFA) 108 (90 Base) MCG/ACT inhaler [id number]  Inhale 2 puffs into the lungs every 6 (six) hours as needed for wheezing or shortness of breath. Stephen Domino, FNP  Active   amLODipine -olmesartan  (AZOR ) 5-20 MG tablet [id number]  Take 1 tablet by mouth daily. To replace amlodipine  and candesartan  Stephen Debby CROME, Hall  Active   Bempedoic  Acid-Ezetimibe  (NEXLIZET ) 180-10 MG TABS [id number]  Take 1 tablet by mouth daily. Stephen Debby CROME, Hall  Active   Continuous Glucose Sensor (DEXCOM G7 SENSOR) MISC [id number]  Change sensor every 10 days Stephen Hall  Active   Dapagliflozin  Pro-metFORMIN  ER (XIGDUO  XR) 04-999 MG TB24 [id number]  Take 1 tablet by mouth daily. Stephen Hall  Active   Ferric Maltol (ACCRUFER) 30 MG CAPS [id number]   Take 1 capsule (30 mg total) by mouth in the morning and at bedtime. Stephen Debby CROME, Hall  Active   Glucagon  (GVOKE HYPOPEN  2-PACK) 1 MG/0.2ML Stephen Hall [id number]  Inject 1 pen into the skin daily as needed for low blood sugar Stephen Debby CROME, Hall  Active   injection device for insulin  (CEQUR SIMPLICITY 2U) DEVI [id number]  Change every 2 days , take 15 clicks before meals 3 times a day. Stephen Hall  Active   Injection Device for Insulin  [facility] SIMPLICITY INSERTER) MISC [id number]  Use as advised Stephen Hall  Active   insulin  aspart (NOVOLOG ) 100 UNIT/ML injection [id number]  Use up to 90 units / day via Cequr device. Stephen Hall  Active   insulin  degludec (TRESIBA  FLEXTOUCH) 200 UNIT/ML FlexTouch Pen [id number]  Inject 160 Units into the skin daily. Stephen Hall  Active   Insulin  Pen Needle 32G X 6 MM MISC [id number]  Use as directed morning, noon, in the evening and at bedtime. Stephen Hall  Active   Lancets [facility] DELICA PLUS [place]) MISC [id number]  USE TO CHECK BLOOD GLUCOSE THREE TIMES DAILY Stephen Mallick, Hall  Active   rosuvastatin  (CRESTOR ) 10 MG tablet [id number]  Take 1 tablet (10 mg total) by mouth daily. Stephen Debby CROME, Hall  Active   sildenafil  (VIAGRA ) 100 MG tablet [id number]  Take 1 tablet (100 mg total) by mouth daily as needed for erectile dysfunction   Active   tadalafil  (CIALIS ) 5 MG tablet [id number]  Take 1 tablet (5 mg total) by mouth daily.   Active   thiamine  (VITAMIN B-1) 50 MG tablet [id number]  Take 1 tablet (50 mg total) by mouth daily. Stephen Debby CROME, Hall  Active   tirzepatide  (MOUNJARO ) 10 MG/0.5ML Pen [id number]  Inject 10 mg into the skin once a week. Stephen Hall  Active            Med Note Stephen Hall   Thu Nov 14, 2023  9:41 AM) Thursdays            Assessment/Plan:   Diabetes: - Currently uncontrolled, A1c goal <7% - Average on Dexcom has improved since last appt but remains uncontrolled. GMI last 2 weeks 8.4%. Main issue is medication  adherence and lifestyle. - Reviewed goal A1c, goal fasting, and goal 2 hour post prandial glucose - Reviewed dietary modifications including eliminating high sugar beverages such as sodas and juices, encouraged increased protein/fiber and balanced meals/snacks - Reviewed lifestyle modifications including: increased movement/walking, strength training as able - Reviewed proper use of insulin . Novolog  to be taken 10-15 minutes prior to eating, which may help prevent high BG spikes after eating and low BGs later on. - Recommend to continue Novolog  to 30 units TID before meals. Continue Tresiba , Mounjaro , Xigduo  - Recommend to continue focus on timing of Novolog  doses and adherence with Novolog  and Xigduo . - Encouraged to continue using CeQur insulin  patch for improved Novolog  adherence  Med access: Xigduo  copay card at [facility] [id number] PCN: CN, GRP: [id number], ID: [id number]  Nexlizet  -  already re-enrolled pt in copay card program, using same copay card as instructed  Appears Mounjaro  already has a copay card and his insulins are $0  Hypertension: - Currently borderline controlled, BP goal <130/80. Diastolic was above goal at OV.  - Reviewed long term cardiovascular and renal outcomes of uncontrolled blood pressure - Recommended to continue amlodipine -olmesartan  5/20 mg daily and check BP at home to ensure BP is remaining below goal <130/80 and has not increased too much since discontinuing indapamide     Follow Up Plan: 12/8   [name], PharmD, BCPS, CPP Clinical Pharmacist Practitioner [facility] at [facility] [phone]

## 2024-05-26 ENCOUNTER — Other Ambulatory Visit (HOSPITAL_COMMUNITY): Payer: Self-pay

## 2024-06-01 ENCOUNTER — Other Ambulatory Visit: Payer: Self-pay

## 2024-06-01 ENCOUNTER — Other Ambulatory Visit (HOSPITAL_COMMUNITY): Payer: Self-pay

## 2024-06-03 ENCOUNTER — Other Ambulatory Visit: Payer: Self-pay | Admitting: Endocrinology

## 2024-06-03 ENCOUNTER — Other Ambulatory Visit (HOSPITAL_COMMUNITY): Payer: Self-pay

## 2024-06-03 DIAGNOSIS — E1165 Type 2 diabetes mellitus with hyperglycemia: Secondary | ICD-10-CM

## 2024-06-03 MED ORDER — DEXCOM G7 SENSOR MISC
1.0000 | 3 refills | Status: DC
  Filled 2024-06-03: qty 3, 30d supply, fill #0

## 2024-06-08 ENCOUNTER — Other Ambulatory Visit (HOSPITAL_COMMUNITY): Payer: Self-pay

## 2024-06-08 ENCOUNTER — Other Ambulatory Visit: Admitting: Pharmacist

## 2024-06-08 VITALS — BP 138/85

## 2024-06-08 DIAGNOSIS — I10 Essential (primary) hypertension: Secondary | ICD-10-CM

## 2024-06-08 MED ORDER — INDAPAMIDE 1.25 MG PO TABS
1.2500 mg | ORAL_TABLET | Freq: Every day | ORAL | 0 refills | Status: AC
  Filled 2024-06-08: qty 90, 90d supply, fill #0

## 2024-06-08 NOTE — Patient Instructions (Signed)
 It was a pleasure speaking with you today!  Continue current diabetes regimen. Restart indapamide .  Feel free to call with any questions or concerns!  [name], PharmD, BCPS, CPP Clinical Pharmacist Practitioner [place] [facility] at [facility] [phone]

## 2024-06-08 NOTE — Progress Notes (Signed)
 06/08/2024 Name: Stephen Hall MRN: [id number] DOB: Jul 24, 1980  Chief Complaint  Patient presents with   Diabetes   Hypertension   Medication Management    Stephen Hall is a 43 y.o. year old male who presented for a in office visit.   They were referred to the pharmacist by their PCP for assistance in managing diabetes and hypertension.    Subjective:  Care Team: Primary Care Provider: Joshua Debby CROME, MD ; Next Scheduled Visit: 08/13/23 Endocrinologist Dr. Mercie; Next Scheduled Visit: 07/01/24  Medication Access/Adherence  Current Pharmacy:  DARRYLE LONG - [facility] [address] [place] [address] Phone: [phone] Fax: [phone]   Patient reports affordability concerns with their medications: Yes  Patient reports access/transportation concerns to their pharmacy: No  Patient reports adherence concerns with their medications:  No    Diabetes: *Followed by endo - Dr. Mercie Current medications: Xigduo  XR 04/999 mg daily, Mounjaro  10 mg weekly (Thursdays), Tresiba  160 units per day in AM, Novolog  30 units TID AC using CeQur insulin  patch  Has not been using CeQur insulin  patch because he wants to use up his remaining insulin  pen needles.  Medications tried in the past: Mounjaro  12.5 mg caused GI upset  Current glucose readings: Using Dexcom G7      Current meal patterns:  Pt describes his diet as terrible because he is always eating on the go. Has tried to cut back on carbs and eat smaller portion but notes the overall food choices are not ideal.   Eats 3-4 meals/day Breakfast - leftovers; meat, juice or soda Lunch - something similar to breakfast; adds tomato, lettuce, or something green; sweet tea Afternoon snack - whatever he can find Dinner - heartiest meal  Current physical activity: has been swimming and exercising  Current medication access support: copay cards for Xigduo , Nexlizet , and  Mounjaro   Hypertension: Current medications: amlodipine -olmesartan  5-20 mg daily PCP recently discontinued indapamide  due to BP overcontrolled (BP was 124/82 at OV)  Patient does have a validated, automated, upper arm home BP cuff. - Recent Home BP readings. 138/85-90  Denies s/sx hypotension  Of note, patient is going through the process to get approval for weight loss surgery  Objective:  BP Readings from Last 3 Encounters:  06/08/24 138/85  04/29/24 124/82  03/30/24 118/80     Lab Results  Component Value Date   HGBA1C 10.2 (H) 04/29/2024    Lab Results  Component Value Date   CREATININE 0.96 04/29/2024   BUN 12 04/29/2024   NA 137 04/29/2024   K 4.4 04/29/2024   CL 100 04/29/2024   CO2 27 04/29/2024    Lab Results  Component Value Date   CHOL 136 04/29/2024   HDL 39.80 04/29/2024   LDLCALC 80 04/29/2024   LDLDIRECT 158.1 02/07/2012   TRIG 80.0 04/29/2024   CHOLHDL 3 04/29/2024    Medications Reviewed Today     Reviewed by Merceda Lela SAUNDERS, RPH (Pharmacist) on 06/08/24 at 1055  Med List Status: <None>   Medication Order Taking? Sig Documenting Provider Last Dose Status Informant  albuterol  (VENTOLIN  HFA) 108 (90 Base) MCG/ACT inhaler [id number]  Inhale 2 puffs into the lungs every 6 (six) hours as needed for wheezing or shortness of breath. Kennyth Domino, FNP  Active   amLODipine -olmesartan  (AZOR ) 5-20 MG tablet [id number] Yes Take 1 tablet by mouth daily. To replace amlodipine  and candesartan  Joshua Debby CROME, MD  Active   Bempedoic Acid -Ezetimibe  (NEXLIZET ) 180-10 MG TABS [id number]  Take 1 tablet by mouth daily. Joshua Debby CROME, MD  Active   Continuous Glucose Sensor (DEXCOM G7 SENSOR) MISC [id number]  Apply 1 sensor to back of upper arm every 10 days. Thapa, [place], MD  Active   Dapagliflozin  Pro-metFORMIN  ER (XIGDUO  XR) 04-999 MG TB24 [id number] Yes Take 1 tablet by mouth daily. Thapa, [place], MD  Active   Ferric Maltol  (ACCRUFER ) 30 MG CAPS [id number]  Take  1 capsule (30 mg total) by mouth in the morning and at bedtime. Joshua Debby CROME, MD  Active   Glucagon  (GVOKE HYPOPEN  2-PACK) 1 MG/0.2ML EMMANUEL [id number]  Inject 1 pen into the skin daily as needed for low blood sugar Joshua Debby CROME, MD  Active   injection device for insulin  (CEQUR SIMPLICITY 2U) DEVI [id number]  Change every 2 days , take 15 clicks before meals 3 times a day. Thapa, [place], MD  Active   Injection Device for Insulin  (CEQUR SIMPLICITY INSERTER) MISC [id number]  Use as advised Thapa, [place], MD  Active   insulin  aspart (NOVOLOG ) 100 UNIT/ML injection [id number] Yes Use up to 90 units / day via Cequr device. Thapa, [place], MD  Active   insulin  degludec (TRESIBA  FLEXTOUCH) 200 UNIT/ML FlexTouch Pen [id number] Yes Inject 160 Units into the skin daily. Thapa, [place], MD  Active   Insulin  Pen Needle 32G X 6 MM MISC [id number]  Use as directed morning, noon, in the evening and at bedtime. Thapa, [place], MD  Active   Lancets [facility] DELICA PLUS [place]) MISC [id number]  USE TO CHECK BLOOD GLUCOSE THREE TIMES DAILY Kassie Mallick, MD  Active   rosuvastatin  (CRESTOR ) 10 MG tablet [id number]  Take 1 tablet (10 mg total) by mouth daily. Joshua Debby CROME, MD  Active   sildenafil  (VIAGRA ) 100 MG tablet [id number]  Take 1 tablet (100 mg total) by mouth daily as needed for erectile dysfunction   Active   tadalafil  (CIALIS ) 5 MG tablet [id number]  Take 1 tablet (5 mg total) by mouth daily.   Active   thiamine  (VITAMIN B-1) 50 MG tablet [id number]  Take 1 tablet (50 mg total) by mouth daily. Joshua Debby CROME, MD  Active   tirzepatide  (MOUNJARO ) 10 MG/0.5ML Pen [id number] Yes Inject 10 mg into the skin once a week. Thapa, [place], MD  Active            Med Note ELI HARLENE VEAR [place] Nov 14, 2023  9:41 AM) Thursdays            Assessment/Plan:   Diabetes: - Currently uncontrolled, A1c goal <7% - Average on Dexcom has worsened since last appt due to Thanksgiving holiday, daily BG readings show they have been  improved since Friday 12/5. GMI last 2 weeks 9.1%. Main issue is medication adherence and lifestyle. - Reviewed goal A1c, goal fasting, and goal 2 hour post prandial glucose - Reviewed dietary modifications including eliminating high sugar beverages such as sodas and juices, encouraged increased protein/fiber and balanced meals/snacks - Reviewed lifestyle modifications including: increased movement/walking, strength training as able - Reviewed proper use of insulin . Novolog  to be taken 10-15 minutes prior to eating, which may help prevent high BG spikes after eating and low BGs later on. - Recommend to continue Novolog  to 30 units TID before meals. Continue Tresiba , Mounjaro , Xigduo  - Recommend to continue focus on timing of Novolog  doses and adherence with Novolog  and Xigduo . - Encouraged to continue using CeQur insulin  patch for improved Novolog  adherence  Med access: Xigduo  copay card at  [facility] [id number] PCN: CN, GRP: [id number], ID: [id number]  Nexlizet  - already re-enrolled pt in copay card program, using same copay card as instructed  Appears Mounjaro  already has a copay card and his insulins are $0  Hypertension: - Currently borderline controlled, BP goal <130/80. Diastolic was above goal at OV. Home BP readings are above goal without indapamide .  - Reviewed long term cardiovascular and renal outcomes of uncontrolled blood pressure - Recommended to continue amlodipine -olmesartan  5/20 mg daily and restart indapamide  1.25 mg daily to target goal <130/80   Follow Up Plan: 1/12   [name], PharmD, BCPS, CPP Clinical Pharmacist Practitioner [facility] [phone]

## 2024-06-10 ENCOUNTER — Telehealth: Admitting: Emergency Medicine

## 2024-06-10 ENCOUNTER — Ambulatory Visit: Payer: Self-pay

## 2024-06-10 DIAGNOSIS — J988 Other specified respiratory disorders: Secondary | ICD-10-CM

## 2024-06-10 DIAGNOSIS — J029 Acute pharyngitis, unspecified: Secondary | ICD-10-CM | POA: Insufficient documentation

## 2024-06-10 NOTE — Assessment & Plan Note (Signed)
 Recent onset.  Started less than 48 hours ago Needs to be examined and tested for COVID and possible strep Advised to make appointment to be seen in the office this week Symptom management discussed

## 2024-06-10 NOTE — Assessment & Plan Note (Signed)
 Differential diagnosis discussed Needs office visit so he can be properly examined and tested Advised to contact the office to make an appointment this week

## 2024-06-10 NOTE — Telephone Encounter (Signed)
 FYI Only or Action Required?: Action required by provider: update on patient condition and request for Z-Pack. VV today with Sagardia  Patient was last seen in primary care on 06/10/2024 by Purcell Emil Schanz, MD.  Called Nurse Triage reporting Sore Throat.  Symptoms began yesterday.  Interventions attempted: Rest, hydration, or home remedies.  Symptoms are: gradually worsening.  Triage Disposition: No disposition on file.  Patient/caregiver understands and will follow disposition?:   Copied from CRM #[id number]. Topic: Appointments - Appointment Scheduling >> Jun 10, 2024  3:58 PM Emylou G wrote: Wife called.. said would like an appt asap versus Friday.. looking for a zpac - he has a sore throat and very tired. Answer Assessment - Initial Assessment Questions 1. REASON FOR CALL: What is the main reason for your call? or How can I best help you?     Patient with Virtual Appt today with Dr Purcell-  Wife states patient is a brittle diabetic, extensive co-morbidities and when he gets sick like this they need to jump on top of treatment prior to it progressing as he takes twice as long to heal. Virtual appt made so he wouldn't get exposed to germs, and expose the office. Was advised to be seen in person to test for Viral vs Bacterial. Patient leaving for trip to the [facility] and PCP has sent in ABX in the past so that he will not worsen.   Wife upset as it was requested to come in person but no effort was made by provider and no CMA called after appt to get them set up. Next available Friday and Dr Joshua and Dr Purcell do not work Fridays. CAL contacted and no work in availability in the morning.   Wife adamant about getting Z-Pack started today. Pharmacy confirmed. Will go to [facility] if she has to but then there is no point in having a PCP.  Protocols used: Information Only Call - No Triage-A-AH

## 2024-06-10 NOTE — Progress Notes (Signed)
 Telemedicine Encounter- SOAP NOTE Established Patient MyChart video encounter Patient: Home  Provider: Office   Patient present only  This video encounter was conducted with the patient's (or proxy's) verbal consent via video telecommunications: yes/no: Yes Patient was instructed to have this encounter in a suitably private space; and to only have persons present to whom they give permission to participate. In addition, patient identity was confirmed by use of name plus two identifiers (DOB and address).  Chief Complaint  Patient presents with   Sore Throat    Patient states that his sore throat started yesterday and he does have post nasal drip, no coughing and he has been around people who are sick at his job but has not tested himself for covid or flu    Subjective  Stephen Hall is a 43 y.o. established patient.  Visit today complaining of sore throat and flulike symptoms that started yesterday. Will be traveling next week.  No other associated symptoms. No other complaints or medical concerns today. Sore Throat  Associated symptoms include congestion. Pertinent negatives include no abdominal pain, coughing, headaches, shortness of breath or vomiting.   ? 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   Encounter for general adult medical examination with abnormal findings 01/11/2022   Sleep paralysis, recurrent isolated 03/13/2021   Morbid obesity with BMI of 40.0-44.9, adult (HCC) 03/13/2021   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   Vitamin D  deficiency disease 12/18/2017   Excessive daytime  sleepiness 01/01/2017   Essential hypertension 11/20/2016   Hyperlipidemia with target LDL less than 130 07/12/2014   Middle insomnia 07/09/2014   Left lumbar radiculitis 02/19/2013   Morbid obesity (HCC) 02/19/2013   OSA (obstructive sleep apnea) 02/07/2012   Past Medical History:  Diagnosis Date   Asthma    Diabetes mellitus without complication (HCC)    Environmental allergies    Hyperlipidemia    Hypertension    Current Outpatient Medications  Medication Sig Dispense Refill   albuterol  (VENTOLIN  HFA) 108 (90 Base) MCG/ACT inhaler Inhale 2 puffs into the lungs every 6 (six) hours as needed for wheezing or shortness of breath. 7.5 g 0   amLODipine -olmesartan  (AZOR ) 5-20 MG tablet Take 1 tablet by mouth daily. To replace amlodipine  and candesartan  90 tablet 0   Bempedoic Acid -Ezetimibe  (NEXLIZET ) 180-10 MG TABS Take 1 tablet by mouth daily. 90 tablet 1   Continuous Glucose Sensor (DEXCOM G7 SENSOR) MISC Apply 1 sensor to back of upper arm every 10 days. 3 each 3   Dapagliflozin  Pro-metFORMIN  ER (XIGDUO  XR) 04-999 MG TB24 Take 1 tablet by mouth daily. 90 tablet 3   Ferric Maltol  (ACCRUFER ) 30 MG CAPS Take 1 capsule (30 mg total) by mouth in the morning and at bedtime. 180 capsule 0   Glucagon  (GVOKE HYPOPEN  2-PACK) 1 MG/0.2ML SOAJ Inject 1 pen into the skin daily as needed for low blood sugar 0.4 mL 5   indapamide  (LOZOL ) 1.25 MG tablet Take 1 tablet (1.25 mg total) by mouth daily. 90 tablet 0   injection device for insulin  (CEQUR SIMPLICITY 2U) DEVI Change every 2 days , take 15 clicks before meals 3 times a day. 16  each 11   Injection Device for Insulin  (CEQUR SIMPLICITY INSERTER) MISC Use as advised 1 each 0   insulin  aspart (NOVOLOG ) 100 UNIT/ML injection Use up to 90 units / day via Cequr device. 50 mL 3   insulin  degludec (TRESIBA  FLEXTOUCH) 200 UNIT/ML FlexTouch Pen Inject 160 Units into the skin daily. 81 mL 3   Insulin  Pen Needle 32G X 6 MM MISC Use as directed morning, noon,  in the evening and at bedtime. 300 each 3   Lancets (ONETOUCH DELICA PLUS LANCET33G) MISC USE TO CHECK BLOOD GLUCOSE THREE TIMES DAILY 100 each 1   rosuvastatin  (CRESTOR ) 10 MG tablet Take 1 tablet (10 mg total) by mouth daily. 90 tablet 1   sildenafil  (VIAGRA ) 100 MG tablet Take 1 tablet (100 mg total) by mouth daily as needed for erectile dysfunction 30 tablet 3   tadalafil  (CIALIS ) 5 MG tablet Take 1 tablet (5 mg total) by mouth daily. 90 tablet 3   thiamine  (VITAMIN B-1) 50 MG tablet Take 1 tablet (50 mg total) by mouth daily. 100 tablet 1   tirzepatide  (MOUNJARO ) 10 MG/0.5ML Pen Inject 10 mg into the skin once a week. 9 mL 3   No current facility-administered medications for this visit.   No Known Allergies Social History   Socioeconomic History   Marital status: Married    Spouse name: Not on file   Number of children: Not on file   Years of education: Not on file   Highest education level: Bachelor's degree (e.g., BA, AB, BS)  Occupational History   Not on file  Tobacco Use   Smoking status: Never   Smokeless tobacco: Never  Substance and Sexual Activity   Alcohol use: No   Drug use: No   Sexual activity: Yes  Other Topics Concern   Not on file  Social History Narrative   Caffienated drinks-yes   Seat belt use often-yes   Regular Exercise-no   Smoke alarm in the home-yes   Firearms/guns in the home-no   History of physical abuse-no               Social Drivers of Health   Financial Resource Strain: Low Risk  (06/10/2024)   Overall Financial Resource Strain (CARDIA)    Difficulty of Paying Living Expenses: Not very hard  Food Insecurity: No Food Insecurity (06/10/2024)   Hunger Vital Sign    Worried About Running Out of Food in the Last Year: Never true    Ran Out of Food in the Last Year: Never true  Transportation Needs: No Transportation Needs (06/10/2024)   PRAPARE - Administrator, Civil Service (Medical): No    Lack of Transportation  (Non-Medical): No  Physical Activity: Insufficiently Active (06/10/2024)   Exercise Vital Sign    Days of Exercise per Week: 1 day    Minutes of Exercise per Session: 30 min  Stress: Stress Concern Present (06/10/2024)   [employer] of Occupational Health - Occupational Stress Questionnaire    Feeling of Stress: To some extent  Social Connections: Moderately Integrated (06/10/2024)   Social Connection and Isolation Panel    Frequency of Communication with Friends and Family: Once a week    Frequency of Social Gatherings with Friends and Family: Never    Attends Religious Services: More than 4 times per year    Active Member of [employer] or Organizations: Yes    Attends Banker Meetings: More than 4 times per year    Marital  Status: Married  Catering Manager Violence: Not on file   Review of Systems  Constitutional: Negative.  Negative for chills and fever.  HENT:  Positive for congestion and sore throat.   Respiratory: Negative.  Negative for cough and shortness of breath.   Cardiovascular: Negative.  Negative for chest pain and palpitations.  Gastrointestinal:  Negative for abdominal pain, nausea and vomiting.  Skin: Negative.  Negative for rash.  Neurological:  Negative for dizziness and headaches.  All other systems reviewed and are negative.  Objective  Vitals as reported by the patient: There were no vitals filed for this visit. Problem List Items Addressed This Visit       Respiratory   Viral respiratory infection - Primary   Recent onset.  Started less than 48 hours ago Needs to be examined and tested for COVID and possible strep Advised to make appointment to be seen in the office this week Symptom management discussed        Other   Sore throat   Differential diagnosis discussed Needs office visit so he can be properly examined and tested Advised to contact the office to make an appointment this week       I discussed the assessment and  treatment plan with the patient. The patient was provided an opportunity to ask questions and all were answered. The patient agreed with the plan and demonstrated an understanding of the instructions.   The patient was advised to call back or seek an in-person evaluation if the symptoms worsen or if the condition fails to improve as anticipated.   Dr. Emil Schaumann, MD [facility]

## 2024-06-11 ENCOUNTER — Telehealth: Payer: Self-pay

## 2024-06-11 ENCOUNTER — Telehealth: Admitting: Physician Assistant

## 2024-06-11 DIAGNOSIS — J069 Acute upper respiratory infection, unspecified: Secondary | ICD-10-CM

## 2024-06-11 MED ORDER — IPRATROPIUM BROMIDE 0.03 % NA SOLN: 2.0000 | Freq: Two times a day (BID) | NASAL | 0 refills | Status: AC

## 2024-06-11 NOTE — Progress Notes (Unsigned)
° °  Acute Office Visit  Subjective:     Patient ID: Gary Gabrielsen, male    DOB: 27-Oct-1980, 43 y.o.   MRN: [id number]  No chief complaint on file.   HPI  Discussed the use of AI scribe software for clinical note transcription with the patient, who gave verbal consent to proceed.  History of Present Illness      ROS Per HPI      Objective:    There were no vitals taken for this visit.   Physical Exam Vitals and nursing note reviewed.  Constitutional:      General: He is not in acute distress.    Appearance: Normal appearance.  HENT:     Head: Normocephalic and atraumatic.     Right Ear: External ear normal.     Left Ear: External ear normal.     Nose: Nose normal.     Mouth/Throat:     Mouth: Mucous membranes are moist.     Pharynx: Oropharynx is clear.  Eyes:     Extraocular Movements: Extraocular movements intact.  Cardiovascular:     Rate and Rhythm: Normal rate and regular rhythm.     Pulses: Normal pulses.     Heart sounds: Normal heart sounds.  Pulmonary:     Effort: Pulmonary effort is normal. No respiratory distress.     Breath sounds: Normal breath sounds. No wheezing, rhonchi or rales.  Musculoskeletal:        General: Normal range of motion.     Cervical back: Normal range of motion.     Right lower leg: No edema.     Left lower leg: No edema.  Lymphadenopathy:     Cervical: No cervical adenopathy.  Skin:    General: Skin is warm and dry.  Neurological:     General: No focal deficit present.     Mental Status: He is alert and oriented to person, place, and time.  Psychiatric:        Mood and Affect: Mood normal.        Behavior: Behavior normal.     No results found for any visits on 06/12/24.      Assessment & Plan:   Assessment and Plan Assessment & Plan      No orders of the defined types were placed in this encounter.    No orders of the defined types were placed in this encounter.   No follow-ups on  file.  Corean LITTIE Ku, FNP

## 2024-06-11 NOTE — Telephone Encounter (Signed)
 Patient has been scheduled

## 2024-06-11 NOTE — Progress Notes (Signed)
 We are sorry you are not feeling well.  Here is how we plan to help!  Based on what you have shared with me, it looks like you may have a viral upper respiratory infection.  Upper respiratory infections are caused by a large number of viruses; however, rhinovirus is the most common cause. If you have upcoming travel especially, it is recommended you take a home COVID/Flu test as a precaution.  Symptoms vary from person to person, with common symptoms including sore throat, cough, and fatigue or lack of energy, and a feeling of general discomfort.  A low-grade fever of up to 100.4 may present, but is often uncommon.  Symptoms vary however, and are closely related to a person's age or underlying illnesses.  The most common symptoms associated with an upper respiratory infection are nasal discharge or congestion, cough, sneezing, headache and pressure in the ears and face.  These symptoms usually persist for about 3 to 10 days, but can last up to 2 weeks.  It is important to know that upper respiratory infections do not cause serious illness or complications in most cases.    Upper respiratory infections can be transmitted from person to person, with the most common method of transmission being a person's hands.  The virus is able to live on the skin and can infect other persons for up to 2 hours after direct contact.  Also, these can be transmitted when someone coughs or sneezes; thus, it is important to cover the mouth to reduce this risk.  To keep the spread of the illness at bay, good hand hygiene is very important!  Because this is a viral infection, there are no specific treatments other than to help you with the symptoms until the infection runs its course.    For nasal congestion, you may use an oral decongestants such as Mucinex D or if you have glaucoma or high blood pressure use plain Mucinex.  Saline nasal spray or nasal drops can help and can safely be used as often as needed for congestion.  For  your congestion, I have prescribed Ipratropium Bromide nasal spray 0.03% two sprays in each nostril 2-3 times a day  If you do not have a history of heart disease, hypertension, diabetes or thyroid  disease, prostate/bladder issues or glaucoma, you may also use Sudafed to treat nasal congestion.  It is highly recommended that you consult with a pharmacist or your primary care physician to ensure this medication is safe for you to take.     If you have a cough, you may use over-the-counter cough suppressants such as Delsym and Robitussin.  If you have glaucoma or high blood pressure, you can also use Coricidin HBP.     If you have a sore or scratchy throat, use a saltwater gargle-  to  teaspoon of salt dissolved in a 4-ounce to 8-ounce glass of warm water.  Gargle the solution for approximately 15-30 seconds and then spit.  It is important not to swallow the solution.  You can also use throat lozenges/cough drops and Chloraseptic spray to help with throat pain or discomfort.  Warm or cold liquids can also be helpful in relieving throat pain.  For headache, pain, or general discomfort, you can use Ibuprofen  or Tylenol  as directed.   Some authorities believe that zinc sprays or the use of Echinacea may shorten the course of your symptoms.   HOME CARE Only take medications as instructed by your medical team. Be sure to drink plenty  of fluids. Water is fine as well as fruit juices, sodas and electrolyte beverages. You may want to stay away from caffeine or alcohol. If you are nauseated, try taking small sips of liquids. How do you know if you are getting enough fluid? Your urine should be a pale yellow or almost colorless. Get rest. Taking a steamy shower or using a humidifier may help nasal congestion and ease sore throat pain. You can place a towel over your head and breathe in the steam from hot water coming from a faucet. Using a saline nasal spray works much the same way. Cough drops, hard candies  and sore throat lozenges may ease your cough. Avoid close contacts especially the very young and the elderly Cover your mouth if you cough or sneeze Always remember to wash your hands.   GET HELP RIGHT AWAY IF: You develop worsening fever. If your symptoms do not improve within 10 days You develop yellow or green discharge from your nose over 3 days. You have coughing fits You develop a severe head ache or visual changes. You develop shortness of breath, difficulty breathing or start having chest pain Your symptoms persist after you have completed your treatment plan  MAKE SURE YOU  Understand these instructions. Will watch your condition. Will get help right away if you are not doing well or get worse.  Your e-visit answers were reviewed by a board certified advanced clinical practitioner to complete your personal care plan. Depending upon the condition, your plan could have included both over-the-counter or prescription medications.  Please review your pharmacy choice. If there is a problem, you may call our nursing hot line at and have the prescription routed to another pharmacy. Your safety is important to us . If you have drug allergies, check your prescription carefully.   You can use MyChart to ask questions about todays visit, request a non-urgent call back, or ask for a work or school excuse for 24 hours related to this e-Visit. If it has been greater than 24 hours you will need to follow up with your provider, or enter a new e-Visit to address those concerns. You will get an e-mail in the next two days asking about your experience.  I hope that your e-visit has been valuable and will speed your recovery. Thank you for using e-visits.   I have spent 5 minutes in review of e-visit questionnaire, review and updating patient chart, medical decision making and response to patient.   Elsie Velma Lunger, PA-C

## 2024-06-11 NOTE — Telephone Encounter (Signed)
 He needs to be seen

## 2024-06-11 NOTE — Telephone Encounter (Signed)
 Copied from CRM #[id number]. Topic: Clinical - Medication Question >> Jun 11, 2024  9:57 AM Charolett L wrote: Reason for CRM: Patient wife is stating that they are going to [place]  and her husband has received a Z-Pak in the past and it worked better. She's requesting a call back asap because she feels as though the nurses are playing with his health. CB# [phone]

## 2024-06-12 ENCOUNTER — Other Ambulatory Visit (HOSPITAL_COMMUNITY): Payer: Self-pay

## 2024-06-12 ENCOUNTER — Ambulatory Visit: Admitting: Family Medicine

## 2024-06-12 ENCOUNTER — Encounter: Payer: Self-pay | Admitting: Family Medicine

## 2024-06-12 VITALS — BP 136/78 | HR 99 | Temp 98.5°F | Ht 77.0 in | Wt 335.6 lb

## 2024-06-12 DIAGNOSIS — E1165 Type 2 diabetes mellitus with hyperglycemia: Secondary | ICD-10-CM

## 2024-06-12 DIAGNOSIS — J029 Acute pharyngitis, unspecified: Secondary | ICD-10-CM

## 2024-06-12 DIAGNOSIS — B9689 Other specified bacterial agents as the cause of diseases classified elsewhere: Secondary | ICD-10-CM

## 2024-06-12 LAB — POCT INFLUENZA A/B
Influenza A, POC: NEGATIVE
Influenza B, POC: NEGATIVE

## 2024-06-12 LAB — POC COVID19 BINAXNOW: SARS Coronavirus 2 Ag: NEGATIVE

## 2024-06-12 MED ORDER — AZITHROMYCIN 250 MG PO TABS
ORAL_TABLET | ORAL | 0 refills | Status: AC
  Filled 2024-06-12: qty 6, 5d supply, fill #0

## 2024-06-12 NOTE — Patient Instructions (Addendum)
 I have sent in azithromycin  for you to take. Take 2 tablets today, then 1 tablet daily for the next 4 days.  COVID and flu testing are negative.   Follow-up with me for new or worsening symptoms.

## 2024-06-12 NOTE — Telephone Encounter (Signed)
Patient was scheduled for an office visit

## 2024-07-01 ENCOUNTER — Other Ambulatory Visit (HOSPITAL_COMMUNITY): Payer: Self-pay

## 2024-07-01 ENCOUNTER — Ambulatory Visit: Admitting: Endocrinology

## 2024-07-01 ENCOUNTER — Encounter: Payer: Self-pay | Admitting: Endocrinology

## 2024-07-01 VITALS — BP 136/82 | HR 78 | Resp 16 | Ht 77.0 in | Wt 337.0 lb

## 2024-07-01 DIAGNOSIS — E1165 Type 2 diabetes mellitus with hyperglycemia: Secondary | ICD-10-CM

## 2024-07-01 DIAGNOSIS — Z794 Long term (current) use of insulin: Secondary | ICD-10-CM

## 2024-07-01 MED ORDER — TIRZEPATIDE 10 MG/0.5ML ~~LOC~~ SOAJ
10.0000 mg | SUBCUTANEOUS | 3 refills | Status: AC
  Filled 2024-07-01: qty 6, 84d supply, fill #0

## 2024-07-01 MED ORDER — TRESIBA FLEXTOUCH 200 UNIT/ML ~~LOC~~ SOPN
160.0000 [IU] | PEN_INJECTOR | Freq: Every day | SUBCUTANEOUS | 3 refills | Status: AC
  Filled 2024-07-01: qty 72, 90d supply, fill #0

## 2024-07-01 MED ORDER — DEXCOM G7 SENSOR MISC
1.0000 | 3 refills | Status: AC
  Filled 2024-07-01: qty 9, 90d supply, fill #0

## 2024-07-01 MED ORDER — DAPAGLIFLOZIN PRO-METFORMIN ER 10-1000 MG PO TB24
1.0000 | ORAL_TABLET | Freq: Every day | ORAL | 3 refills | Status: AC
  Filled 2024-07-01: qty 90, 90d supply, fill #0

## 2024-07-01 NOTE — Progress Notes (Signed)
 "  Outpatient Endocrinology Note Dayquan Buys, MD   Patient's Name: Stephen Hall    DOB: 08-23-1980    MRN: [id number]                                                    REASON OF VISIT: Follow up for type 2 diabetes mellitus  PCP: Joshua Debby CROME, MD  HISTORY OF PRESENT ILLNESS:   Stephen Hall is a 43 y.o. old male with past medical history listed below, is here for follow up for type 2 diabetes mellitus.   Pertinent Diabetes History: Patient was previously seen by Dr. Von and was last time seen in August 2024.  Patient was diagnosed with type 2 diabetes mellitus in 2015.  Insulin  therapy was started in 2017.  He has uncontrolled type 2 diabetes mellitus.  No personal history of pancreatitis and / or family history of medullary thyroid  carcinoma or MEN 2B syndrome.   Chronic Diabetes Complications : Retinopathy: no. Last ophthalmology exam was done on 12/2022, following with ophthalmology regularly.  Nephropathy: no, on ACE/ARB /candesartan  Peripheral neuropathy: no Coronary artery disease: no Stroke: no  Relevant comorbidities and cardiovascular risk factors: Obesity: yes Body mass index is 39.96 kg/m.  Also following with bariatric surgery, considering bariatric surgery. Hypertension: Yes  Hyperlipidemia : Yes, on statin   Current / Home Diabetic regimen includes:  Non-insulin  hypoglycemic drugs: Mounjaro  10 mg weekly, Xigduo  10/998 mg 1 tablet daily     Insulin  regimen: 160 units of U-200 Tresiba  daily, NovoLog  30 units before meals usually 2 - 3 times a day using Cequr you, taking with lunch and supper.    Prior diabetic medications: Soliqua , Lantus  insulin , Humalog  , Prandin ,? Metformin .  GI upset with higher dose of Mounjaro .  Glycemic data:    CONTINUOUS GLUCOSE MONITORING SYSTEM (CGMS) INTERPRETATION: At today's visit, we reviewed CGM downloads. The full report is scanned in the media. Reviewing the CGM trends, blood glucose are as follows:  Dexcom G7 CGM-   Sensor Download (Sensor download was reviewed and summarized below.) Dates: December 18 - July 01, 2024, 14 days  Sensor use is 79%  Glucose Management Indicator: 8.3%    Previous :    Previous :    Interpretation: Significant hyperglycemia postprandially related to meals during the daytime with blood sugar up to 300-350 range.  Mostly acceptable blood sugar overnight and in acceptable range.  Few of the days acceptable blood sugar throughout the day.  No hypoglycemia.  Hypoglycemia: Patient has no hypoglycemic episodes. Patient has hypoglycemia awareness.  Factors modifying glucose control: 1.  Diabetic diet assessment: 3 meals a day.  Drinking regular sodas.  Frequent fast food.  2.  Staying active or exercising: No formal exercise.  3.  Medication compliance: compliant most of the time.  Other: He has obstructive sleep apnea and on CPAP.  Interval history : CGM data as reviewed above.  Still with postprandial hyperglycemia.  Hemoglobin A1c was 10.2% in end of October.  GMI on CGM 8.3%.  Patient reports he had also taken a steroid to around mid of December for back pain.  He reports he has not been as good on diet during holiday time.  He reports taking insulin  30 units and NovoLog  for meals using pen, he is not using CeQur.  He has also been following with dietitian  and pharmacist.  No other complaints today.  REVIEW OF SYSTEMS As per history of present illness.   PAST MEDICAL HISTORY: Past Medical History:  Diagnosis Date   Asthma    Diabetes mellitus without complication (HCC)    Environmental allergies    Hyperlipidemia    Hypertension     PAST SURGICAL HISTORY: History reviewed. No pertinent surgical history.  ALLERGIES: No Known Allergies  FAMILY HISTORY:  Family History  Problem Relation Age of Onset   Alcohol abuse Other    Arthritis Other    Hypertension Other    Diabetes Other    Obesity Mother    Hypertension Mother    Obesity Father     Hypertension Father    Cancer Neg Hx    Heart disease Neg Hx    Stroke Neg Hx    Kidney disease Neg Hx     SOCIAL HISTORY: Social History   Socioeconomic History   Marital status: Married    Spouse name: Not on file   Number of children: Not on file   Years of education: Not on file   Highest education level: Bachelor's degree (e.g., BA, AB, BS)  Occupational History   Not on file  Tobacco Use   Smoking status: Never   Smokeless tobacco: Never  Substance and Sexual Activity   Alcohol use: No   Drug use: No   Sexual activity: Yes  Other Topics Concern   Not on file  Social History Narrative   Caffienated drinks-yes   Seat belt use often-yes   Regular Exercise-no   Smoke alarm in the home-yes   Firearms/guns in the home-no   History of physical abuse-no               Social Drivers of Health   Tobacco Use: Low Risk (07/01/2024)   Patient History    Smoking Tobacco Use: Never    Smokeless Tobacco Use: Never    Passive Exposure: Not on file  Financial Resource Strain: Low Risk (06/10/2024)   Overall Financial Resource Strain (CARDIA)    Difficulty of Paying Living Expenses: Not very hard  Food Insecurity: No Food Insecurity (06/10/2024)   Epic    Worried About Radiation Protection Practitioner of Food in the Last Year: Never true    Ran Out of Food in the Last Year: Never true  Transportation Needs: No Transportation Needs (06/10/2024)   Epic    Lack of Transportation (Medical): No    Lack of Transportation (Non-Medical): No  Physical Activity: Insufficiently Active (06/10/2024)   Exercise Vital Sign    Days of Exercise per Week: 1 day    Minutes of Exercise per Session: 30 min  Stress: Stress Concern Present (06/10/2024)   [employer] of Occupational Health - Occupational Stress Questionnaire    Feeling of Stress: To some extent  Social Connections: Moderately Integrated (06/10/2024)   Social Connection and Isolation Panel    Frequency of Communication with Friends  and Family: Once a week    Frequency of Social Gatherings with Friends and Family: Never    Attends Religious Services: More than 4 times per year    Active Member of Clubs or Organizations: Yes    Attends Banker Meetings: More than 4 times per year    Marital Status: Married  Depression (PHQ2-9): Medium Risk (01/28/2024)   Depression (PHQ2-9)    PHQ-2 Score: 8  Alcohol Screen: Low Risk (06/10/2024)   Alcohol Screen    Last Alcohol Screening Score (AUDIT):  1  Housing: Low Risk (06/10/2024)   Epic    Unable to Pay for Housing in the Last Year: No    Number of Times Moved in the Last Year: 0    Homeless in the Last Year: No  Utilities: Not on file  Health Literacy: Not on file    MEDICATIONS:  Current Outpatient Medications  Medication Sig Dispense Refill   albuterol  (VENTOLIN  HFA) 108 (90 Base) MCG/ACT inhaler Inhale 2 puffs into the lungs every 6 (six) hours as needed for wheezing or shortness of breath. 7.5 g 0   amLODipine -olmesartan  (AZOR ) 5-20 MG tablet Take 1 tablet by mouth daily. To replace amlodipine  and candesartan  90 tablet 0   Bempedoic Acid -Ezetimibe  (NEXLIZET ) 180-10 MG TABS Take 1 tablet by mouth daily. 90 tablet 1   Continuous Glucose Sensor (DEXCOM G7 SENSOR) MISC Apply 1 sensor to back of upper arm every 10 days. 3 each 3   Dapagliflozin  Pro-metFORMIN  ER (XIGDUO  XR) 04-999 MG TB24 Take 1 tablet by mouth daily. 90 tablet 3   Ferric Maltol  (ACCRUFER ) 30 MG CAPS Take 1 capsule (30 mg total) by mouth in the morning and at bedtime. 180 capsule 0   Glucagon  (GVOKE HYPOPEN  2-PACK) 1 MG/0.2ML SOAJ Inject 1 pen into the skin daily as needed for low blood sugar 0.4 mL 5   indapamide  (LOZOL ) 1.25 MG tablet Take 1 tablet (1.25 mg total) by mouth daily. 90 tablet 0   injection device for insulin  (CEQUR SIMPLICITY 2U) DEVI Change every 2 days , take 15 clicks before meals 3 times a day. 16 each 11   Injection Device for Insulin  (CEQUR SIMPLICITY INSERTER) MISC Use as  advised 1 each 0   insulin  aspart (NOVOLOG ) 100 UNIT/ML injection Use up to 90 units / day via Cequr device. 50 mL 3   insulin  degludec (TRESIBA  FLEXTOUCH) 200 UNIT/ML FlexTouch Pen Inject 160 Units into the skin daily. 81 mL 3   Insulin  Pen Needle 32G X 6 MM MISC Use as directed morning, noon, in the evening and at bedtime. 300 each 3   ipratropium (ATROVENT ) 0.03 % nasal spray Place 2 sprays into both nostrils every 12 (twelve) hours. 30 mL 0   Lancets (ONETOUCH DELICA PLUS LANCET33G) MISC USE TO CHECK BLOOD GLUCOSE THREE TIMES DAILY 100 each 1   rosuvastatin  (CRESTOR ) 10 MG tablet Take 1 tablet (10 mg total) by mouth daily. 90 tablet 1   sildenafil  (VIAGRA ) 100 MG tablet Take 1 tablet (100 mg total) by mouth daily as needed for erectile dysfunction 30 tablet 3   tadalafil  (CIALIS ) 5 MG tablet Take 1 tablet (5 mg total) by mouth daily. 90 tablet 3   thiamine  (VITAMIN B-1) 50 MG tablet Take 1 tablet (50 mg total) by mouth daily. 100 tablet 1   tirzepatide  (MOUNJARO ) 10 MG/0.5ML Pen Inject 10 mg into the skin once a week. 9 mL 3   No current facility-administered medications for this visit.    PHYSICAL EXAM: Vitals:   07/01/24 0820  BP: 136/82  Pulse: 78  Resp: 16  SpO2: 97%  Weight: (!) 337 lb (152.9 kg)  Height: 6' 5 (1.956 m)     Body mass index is 39.96 kg/m.  Wt Readings from Last 3 Encounters:  07/01/24 (!) 337 lb (152.9 kg)  06/12/24 (!) 335 lb 9.6 oz (152.2 kg)  04/29/24 (!) 325 lb 12.8 oz (147.8 kg)    General: Well developed, well nourished male in no apparent distress.  HEENT: AT/[place], no  external lesions.  Eyes: Conjunctiva clear and no icterus. Neck: Neck supple  Lungs: Respirations not labored Neurologic: Alert, oriented, normal speech Extremities / Skin: Dry.  Psychiatric: Does not appear depressed or anxious  Diabetic Foot Exam - Simple   No data filed    LABS Reviewed Lab Results  Component Value Date   HGBA1C 10.2 (H) 04/29/2024   HGBA1C 7.8  01/28/2024   HGBA1C 8.9 (A) 10/25/2023   Lab Results  Component Value Date   FRUCTOSAMINE 316 (H) 03/07/2022   FRUCTOSAMINE 275 10/26/2020   FRUCTOSAMINE 209 07/23/2016   Lab Results  Component Value Date   CHOL 136 04/29/2024   HDL 39.80 04/29/2024   LDLCALC 80 04/29/2024   LDLDIRECT 158.1 02/07/2012   TRIG 80.0 04/29/2024   CHOLHDL 3 04/29/2024   Lab Results  Component Value Date   MICRALBCREAT Unable to calculate 09/23/2023   Lab Results  Component Value Date   CREATININE 0.96 04/29/2024   Lab Results  Component Value Date   GFR 97.82 04/29/2024    ASSESSMENT / PLAN  1. Uncontrolled type 2 diabetes mellitus with hyperglycemia, with long-term current use of insulin  (HCC)    Diabetes Mellitus type 2, complicated by no known complications. - Diabetic status / severity: Uncontrolled.   Lab Results  Component Value Date   HGBA1C 10.2 (H) 04/29/2024    - Hemoglobin A1c goal : <6.5%  Discussed about importance of controlling diabetes to prevent chronic complications including diabetic retinopathy, neuropathy and nephropathy.  Discussed about compliance with diabetes regimen.  Discussed about taking NovoLog  with each meals breakfast, lunch and supper and asked to take 10 to 15 minutes before.   Encouraged to use CeQur for delivery of NovoLog  with meals.  Discussed about increasing dose of Mounjaro , will help to require low-dose of insulin  and also help to lose weight.  Patient wants to stay on current dose for now.  Adjusted diabetes regimen as follows.  - Medications: See below  I) continue Tresiba  160 units daily. II) advised to take NovoLog  from 30 -40 units with meals 3 times a day, 10 to 15 minutes before eating.  15-20 clicks using CeQur device.  Encouraged to use CeQur device.  Advised to take NovoLog  40 units for heavy meal.  He has been still using leftover NovoLog  pen. III) continue Mounjaro  10 mg weekly. IV) continue Xigduo  10/998 mg 1 tablet  daily  - Home glucose testing: Dexcom G7 and check as needed.  - Discussed/ Gave Hypoglycemia treatment plan.  # Consult : not required at this time.   # Annual urine for microalbuminuria/ creatinine ratio, no microalbuminuria currently, continue ACE/ARB / candesartan . Last  Lab Results  Component Value Date   MICRALBCREAT Unable to calculate 09/23/2023    # Foot check nightly.  # Annual dilated diabetic eye exams.   - Diet: Make healthy diabetic food choices.  Discussed in detail.  Asked to avoid regular soft drinks.  Asked to limit carbohydrates and fast food. - Life style / activity / exercise: Discussed.  2. Blood pressure  -  BP Readings from Last 1 Encounters:  07/01/24 136/82    - Control is in target.  - No change in current plans.  3. Lipid status / Hyperlipidemia - Last  Lab Results  Component Value Date   LDLCALC 80 04/29/2024   - Continue Nexlizet .  Previously on rosuvastatin .  Managed by primary care provider.  # Morbid obesity : Was previously referred to bariatric surgery /bariatric center for  potential bariatric surgery.  Patient is asked to call the clinic and make appointment.  Bariatric surgery requested form completed and provided to the patient in the clinic the prior visit.  He had seen bariatric surgeon in May.  Diagnoses and all orders for this visit:  Uncontrolled type 2 diabetes mellitus with hyperglycemia, with long-term current use of insulin  (HCC)      DISPOSITION Follow up in clinic in 3 months suggested.   All questions answered and patient verbalized understanding of the plan.  Alandis Bluemel, MD [facility] Endocrinology [facility] [address] [place], [address] [place], [place] [address] Phone # [phone]  At least part of this note was generated using voice recognition software. Inadvertent word errors may have occurred, which were not recognized during the proofreading process. "

## 2024-07-03 ENCOUNTER — Other Ambulatory Visit: Payer: Self-pay

## 2024-07-03 ENCOUNTER — Other Ambulatory Visit (HOSPITAL_COMMUNITY): Payer: Self-pay

## 2024-07-06 ENCOUNTER — Other Ambulatory Visit: Payer: Self-pay

## 2024-07-13 ENCOUNTER — Other Ambulatory Visit: Admitting: Pharmacist

## 2024-07-13 DIAGNOSIS — E119 Type 2 diabetes mellitus without complications: Secondary | ICD-10-CM

## 2024-07-13 NOTE — Progress Notes (Signed)
 "  07/13/2024 Name: Stephen Hall MRN: [id number] DOB: [birth year]  Chief Complaint  Patient presents with   Diabetes   Medication Management    [name] is a 44 y.o. year old male who presented for a in office visit.   They were referred to the pharmacist by their PCP for assistance in managing diabetes and hypertension.   Subjective:  Care Team: Primary Care Provider: Joshua Debby CROME, MD ; Next Scheduled Visit: 08/13/23 Endocrinologist Dr. Mercie; Next Scheduled Visit: 07/01/24  Medication Access/Adherence  Current Pharmacy:  [facility] - [facility] [address] [place] [address] Phone: [phone] Fax: [phone]   Patient reports affordability concerns with their medications: Yes  Patient reports access/transportation concerns to their pharmacy: No  Patient reports adherence concerns with their medications:  No    Diabetes: *Followed by endo - Dr. Mercie Current medications: Xigduo  XR 04/999 mg daily, Mounjaro  10 mg weekly (Thursdays), Tresiba  160 units per day in AM, Novolog  30 units TID AC using CeQur insulin  patch  Has not been using CeQur insulin  patch because he wants to use up his remaining insulin  pen needles.  Medications tried in the past: Mounjaro  12.5 mg caused GI upset  Current glucose readings: Using Dexcom G7   Previous:     Current meal patterns:  Pt describes his diet as terrible because he is always eating on the go. Has tried to cut back on carbs and eat smaller portion but notes the overall food choices are not ideal.   Eats 3-4 meals/day Breakfast - leftovers; meat, juice or soda Lunch - something similar to breakfast; adds tomato, lettuce, or something green; sweet tea Afternoon snack - whatever he can find Dinner - heartiest meal Drinks: Takes meds with juice, no other juice per day Sweet tea w/ lunch Down to one soda per day (17 oz)  Current physical activity: has been swimming and  exercising  Current medication access support: copay cards for Xigduo , Nexlizet , and Mounjaro   Hypertension: Current medications: amlodipine -olmesartan  5-20 mg daily, indapamide  1.25 mg daily PCP recently discontinued indapamide  due to BP overcontrolled (BP was 124/82 at OV)  Patient does have a validated, automated, upper arm home BP cuff.   Denies s/sx hypotension  Of note, patient is going through the process to get approval for weight loss surgery  Objective:  BP Readings from Last 3 Encounters:  07/01/24 136/82  06/12/24 136/78  06/08/24 138/85     Lab Results  Component Value Date   HGBA1C 10.2 (H) 04/29/2024    Lab Results  Component Value Date   CREATININE 0.96 04/29/2024   BUN 12 04/29/2024   NA 137 04/29/2024   K 4.4 04/29/2024   CL 100 04/29/2024   CO2 27 04/29/2024    Lab Results  Component Value Date   CHOL 136 04/29/2024   HDL 39.80 04/29/2024   LDLCALC 80 04/29/2024   LDLDIRECT 158.1 02/07/2012   TRIG 80.0 04/29/2024   CHOLHDL 3 04/29/2024    Medications Reviewed Today   Medications were not reviewed in this encounter     Assessment/Plan:   Diabetes: - Currently uncontrolled, A1c goal <7% - Average on Dexcom has improved since last appt. GMI last 2 weeks 8.1%. Main issue is medication adherence and lifestyle. - Reviewed goal A1c, goal fasting, and goal 2 hour post prandial glucose - Reviewed dietary modifications including eliminating high sugar beverages such as sodas and juices, encouraged increased protein/fiber and balanced meals/snacks - Discussed main goal to  help lower highs will be to significantly decrease or eliminate sweet tea/soda - Reviewed lifestyle modifications including: increased movement/walking, strength training as able - Reviewed proper use of insulin . Novolog  to be taken 10-15 minutes prior to eating, which may help prevent high BG spikes after eating and low BGs later on. - Recommend to continue Novolog  to 30 units  TID before meals. Continue Tresiba , Mounjaro , Xigduo  - Recommend to continue focus on timing of Novolog  doses and adherence with Novolog  and Xigduo . - Encouraged to continue using CeQur insulin  patch for improved Novolog  adherence  Med access: Xigduo  copay card at [facility] [id number] PCN: [id number], GRP: [id number], ID: [id number]  Nexlizet  - already re-enrolled pt in copay card program, using same copay card as instructed  Appears Mounjaro  already has a copay card and his insulins are $0    Follow Up Plan: 3 weeks   [name], PharmD, BCPS, CPP Clinical Pharmacist Practitioner [facility] [phone] "

## 2024-07-13 NOTE — Patient Instructions (Signed)
 It was a pleasure speaking with you today!  Work on reducing or eliminating sweet tea and soda to help with your high sugar spikes.  Continue current medication regimen.  Feel free to call with any questions or concerns!  [name], PharmD, BCPS, CPP Clinical Pharmacist Practitioner [facility] [phone]

## 2024-07-23 ENCOUNTER — Encounter: Attending: General Surgery | Admitting: Dietician

## 2024-07-23 ENCOUNTER — Encounter: Payer: Self-pay | Admitting: Dietician

## 2024-07-23 VITALS — Ht 77.0 in | Wt 332.4 lb

## 2024-07-23 DIAGNOSIS — E669 Obesity, unspecified: Secondary | ICD-10-CM | POA: Insufficient documentation

## 2024-07-23 NOTE — Progress Notes (Signed)
 Supervised Weight Loss Visit Bariatric Nutrition Education Appt Start Time: 1655    End Time: 1722  Planned surgery: Sleeve Gastrectomy Pt expectation of surgery: feel better weighing 100 lbs less  Referral stated Supervised Weight Loss (SWL) visits needed: 12  5 out of 12 SWL Appointments   NUTRITION ASSESSMENT Anthropometrics  Start weight at [facility]: 347.0 lbs (date: 11/22/2023)  Height: 77 in Weight: 332.4 lbs BMI: 39.42 kg/m2     Clinical   Pharmacotherapy: History of weight loss medication used: Mounjaro  (tolerates well), saxenda  (not tolerated)  Medical hx: T2DM, HTN, High Cholesterol, obesity, sleep apnea Medications: monjauro, metformin , novolog , tresiba , candesartan , rosuvastatin , vit B Labs: Vit D 14.4; iron 35; iron saturation <8; UIBC 390 Notable signs/symptoms: none noted Any previous deficiencies? No  Lifestyle & Dietary Hx It has been almost 6 months since patient's last visit. Pt states he has been through a lot, stating he is back now. Pt states he needs to start from the beginning again. Pt states he is still taking Mounjaro , stating it takes effort to drink fluids when taking Mounjaro . Pt states food  Pt agreeable to schedule meals and snacks, to help avoid skipping meals.  Estimated daily fluid intake: 64 oz Supplements: Vit B Current average weekly physical activity: play basketball with son, some light weights randomly  24-Hr Dietary Recall First Meal: biscuit (sausage), Dr. Nunzio zero Snack:  Second Meal: water, green beans fried chicken Snack:  Third Meal: chick fil a sandwich Snack: popcorn, soda Beverages: soda, juice, water  Alcoholic beverages per week: 0  Estimated Energy Needs Calories: 1900  NUTRITION DIAGNOSIS  Overweight/obesity ([place]-3.3) related to past poor dietary habits and physical inactivity as evidenced by patient w/ planned sleeve surgery following dietary guidelines for continued weight loss.  NUTRITION INTERVENTION   Nutrition counseling (C-1) and education (E-2) to facilitate bariatric surgery goals.  Establishing a consistent schedule for meals and snacks before bariatric surgery helps stabilize hunger, prevent overeating, and build the structured eating pattern youll rely on afterward. Avoiding skipped meals keeps your energy steady and supports better blood sugar control, making it easier to meet your nutrition goals throughout the day. Using the Bariatric MyPlate as your guide--prioritizing protein first, filling most of the remaining space with non-starchy vegetables, and adding small portions of healthy carbohydrates--creates balanced, nutrient-dense meals that prepare you for the portion sizes and habits needed after surgery. This routine becomes the foundation for long-term success and smoother postoperative transitions.  Getting enough protein before and after bariatric surgery is essential for healing, preserving muscle mass, and supporting long-term weight loss. Aim to include a protein-rich food with every meal and snack--such as eggs, lean meats, dairy, or plant-based options--and track your intake to reach a daily goal of 80 grams. Building this habit before surgery helps your body adjust and makes it easier to meet your nutritional needs after surgery, when portion sizes are smaller and protein becomes even more critical.  Pre-Op Goals Reviewed with the Patient  Pre-Op Goals Progress & New Goals Continue: start taking vitamin D ; take 5,000 international units per day. Continue: track fluids; aim for 64 oz of hydrating fluids Continue: find a protein shake you like Continue: cut out grazing between meals and at night. Schedule meals and snacks. Re-engage: practice not drinking with meals; take small bites and chew well; slow down at meal time, take 20-30 minutes eat. Continue: focus on complex carbohydrates; replace more processed foods with whole grains, fruit and starchy vegetables. Re-engage:  track your protein;  aim for 80 grams per day; include a protein with every meal or snack New (re-engage): meal planning a prepping meals and snacks; schedule meals and snacks; aim for a protein with every meal or snack.  Handouts Provided Include  Bariatric MyPlate  Learning Style & Readiness for Change Teaching method utilized: Visual & Auditory  Demonstrated degree of understanding via: Teach Back  Readiness Level: preparation Barriers to learning/adherence to lifestyle change: nothing identified  RD's Notes for next Visit  Patient progress toward chosen goals  MONITORING & EVALUATION Dietary intake, weekly physical activity, body weight, and pre-op goals.  Next Steps  Patient is to return to [facility] in 2 for next SWL visit.

## 2024-08-03 ENCOUNTER — Other Ambulatory Visit

## 2024-08-03 DIAGNOSIS — E119 Type 2 diabetes mellitus without complications: Secondary | ICD-10-CM

## 2024-08-03 NOTE — Progress Notes (Unsigned)
 "  08/03/2024 Name: Stephen Hall MRN: [id number] DOB: 19-Dec-1980  No chief complaint on file.   Will Heinkel is a 44 y.o. year old male who presented for a in office visit.   They were referred to the pharmacist by their PCP for assistance in managing diabetes and hypertension.   Subjective:  Care Team: Primary Care Provider: Joshua Debby CROME, MD ; Next Scheduled Visit: 08/13/23 Endocrinologist Dr. Mercie; Next Scheduled Visit: 07/01/24  Medication Access/Adherence  Current Pharmacy:  [facility] - [facility] [address] [place] [address] Phone: [phone] Fax: [phone]   Patient reports affordability concerns with their medications: Yes  Patient reports access/transportation concerns to their pharmacy: No  Patient reports adherence concerns with their medications:  No    Diabetes: *Followed by endo - Dr. Mercie Current medications: Xigduo  XR 04/999 mg daily, Mounjaro  10 mg weekly (Thursdays), Tresiba  160 units per day in AM, Novolog  30 units TID AC using CeQur insulin  patch  Has not been using CeQur insulin  patch because he wants to use up his remaining insulin  pen needles.  Medications tried in the past: Mounjaro  12.5 mg caused GI upset  Current glucose readings: Using Dexcom G7   Previous:     Current meal patterns:  Pt describes his diet as terrible because he is always eating on the go. Has tried to cut back on carbs and eat smaller portion but notes the overall food choices are not ideal.   Eats 3-4 meals/day Breakfast - leftovers; meat, juice or soda Lunch - something similar to breakfast; adds tomato, lettuce, or something green; sweet tea Afternoon snack - whatever he can find Dinner - heartiest meal Drinks: Takes meds with juice, no other juice per day Sweet tea w/ lunch Down to one soda per day (17 oz)  Current physical activity: has been swimming and exercising  Current medication access support: copay cards  for Xigduo , Nexlizet , and Mounjaro   Hypertension: Current medications: amlodipine -olmesartan  5-20 mg daily, indapamide  1.25 mg daily PCP recently discontinued indapamide  due to BP overcontrolled (BP was 124/82 at OV)  Patient does have a validated, automated, upper arm home BP cuff.   Denies s/sx hypotension  Of note, patient is going through the process to get approval for weight loss surgery  Objective:  BP Readings from Last 3 Encounters:  07/01/24 136/82  06/12/24 136/78  06/08/24 138/85     Lab Results  Component Value Date   HGBA1C 10.2 (H) 04/29/2024    Lab Results  Component Value Date   CREATININE 0.96 04/29/2024   BUN 12 04/29/2024   NA 137 04/29/2024   K 4.4 04/29/2024   CL 100 04/29/2024   CO2 27 04/29/2024    Lab Results  Component Value Date   CHOL 136 04/29/2024   HDL 39.80 04/29/2024   LDLCALC 80 04/29/2024   LDLDIRECT 158.1 02/07/2012   TRIG 80.0 04/29/2024   CHOLHDL 3 04/29/2024    Medications Reviewed Today   Medications were not reviewed in this encounter     Assessment/Plan:   Diabetes: - Currently uncontrolled, A1c goal <7% - Average on Dexcom has improved since last appt. GMI last 2 weeks 8.1%. Main issue is medication adherence and lifestyle. - Reviewed goal A1c, goal fasting, and goal 2 hour post prandial glucose - Reviewed dietary modifications including eliminating high sugar beverages such as sodas and juices, encouraged increased protein/fiber and balanced meals/snacks - Discussed main goal to help lower highs will be to significantly decrease or  eliminate sweet tea/soda - Reviewed lifestyle modifications including: increased movement/walking, strength training as able - Reviewed proper use of insulin . Novolog  to be taken 10-15 minutes prior to eating, which may help prevent high BG spikes after eating and low BGs later on. - Recommend to continue Novolog  to 30 units TID before meals. Continue Tresiba , Mounjaro , Xigduo  -  Recommend to continue focus on timing of Novolog  doses and adherence with Novolog  and Xigduo . - Encouraged to continue using CeQur insulin  patch for improved Novolog  adherence  Med access: Xigduo  copay card at [facility] [id number] PCN: [id number], GRP: [id number], ID: [id number]  Nexlizet  - already re-enrolled pt in copay card program, using same copay card as instructed  Appears Mounjaro  already has a copay card and his insulins are $0    Follow Up Plan: 3 weeks   [name], PharmD, BCPS, CPP Clinical Pharmacist Practitioner [facility] [phone] "

## 2024-08-05 NOTE — Patient Instructions (Signed)
 It was a pleasure speaking with you today!  Continue current regimen. Continue working on diet and taking medications regularly. We will consider trying increased Mounjaro  in the future.  Feel free to call with any questions or concerns!  [name], PharmD, BCPS, CPP Clinical Pharmacist Practitioner [facility] [phone]

## 2024-08-12 ENCOUNTER — Ambulatory Visit: Admitting: Internal Medicine

## 2024-08-17 ENCOUNTER — Other Ambulatory Visit

## 2024-09-23 ENCOUNTER — Ambulatory Visit: Admitting: Endocrinology
